# Patient Record
Sex: Female | Born: 2000 | Race: White | Hispanic: No | Marital: Single | Smoking: Never smoker
Health system: Southern US, Community
[De-identification: ages and names within clinical notes are randomized; demographics above are authoritative.]

## PROBLEM LIST (undated history)

## (undated) ENCOUNTER — Emergency Department: Payer: PRIVATE HEALTH INSURANCE | Source: Home / Self Care

## (undated) DIAGNOSIS — F323 Major depressive disorder, single episode, severe with psychotic features: Secondary | ICD-10-CM

## (undated) DIAGNOSIS — F419 Anxiety disorder, unspecified: Secondary | ICD-10-CM

## (undated) DIAGNOSIS — F429 Obsessive-compulsive disorder, unspecified: Secondary | ICD-10-CM

## (undated) DIAGNOSIS — F1220 Cannabis dependence, uncomplicated: Secondary | ICD-10-CM

## (undated) DIAGNOSIS — F32A Depression, unspecified: Secondary | ICD-10-CM

## (undated) DIAGNOSIS — F329 Major depressive disorder, single episode, unspecified: Secondary | ICD-10-CM

## (undated) DIAGNOSIS — T7840XA Allergy, unspecified, initial encounter: Secondary | ICD-10-CM

## (undated) DIAGNOSIS — F4010 Social phobia, unspecified: Secondary | ICD-10-CM

---
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
## 2018-03-03 ENCOUNTER — Other Ambulatory Visit: Payer: Self-pay

## 2018-03-03 ENCOUNTER — Encounter: Payer: Self-pay | Admitting: *Deleted

## 2018-03-03 ENCOUNTER — Encounter: Payer: Self-pay | Admitting: Psychiatry

## 2018-03-03 ENCOUNTER — Inpatient Hospital Stay
Admission: AD | Admit: 2018-03-03 | Discharge: 2018-03-08 | DRG: 885 | Disposition: A | Discharge status: Home / Self Care | Payer: 59 | Source: Ambulatory Visit | Attending: Psychiatry | Admitting: Psychiatry

## 2018-03-03 ENCOUNTER — Emergency Department
Admission: EM | Admit: 2018-03-03 | Discharge: 2018-03-03 | Disposition: A | Discharge status: Other Acute Inpatient Hospital | Payer: Managed Care, Other (non HMO) | Attending: Emergency Medicine | Admitting: Emergency Medicine

## 2018-03-03 DIAGNOSIS — F4310 Post-traumatic stress disorder, unspecified: Secondary | ICD-10-CM | POA: Diagnosis present

## 2018-03-03 DIAGNOSIS — R45850 Homicidal ideations: Secondary | ICD-10-CM | POA: Diagnosis present

## 2018-03-03 DIAGNOSIS — Z818 Family history of other mental and behavioral disorders: Secondary | ICD-10-CM | POA: Diagnosis not present

## 2018-03-03 DIAGNOSIS — F332 Major depressive disorder, recurrent severe without psychotic features: Secondary | ICD-10-CM

## 2018-03-03 DIAGNOSIS — Z23 Encounter for immunization: Secondary | ICD-10-CM | POA: Diagnosis not present

## 2018-03-03 DIAGNOSIS — F1220 Cannabis dependence, uncomplicated: Secondary | ICD-10-CM | POA: Diagnosis present

## 2018-03-03 DIAGNOSIS — Z915 Personal history of self-harm: Secondary | ICD-10-CM | POA: Diagnosis not present

## 2018-03-03 DIAGNOSIS — R45851 Suicidal ideations: Secondary | ICD-10-CM | POA: Diagnosis present

## 2018-03-03 DIAGNOSIS — F429 Obsessive-compulsive disorder, unspecified: Secondary | ICD-10-CM | POA: Diagnosis present

## 2018-03-03 DIAGNOSIS — F4010 Social phobia, unspecified: Secondary | ICD-10-CM | POA: Diagnosis present

## 2018-03-03 LAB — COMPREHENSIVE METABOLIC PANEL
ALK PHOS: 53 U/L (ref 38–126)
ALT: 13 U/L (ref 0–44)
ANION GAP: 9 (ref 5–15)
AST: 17 U/L (ref 15–41)
Albumin: 4.5 g/dL (ref 3.5–5.0)
BUN: 11 mg/dL (ref 6–20)
CALCIUM: 9.4 mg/dL (ref 8.9–10.3)
CO2: 23 mmol/L (ref 22–32)
CREATININE: 0.71 mg/dL (ref 0.44–1.00)
Chloride: 106 mmol/L (ref 98–111)
Glucose, Bld: 101 mg/dL — ABNORMAL HIGH (ref 70–99)
Potassium: 3.8 mmol/L (ref 3.5–5.1)
Sodium: 138 mmol/L (ref 135–145)
Total Bilirubin: 1.1 mg/dL (ref 0.3–1.2)
Total Protein: 7.5 g/dL (ref 6.5–8.1)

## 2018-03-03 LAB — CBC
HCT: 41.9 % (ref 36.0–46.0)
Hemoglobin: 14.4 g/dL (ref 12.0–15.0)
MCH: 28.6 pg (ref 26.0–34.0)
MCHC: 34.4 g/dL (ref 30.0–36.0)
MCV: 83.1 fL (ref 80.0–100.0)
PLATELETS: 226 10*3/uL (ref 150–400)
RBC: 5.04 MIL/uL (ref 3.87–5.11)
RDW: 12.9 % (ref 11.5–15.5)
WBC: 9.7 10*3/uL (ref 4.0–10.5)
nRBC: 0 % (ref 0.0–0.2)

## 2018-03-03 LAB — ETHANOL

## 2018-03-03 LAB — SALICYLATE LEVEL

## 2018-03-03 LAB — ACETAMINOPHEN LEVEL: Acetaminophen (Tylenol), Serum: <10 ug/mL — ABNORMAL LOW (ref 10–30)

## 2018-03-03 MED ORDER — FLUVOXAMINE MALEATE 50 MG PO TABS
50.0000 mg | ORAL_TABLET | Freq: Every day | ORAL | Status: DC
  Administered 2018-03-03: 50 mg via ORAL
  Filled 2018-03-03: qty 1

## 2018-03-03 MED ORDER — TRAZODONE HCL 100 MG PO TABS
100.0000 mg | ORAL_TABLET | Freq: Every evening | ORAL | Status: DC | PRN
  Administered 2018-03-05 – 2018-03-06 (×2): 100 mg via ORAL
  Filled 2018-03-03 (×2): qty 1

## 2018-03-03 MED ORDER — MAGNESIUM HYDROXIDE 400 MG/5ML PO SUSP: 30.0000 mL | Freq: Every day | ORAL | Status: DC | PRN

## 2018-03-03 MED ORDER — ALUM & MAG HYDROXIDE-SIMETH 200-200-20 MG/5ML PO SUSP: 30.0000 mL | ORAL | Status: DC | PRN

## 2018-03-03 MED ORDER — ACETAMINOPHEN 325 MG PO TABS
650.0000 mg | ORAL_TABLET | Freq: Four times a day (QID) | ORAL | Status: DC | PRN
  Administered 2018-03-04 (×2): 650 mg via ORAL
  Filled 2018-03-03 (×2): qty 2

## 2018-03-03 MED ORDER — HYDROXYZINE HCL 50 MG PO TABS
50.0000 mg | ORAL_TABLET | Freq: Three times a day (TID) | ORAL | Status: DC | PRN
  Administered 2018-03-04 – 2018-03-07 (×5): 50 mg via ORAL
  Filled 2018-03-03 (×6): qty 1

## 2018-03-03 NOTE — ED Notes (Signed)
Nurse talked to patient and she is nervous, talks very low, Patient states that she is having Si thoughts, but no plan, she said her mom is supportive and it is ok to talk with her about her treatment. Patient denies Hi or avh.

## 2018-03-03 NOTE — ED Notes (Signed)
Mom left phone number [phone] Shannon Bouska.

## 2018-03-03 NOTE — Progress Notes (Addendum)
New admit from home IVC ed due to reports of SI/HI with out a plan , had severe depression and anxiety that is getting worse, lacking sleep for the last couple of days, patient endorse using marijuana before this admission. Patient is calm and cooperative stable and responding well, unit guide lines and expected behaviors discussed, unit safety and rounding explained, room orientation complete , hygiene product provided and medication given. Body search and skin check done by two nurses, no contraband found and skin is clean, bruise noted left upper arm, safety is maintained..patient denies any SI/HI and no AVH noted at this time.

## 2018-03-03 NOTE — Tx Team (Signed)
Initial Treatment Plan 03/03/2018 10:28 PM Robin Hess [id number]    PATIENT STRESSORS: Financial difficulties Health problems Occupational concerns Substance abuse   PATIENT STRENGTHS: Capable of independent living Communication skills Motivation for treatment/growth Supportive family/friends   PATIENT IDENTIFIED PROBLEMS:   Reports Si/HI without a plan    Depression/Anxiety    Lack of sleep     Financial Issues       DISCHARGE CRITERIA:  Improved stabilization in mood, thinking, and/or behavior Motivation to continue treatment in a less acute level of care Reduction of life-threatening or endangering symptoms to within safe limits Safe-care adequate arrangements made  PRELIMINARY DISCHARGE PLAN: Outpatient therapy Participate in family therapy Placement in alternative living arrangements Return to previous living arrangement  PATIENT/FAMILY INVOLVEMENT: This treatment plan has been presented to and reviewed with the patient, Robin Hess,  The patient have been given the opportunity to ask questions and make suggestions.  Lelan Pons, RN 03/03/2018, 10:28 PM

## 2018-03-03 NOTE — ED Notes (Signed)
Mother took pt's clothes

## 2018-03-03 NOTE — ED Notes (Signed)
Pt unable to void at this time. 

## 2018-03-03 NOTE — ED Notes (Signed)
Patient transferred from triage to room 23, no signs of distress.
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
## 2018-03-03 NOTE — ED Notes (Signed)
Pt. Sitting on edge of bed.  Spoke with patient about EKG, pt. Fine with this test.

## 2018-03-03 NOTE — ED Provider Notes (Addendum)
[facility] Emergency Department Provider Note  ____________________________________________   I have reviewed the triage vital signs and the nursing notes. Where available I have reviewed prior notes and, if possible and indicated, outside hospital notes.    HISTORY  Chief Complaint Behavior Problem    HPI Robin Hess is a 17 y.o. female  History of cutting behavior presents today complaining of self harm thoughts.  He denies recent cutting, she denies overdose, she denies sexual or physical abuse.  She states she is depressed and wants to hurt herself.  No other alleviating or aggravating factors, is been going on for some time she states, no other prior treatment, sent here by her counselor she states   L   Past Medical History:  Diagnosis Date  . Allergy   . Anxiety   . Depression     Patient Active Problem List   Diagnosis Date Noted  . Severe recurrent major depression without psychotic features ([facility]) 03/03/2018  . Social anxiety disorder 05/01/2014  . Post traumatic stress disorder (PTSD) 05/01/2014  . Severe major depression, single episode, with psychotic features ([facility]) 04/28/2014  . 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 SORE THROAT 01/04/2010  . CELLULITIS AND ABSCESS OF BUTTOCK 12/06/2009    No past surgical history on file.  Prior to Admission medications   Not on File    Allergies Amoxicillin  No family history on file.  Social History Social History   Tobacco Use  . Smoking status: Never Smoker  . Smokeless tobacco: Never Used  Substance Use Topics  . Alcohol use: No  . Drug use: No    Review of Systems Constitutional: No fever/chills Eyes: No visual changes. ENT: No sore throat. No stiff neck no neck pain Cardiovascular: Denies chest pain. Respiratory: Denies shortness of breath. Gastrointestinal:   no vomiting.  No diarrhea.  No constipation. Genitourinary: Negative for dysuria. Musculoskeletal: Negative lower  extremity swelling Skin: Negative for rash. Neurological: Negative for severe headaches, focal weakness or numbness.   ____________________________________________   PHYSICAL EXAM:  VITAL SIGNS: ED Triage Vitals  Enc Vitals Group     BP 03/03/18 1710 118/74     Pulse Rate 03/03/18 1710 78     Resp 03/03/18 1710 18     Temp 03/03/18 1710 98.2 F (36.8 C)     Temp Source 03/03/18 1710 Oral     SpO2 03/03/18 1710 97 %     Weight 03/03/18 1712 140 lb (63.5 kg)     Height 03/03/18 1712 5\' 11"  (1.803 m)     Head Circumference --      Peak Flow --      Pain Score 03/03/18 1711 4     Pain Loc --      Pain Edu? --      Excl. in GC? --     Constitutional: Alert and oriented. Well appearing and in no acute distress. Eyes: Conjunctivae are normal Head: Atraumatic HEENT: No congestion/rhinnorhea. Mucous membranes are moist.  Oropharynx non-erythematous Neck:   Nontender with no meningismus, no masses, no stridor Cardiovascular: Normal rate, regular rhythm. Grossly normal heart sounds.  Good peripheral circulation. Respiratory: Normal respiratory effort.  No retractions. Lungs CTAB. Abdominal: Soft and nontender. No distention. No guarding no rebound Back:  There is no focal tenderness or step off.  there is no midline tenderness there are no lesions noted. there is no CVA tenderness Musculoskeletal: No lower extremity tenderness, no upper extremity tenderness. No joint effusions, no DVT signs  strong distal pulses no edema Neurologic:  Normal speech and language. No gross focal neurologic deficits are appreciated.  Skin:  Skin is warm, dry and intact. No rash noted. Psychiatric: Mood and affect are anxious tractable and responds to my questions.  Physical exam with chaperone in the room  ____________________________________________   LABS (all labs ordered are listed, but only abnormal results are displayed)  Labs Reviewed  COMPREHENSIVE METABOLIC PANEL - Abnormal; Notable for the  following components:      Result Value   Glucose, Bld 101 (*)    All other components within normal limits  ACETAMINOPHEN LEVEL - Abnormal; Notable for the following components:   Acetaminophen (Tylenol), Serum <10 (*)    All other components within normal limits  ETHANOL  SALICYLATE LEVEL  CBC  URINE DRUG SCREEN, QUALITATIVE ([facility] ONLY)  POC URINE PREG, ED    Pertinent labs  results that were available during my care of the patient were reviewed by me and considered in my medical decision making (see chart for details). ____________________________________________  EKG  I personally interpreted any EKGs ordered by me or triage Sinus rhythm, EKG was ordered by psychiatrist, no acute ST elevation or depression nonspecific ST changes normal axis ____________________________________________  RADIOLOGY  Pertinent labs & imaging results that were available during my care of the patient were reviewed by me and considered in my medical decision making (see chart for details). If possible, patient and/or family made aware of any abnormal findings.  No results found. ____________________________________________    PROCEDURES  Procedure(s) performed: None  Procedures  Critical Care performed: None  ____________________________________________   INITIAL IMPRESSION / ASSESSMENT AND PLAN / ED COURSE  Pertinent labs & imaging results that were available during my care of the patient were reviewed by me and considered in my medical decision making (see chart for details).  Patient here with thoughts of harming herself, she has been evaluated by psychiatry and they are taking an IVC paperwork on her, disposition will be pending psychiatric evaluation and disposition.  Medically no acute issues noted thus far   ----------------------------------------- 10:56 PM on 03/03/2018 -----------------------------------------  Note, patient was admitted to the psychiatric service prior to  giving [place] a urine sample we have called down there to let them know that we do not know her pregnancy status or her urine sample and they will follow-up with psychiatry for that   ____________________________________________   FINAL CLINICAL IMPRESSION(S) / ED DIAGNOSES  Final diagnoses:  None      This chart was dictated using voice recognition software.  Despite best efforts to proofread,  errors can occur which can change meaning.      Jeanmarie Plant, MD 03/03/18 [id number]    Jeanmarie Plant, MD 03/03/18 2256

## 2018-03-03 NOTE — ED Notes (Signed)
Nurse talked with mom and Patient had [facility], number is [phone] Jeanice Lim)

## 2018-03-03 NOTE — ED Notes (Signed)
Patient received supper tray and beverage.  

## 2018-03-03 NOTE — ED Notes (Signed)
Dr. Toni Amend is talking with Patient, no signs of distress, Patient is calm and cooperative.

## 2018-03-04 DIAGNOSIS — F1220 Cannabis dependence, uncomplicated: Secondary | ICD-10-CM | POA: Diagnosis present

## 2018-03-04 DIAGNOSIS — F429 Obsessive-compulsive disorder, unspecified: Secondary | ICD-10-CM | POA: Diagnosis present

## 2018-03-04 DIAGNOSIS — F332 Major depressive disorder, recurrent severe without psychotic features: Principal | ICD-10-CM

## 2018-03-04 LAB — URINE DRUG SCREEN, QUALITATIVE (ARMC ONLY)
Amphetamines, Ur Screen: NOT DETECTED
BARBITURATES, UR SCREEN: NOT DETECTED
BENZODIAZEPINE, UR SCRN: NOT DETECTED
Cannabinoid 50 Ng, Ur ~~LOC~~: NOT DETECTED
Cocaine Metabolite,Ur ~~LOC~~: NOT DETECTED
MDMA (ECSTASY) UR SCREEN: NOT DETECTED
Methadone Scn, Ur: NOT DETECTED
Opiate, Ur Screen: NOT DETECTED
Phencyclidine (PCP) Ur S: NOT DETECTED
TRICYCLIC, UR SCREEN: NOT DETECTED

## 2018-03-04 LAB — LIPID PANEL
CHOL/HDL RATIO: 3.1 ratio
Cholesterol: 153 mg/dL (ref 0–169)
HDL: 49 mg/dL (ref >40)
LDL CALC: 90 mg/dL (ref 0–99)
Triglycerides: 70 mg/dL (ref <150)
VLDL: 14 mg/dL (ref 0–40)

## 2018-03-04 LAB — HCG, QUANTITATIVE, PREGNANCY

## 2018-03-04 LAB — TSH: TSH: 0.897 u[IU]/mL (ref 0.350–4.500)

## 2018-03-04 LAB — HEMOGLOBIN A1C
Hgb A1c MFr Bld: 4.9 % (ref 4.8–5.6)
MEAN PLASMA GLUCOSE: 93.93 mg/dL

## 2018-03-04 LAB — GLUCOSE, CAPILLARY: Glucose-Capillary: 113 mg/dL — ABNORMAL HIGH (ref 70–99)

## 2018-03-04 MED ORDER — FLUVOXAMINE MALEATE 50 MG PO TABS
100.0000 mg | ORAL_TABLET | Freq: Every day | ORAL | Status: DC
  Administered 2018-03-04 – 2018-03-07 (×4): 100 mg via ORAL
  Filled 2018-03-04 (×4): qty 2

## 2018-03-04 MED ORDER — INFLUENZA VAC SPLIT QUAD 0.5 ML IM SUSY
0.5000 mL | PREFILLED_SYRINGE | INTRAMUSCULAR | Status: AC
  Administered 2018-03-05: 0.5 mL via INTRAMUSCULAR
  Filled 2018-03-04: qty 0.5

## 2018-03-04 MED ORDER — PRAZOSIN HCL 2 MG PO CAPS
2.0000 mg | ORAL_CAPSULE | Freq: Once | ORAL | Status: AC
  Administered 2018-03-04: 2 mg via ORAL
  Filled 2018-03-04: qty 1

## 2018-03-04 NOTE — [facility] Group Notes (Signed)
LCSW Group Therapy Note  03/04/2018  1:00pm  Type of Therapy/Topic:  Group Therapy:  Balance in Life  Participation Level:  Did Attend  Description of Group:    This group will address the concept of balance and how it feels and looks when one is unbalanced. Patients will be encouraged to process areas in their lives that are out of balance and identify reasons for remaining unbalanced. Facilitators will guide patients in utilizing problem-solving interventions to address and correct the stressor making their life unbalanced. Understanding and applying boundaries will be explored and addressed for obtaining and maintaining a balanced life. Patients will be encouraged to explore ways to assertively make their unbalanced needs known to significant others in their lives, using other group members and facilitator for support and feedback.  Therapeutic Goals: 1. Patient will identify two or more emotions or situations they have that consume much of in their lives. 2. Patient will identify signs/triggers that life has become out of balance:  3. Patient will identify two ways to set boundaries in order to achieve balance in their lives:  4. Patient will demonstrate ability to communicate their needs through discussion and/or role plays  Summary of Patient Progress:  This patient as a group collective was able to identify several factors that contribute to unbalanced life such as :No sleep, stopping medications, not exercising ,isolating from friends and family, drugs and alcohol abuse and use. In the next portion of group patient was encouraged to talk about things that contribute to balance  their life such as rest, harmony, seeing family, exercise,setting small goals, creating lists,keeping medical appointments, taking medications.  Therapeutic Modalities:   Cognitive Behavioral Therapy Solution-Focused Therapy Assertiveness Training  Marti Acebo M, LCSW 03/04/2018 2:07 PM     LCSW Group Therapy Note  03/04/2018  1:00pm  Type of Therapy/Topic:  Group Therapy:  Balance in Life  Participation Level:  Did Attend  Description of Group:    This group will address the concept of balance and how it feels and looks when one is unbalanced. Patients will be encouraged to process areas in their lives that are out of balance and identify reasons for remaining unbalanced. Facilitators will guide patients in utilizing problem-solving interventions to address and correct the stressor making their life unbalanced. Understanding and applying boundaries will be explored and addressed for obtaining and maintaining a balanced life. Patients will be encouraged to explore ways to assertively make their unbalanced needs known to significant others in their lives, using other group members and facilitator for support and feedback.  Therapeutic Goals: 1. Patient will identify two or more emotions or situations they have that consume much of in their lives. 2. Patient will identify signs/triggers that life has become out of balance:  3. Patient will identify two ways to set boundaries in order to achieve balance in their lives:  4. Patient will demonstrate ability to communicate their needs through discussion and/or role plays  Summary of Patient Progress:  This patient as a group collective was able to identify several factors that contribute to unbalanced life such as :No sleep, stopping medications, not exercising ,isolating from friends and family, drugs and alcohol abuse and use. In the next portion of group patient was encouraged to talk about things that contribute to balance  their life such as rest, harmony, seeing family, exercise,setting small goals, creating lists,keeping medical appointments, taking medications.  Therapeutic Modalities:   Cognitive Behavioral Therapy Solution-Focused Therapy Assertiveness Training  Cheron Schaumann, [place] 03/04/2018 2:07 PM

## 2018-03-04 NOTE — Plan of Care (Signed)
Patient denies SI/HI/AVH at this time and states to this writer that her depression is a "7/10" and her anxiety is a "9/10". Patient states that "my thoughts" and being "panicky" is what's making her feel this way. Patient has the ability to identify the available resources that can assist her in meeting her health-care needs as well as the changes in her lifestyle that can reduce the recurrence of her condition. Patient has not experienced any health related complications thus far and has been free from injury on the unit thus far. Patient verbalizes understanding of her prescribed therapeutic regimen and has not voiced any further questions or concerns at this time. Patient remains safe on the unit at this time.  Problem: Self-Concept: Goal: Ability to disclose and discuss suicidal ideas will improve Outcome: Progressing Goal: Will verbalize positive feelings about self Outcome: Progressing   Problem: Health Behavior/Discharge Planning: Goal: Ability to identify changes in lifestyle to reduce recurrence of condition will improve Outcome: Progressing Goal: Identification of resources available to assist in meeting health care needs will improve Outcome: Progressing   Problem: Physical Regulation: Goal: Complications related to the disease process, condition or treatment will be avoided or minimized Outcome: Progressing   Problem: Safety: Goal: Ability to remain free from injury will improve Outcome: Progressing   Problem: Education: Goal: Utilization of techniques to improve thought processes will improve Outcome: Progressing Goal: Knowledge of the prescribed therapeutic regimen will improve Outcome: Progressing   Problem: Safety: Goal: Ability to disclose and discuss suicidal ideas will improve Outcome: Progressing Goal: Ability to identify and utilize support systems that promote safety will improve Outcome: Progressing   Problem: Activity: Goal: Will identify at least one  activity in which they can participate Outcome: Progressing   Problem: Self-Concept: Goal: Will verbalize positive feelings about self Outcome: Progressing

## 2018-03-04 NOTE — Progress Notes (Signed)
[facility] MD Progress Note  69/45/[id number] 82:80 AM Robin Hess  MRN:  [id number]  Subjective:   Robin Hess met with treatment team today. She still feels and looks very anxious and shaky but is able to maintain her composure and answer our questions. The twitch in her lip is gone and she does look a little more relaxed today.  She tolerated medications well but felt a little nauseated this morning. Ate little breakfast with encouragement. She felt dizzy from 2 mg of minipress. She still wants to try it. Will give one time 1 mg today. She is encouraged to participate on programming.   Principal Problem: Severe recurrent major depression without psychotic features ([place]) Diagnosis:   Patient Active Problem List   Diagnosis Date Noted  . Severe recurrent major depression without psychotic features ([place]) [F33.2] 03/03/2018    Priority: High  . Cannabis use disorder, moderate, dependence ([place]) [F12.20] 03/04/2018  . OCD (obsessive compulsive disorder) [F42.9] 03/04/2018  . Social anxiety disorder [F40.10] 05/01/2014  . Post traumatic stress disorder (PTSD) [F43.10] 05/01/2014  . Severe major depression, single episode, with psychotic features ([place]) [F32.3] 04/28/2014  . 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 SORE THROAT [J02.9] 01/04/2010  . CELLULITIS AND ABSCESS OF BUTTOCK [L02.31, L03.317] 12/06/2009   Total Time spent with patient: 20 minutes  Past Psychiatric History: depression, anxiety  Past Medical History:  Past Medical History:  Diagnosis Date  . Allergy   . Anxiety   . Depression    History reviewed. No pertinent surgical history. Family History: History reviewed. No pertinent family history. Family Psychiatric  History: depression, bipolar Social History:  Social History   Substance and Sexual Activity  Alcohol Use No     Social History   Substance and Sexual Activity  Drug Use No    Social History   Socioeconomic History  . Marital status: Single    Spouse name: Not  on file  . Number of children: Not on file  . Years of education: Not on file  . Highest education level: Not on file  Occupational History  . Not on file  Social Needs  . Financial resource strain: Not on file  . Food insecurity:    Worry: Not on file    Inability: Not on file  . Transportation needs:    Medical: Not on file    Non-medical: Not on file  Tobacco Use  . Smoking status: Never Smoker  . Smokeless tobacco: Never Used  Substance and Sexual Activity  . Alcohol use: No  . Drug use: No  . Sexual activity: Never    Birth control/protection: None  Lifestyle  . Physical activity:    Days per week: Not on file    Minutes per session: Not on file  . Stress: Not on file  Relationships  . Social connections:    Talks on phone: Not on file    Gets together: Not on file    Attends religious service: Not on file    Active member of club or organization: Not on file    Attends meetings of clubs or organizations: Not on file    Relationship status: Not on file  Other Topics Concern  . Not on file  Social History Narrative  . Not on file   Additional Social History:                         Sleep: Fair  Appetite:  Fair  Current Medications:  Current Facility-Administered Medications  Medication Dose Route Frequency Provider Last Rate Last Dose  . acetaminophen (TYLENOL) tablet 650 mg  650 mg Oral Q6H PRN Clapacs, Madie Reno, MD   650 mg at 03/04/18 1706  . alum & mag hydroxide-simeth (MAALOX/MYLANTA) 200-200-20 MG/5ML suspension 30 mL  30 mL Oral Q4H PRN Clapacs, [name], MD      . fluvoxaMINE (LUVOX) tablet 100 mg  100 mg Oral QHS Kosta Schnitzler B, MD   100 mg at 03/04/18 2111  . hydrOXYzine (ATARAX/VISTARIL) tablet 50 mg  50 mg Oral TID PRN Clapacs, Madie Reno, MD   50 mg at 03/05/18 0853  . Influenza vac split quadrivalent PF (FLUARIX) injection 0.5 mL  0.5 mL Intramuscular Tomorrow-1000 Dashonda Bonneau B, MD      . lamoTRIgine (LAMICTAL) tablet 25 mg  25  mg Oral QHS Yehia Mcbain B, MD      . magnesium hydroxide (MILK OF MAGNESIA) suspension 30 mL  30 mL Oral Daily PRN Clapacs, [name], MD      . prazosin (MINIPRESS) capsule 1 mg  1 mg Oral Once Yarelli Decelles B, MD      . traZODone (DESYREL) tablet 100 mg  100 mg Oral QHS PRN Clapacs, Madie Reno, MD        Lab Results:  Results for orders placed or performed during the hospital encounter of 03/03/18 (from the past 48 hour(s))  Hemoglobin A1c     Status: None   Collection Time: 03/03/18  5:16 PM  Result Value Ref Range   Hgb A1c MFr Bld 4.9 4.8 - 5.6 %    Comment: (NOTE) Pre diabetes:          5.7%-6.4% Diabetes:              >6.4% Glycemic control for   <7.0% adults with diabetes    Mean Plasma Glucose 93.93 mg/dL    Comment: Performed at [facility] [address], [place], [place] [address]  Lipid panel     Status: None   Collection Time: 03/03/18  5:16 PM  Result Value Ref Range   Cholesterol 153 0 - 169 mg/dL   Triglycerides 70 <150 mg/dL   HDL 49 >40 mg/dL   Total CHOL/HDL Ratio 3.1 RATIO   VLDL 14 0 - 40 mg/dL   LDL Cholesterol 90 0 - 99 mg/dL    Comment:        Total Cholesterol/HDL:CHD Risk Coronary Heart Disease Risk Table                     Men   Women  1/2 Average Risk   3.4   3.3  Average Risk       5.0   4.4  2 X Average Risk   9.6   7.1  3 X Average Risk  23.4   11.0        Use the calculated Patient Ratio above and the CHD Risk Table to determine the patient's CHD Risk.        ATP III CLASSIFICATION (LDL):  <100     mg/dL   Optimal  100-129  mg/dL   Near or Above                    Optimal  130-159  mg/dL   Borderline  160-189  mg/dL   High  >190     mg/dL   Very High Performed at [facility], [address] [place], [place] [address]   TSH     Status: None   Collection Time: 03/03/18  5:16 PM  Result Value Ref Range   TSH 0.897 0.350 - 4.500 uIU/mL    Comment: Performed by a 3rd Generation assay with a functional  sensitivity of <=0.01 uIU/mL. Performed at [facility], [address] [place], [place] [address]   hCG, quantitative, pregnancy     Status: None   Collection Time: 03/03/18  5:16 PM  Result Value Ref Range   hCG, Beta Chain, Quant, S <1 <5 mIU/mL    Comment:          GEST. AGE      CONC.  (mIU/mL)   <=1 WEEK        5 - 50     2 WEEKS       50 - 500     3 WEEKS       100 - 10,000     4 WEEKS     1,000 - 30,000     5 WEEKS     3,500 - 115,000   6-8 WEEKS     12,000 - 270,000    12 WEEKS     15,000 - 220,000        FEMALE AND NON-PREGNANT FEMALE:     LESS THAN 5 mIU/mL Performed at [facility], [address] [place], [place] [address]   Glucose, capillary     Status: Abnormal   Collection Time: 03/04/18  4:30 PM  Result Value Ref Range   Glucose-Capillary 113 (H) 70 - 99 mg/dL    Blood Alcohol level:  Lab Results  Component Value Date   ETH <10 03/03/2018   ETH <11 60/63/0160    Metabolic Disorder Labs: Lab Results  Component Value Date   HGBA1C 4.9 03/03/2018   MPG 93.93 03/03/2018   Lab Results  Component Value Date   PROLACTIN 17.5 05/02/2014   Lab Results  Component Value Date   CHOL 153 03/03/2018   TRIG 70 03/03/2018   HDL 49 03/03/2018   CHOLHDL 3.1 03/03/2018   VLDL 14 03/03/2018   LDLCALC 90 03/03/2018   LDLCALC 89 05/02/2014    Physical Findings: AIMS: Facial and Oral Movements Muscles of Facial Expression: None, normal Lips and Perioral Area: None, normal Jaw: None, normal Tongue: None, normal,Extremity Movements Upper (arms, wrists, hands, fingers): None, normal Lower (legs, knees, ankles, toes): None, normal, Trunk Movements Neck, shoulders, hips: None, normal, Overall Severity Severity of abnormal movements (highest score from questions above): None, normal Incapacitation due to abnormal movements: None, normal Patient's awareness of abnormal movements (rate only patient's report): No Awareness, Dental  Status Current problems with teeth and/or dentures?: No Does patient usually wear dentures?: No  CIWA:  CIWA-Ar Total: 0 COWS:  COWS Total Score: 2  Musculoskeletal: Strength & Muscle Tone: within normal limits Gait & Station: normal Patient leans: N/A  Psychiatric Specialty Exam: Physical Exam  Nursing note and vitals reviewed. Psychiatric: Her mood appears anxious. Her affect is blunt. Her speech is delayed and slurred. She is withdrawn. Cognition and memory are impaired. She expresses impulsivity. She exhibits a depressed mood. She expresses suicidal ideation.    Review of Systems  Neurological: Positive for tremors.  Psychiatric/Behavioral: Positive for depression and suicidal ideas. The patient is nervous/anxious.   All other systems reviewed and are negative.   Blood pressure 102/72, pulse 97, temperature 98.5 F (36.9 C), temperature source Oral, resp.  rate 18, height 5' 7"  (1.702 m), weight 64 kg, last menstrual period 02/10/2018, SpO2 99 %.Body mass index is 22.08 kg/m.  General Appearance: Casual  Eye Contact:  Good  Speech:  Slow and Slurred  Volume:  Decreased  Mood:  Anxious and Depressed  Affect:  Blunt  Thought Process:  Goal Directed and Descriptions of Associations: Intact  Orientation:  Full (Time, Place, and Person)  Thought Content:  WDL  Suicidal Thoughts:  Yes.  without intent/plan  Homicidal Thoughts:  No  Memory:  Immediate;   Fair Recent;   Fair Remote;   Fair  Judgement:  Impaired  Insight:  Shallow  Psychomotor Activity:  Psychomotor Retardation  Concentration:  Concentration: Poor and Attention Span: Poor  Recall:  Poor  Fund of Knowledge:  Fair  Language:  Poor  Akathisia:  No  Handed:  Right  AIMS (if indicated):     Assets:  Communication Skills Desire for Improvement Financial Resources/Insurance Housing Physical Health Resilience Social Support  ADL's:  Intact  Cognition:  WNL  Sleep:  Number of Hours: 7     Treatment Plan  Summary: Daily contact with patient to assess and evaluate symptoms and progress in treatment and Medication management   Robin Hess is an 17 year old female with a history of depression and incapacitating anxiety admitted for suicidal and homicidal ideation.  #Suicidal ideation -patient is able to contract for safety  #Mood -continue Luvox to 100 mg nightly -Trazodone 100 mg nightly -start Lamictal 25 mg tonight for mood stabilization  #Anxiety, PTSD and OCD type -try Minipress 1 mg once to test tolerability  #Poor intale -Ensure  #Labs -lipid panel, TSH, A1C -EKG -pregnancy test  #Cannabis abuse -patient minimizes problems and declines treatment  #Disposition -discharge to home with family -follow up with [facility]   Orson Slick, MD 03/05/2018, 11:35 AM

## 2018-03-04 NOTE — H&P (Signed)
Psychiatric Admission Assessment Adult  Patient Identification: Robin Hess MRN:  [id number] Date of Evaluation:  03/04/2018 Chief Complaint:  Depression Principal Diagnosis: Severe recurrent major depression without psychotic features [facility]) Diagnosis:   Patient Active Problem List   Diagnosis Date Noted  . Severe recurrent major depression without psychotic features (HCC) [F33.2] 03/03/2018    Priority: High  . Cannabis use disorder, moderate, dependence (HCC) [F12.20] 03/04/2018  . Social anxiety disorder [F40.10] 05/01/2014  . Post traumatic stress disorder (PTSD) [F43.10] 05/01/2014  . Severe major depression, single episode, with psychotic features (HCC) [F32.3] 04/28/2014  . 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 SORE THROAT [J02.9] 01/04/2010  . CELLULITIS AND ABSCESS OF BUTTOCK [L02.31, L03.317] 12/06/2009   History of Present Illness:   Identifying data. Robin Hess is an 17 year old female with a history of depression and anxiety.  Chief complaint. "I went to therapy."  History of present illness. Information was obtained from the patient and the chart. The patient came to the ER with her mother from her therapist office after she disclosed suicidal and homicidal thoughts. This was her first appointment with the therapist. Her depression has been getting worse over several months for no apparent reason. She has not been taking any medications lately. She reports poor sleep, depressed appetite, anhedonia, feeling of guilt hopelessness worthlessness, poor energy and concentration, social isolation, crying spells. She experiences frequent and intrusive suicidal thoughts. She denies hallucinations but frequently feels paranoid. She never leaves the house without a chaperone. She does not have driver's license. She has infrequent panic attacks lasting minutes, severe social anxiety, nightmares and flashbacks of PTSD. She does not share causes. She has obsessions and compulsions with  checking behaviors. She complains of profound "minute to minute" mood swings and getting angry easily. She does not drink alcohol but uses cannabis.  Past psychiatric history. One prior hospitalization for depression and anxiety treated with Zoloft and Risperdal. History of cutting, last time several months ago in the leg. No suicide attempts.  Family psychiatric history. Mother with depression, father possibly bipolar.  Social history. Dropped out of school in the tenth grade due to anxiety. Lives with her parents. Unemployed.  Total Time spent with patient: 1 hour  Is the patient at risk to self? Yes.    Has the patient been a risk to self in the past 6 months? No.  Has the patient been a risk to self within the distant past? No.  Is the patient a risk to others? Yes.    Has the patient been a risk to others in the past 6 months? No.  Has the patient been a risk to others within the distant past? No.   Prior Inpatient Therapy:   Prior Outpatient Therapy:    Alcohol Screening: 1. How often do you have a drink containing alcohol?: Monthly or less 2. How many drinks containing alcohol do you have on a typical day when you are drinking?: 1 or 2 3. How often do you have six or more drinks on one occasion?: Less than monthly AUDIT-C Score: 2 4. How often during the last year have you found that you were not able to stop drinking once you had started?: Less than monthly 5. How often during the last year have you failed to do what was normally expected from you becasue of drinking?: Never 6. How often during the last year have you needed a first drink in the morning to get yourself going after a heavy drinking session?: Never 7.  How often during the last year have you had a feeling of guilt of remorse after drinking?: Never 8. How often during the last year have you been unable to remember what happened the night before because you had been drinking?: Never 9. Have you or someone else been  injured as a result of your drinking?: No 10. Has a relative or friend or a doctor or another health worker been concerned about your drinking or suggested you cut down?: No Alcohol Use Disorder Identification Test Final Score (AUDIT): 3 Intervention/Follow-up: Alcohol Education Substance Abuse History in the last 12 months:  Yes.   Consequences of Substance Abuse: Negative Previous Psychotropic Medications: Yes  Psychological Evaluations: No  Past Medical History:  Past Medical History:  Diagnosis Date  . Allergy   . Anxiety   . Depression    History reviewed. No pertinent surgical history. Family History: History reviewed. No pertinent family history.  Tobacco Screening: Have you used any form of tobacco in the last 30 days? (Cigarettes, Smokeless Tobacco, Cigars, and/or Pipes): No Social History:  Social History   Substance and Sexual Activity  Alcohol Use No     Social History   Substance and Sexual Activity  Drug Use No    Additional Social History: Marital status: Single Are you sexually active?: No What is your sexual orientation?: Heterosexual- not sure Has your sexual activity been affected by drugs, alcohol, medication, or emotional stress?: no Does patient have children?: No                         Allergies:   Allergies  Allergen Reactions  . Amoxicillin     REACTION: Rash   Lab Results:  Results for orders placed or performed during the hospital encounter of 03/03/18 (from the past 48 hour(s))  Urine Drug Screen, Qualitative     Status: None   Collection Time: 03/03/18 10:51 AM  Result Value Ref Range   Tricyclic, Ur Screen NONE DETECTED NONE DETECTED   Amphetamines, Ur Screen NONE DETECTED NONE DETECTED   MDMA (Ecstasy)Ur Screen NONE DETECTED NONE DETECTED   Cocaine Metabolite,Ur [place] NONE DETECTED NONE DETECTED   Opiate, Ur Screen NONE DETECTED NONE DETECTED   Phencyclidine (PCP) Ur S NONE DETECTED NONE DETECTED   Cannabinoid 50 Ng, Ur [place]  NONE DETECTED NONE DETECTED   Barbiturates, Ur Screen NONE DETECTED NONE DETECTED   Benzodiazepine, Ur Scrn NONE DETECTED NONE DETECTED   Methadone Scn, Ur NONE DETECTED NONE DETECTED    Comment: (NOTE) Tricyclics + metabolites, urine    Cutoff 1000 ng/mL Amphetamines + metabolites, urine  Cutoff 1000 ng/mL MDMA (Ecstasy), urine              Cutoff 500 ng/mL Cocaine Metabolite, urine          Cutoff 300 ng/mL Opiate + metabolites, urine        Cutoff 300 ng/mL Phencyclidine (PCP), urine         Cutoff 25 ng/mL Cannabinoid, urine                 Cutoff 50 ng/mL Barbiturates + metabolites, urine  Cutoff 200 ng/mL Benzodiazepine, urine              Cutoff 200 ng/mL Methadone, urine                   Cutoff 300 ng/mL The urine drug screen provides only a preliminary, unconfirmed analytical test result and should  not be used for non-medical purposes. Clinical consideration and professional judgment should be applied to any positive drug screen result due to possible interfering substances. A more specific alternate chemical method must be used in order to obtain a confirmed analytical result. Gas chromatography / mass spectrometry (GC/MS) is the preferred confirmat ory method. Performed at [facility], [address] [place], [place] [address]   Hemoglobin A1c     Status: None   Collection Time: 03/03/18  5:16 PM  Result Value Ref Range   Hgb A1c MFr Bld 4.9 4.8 - 5.6 %    Comment: (NOTE) Pre diabetes:          5.7%-6.4% Diabetes:              >6.4% Glycemic control for   <7.0% adults with diabetes    Mean Plasma Glucose 93.93 mg/dL    Comment: Performed at [facility], [address] [place], [place] [address]  Lipid panel     Status: None   Collection Time: 03/03/18  5:16 PM  Result Value Ref Range   Cholesterol 153 0 - 169 mg/dL   Triglycerides 70 <098 mg/dL   HDL 49 >11 mg/dL   Total CHOL/HDL Ratio 3.1 RATIO   VLDL 14 0 - 40 mg/dL   LDL Cholesterol 90  0 - 99 mg/dL    Comment:        Total Cholesterol/HDL:CHD Risk Coronary Heart Disease Risk Table                     Men   Women  1/2 Average Risk   3.4   3.3  Average Risk       5.0   4.4  2 X Average Risk   9.6   7.1  3 X Average Risk  23.4   11.0        Use the calculated Patient Ratio above and the CHD Risk Table to determine the patient's CHD Risk.        ATP III CLASSIFICATION (LDL):  <100     mg/dL   Optimal  914-782  mg/dL   Near or Above                    Optimal  130-159  mg/dL   Borderline  956-213  mg/dL   High  >086     mg/dL   Very High Performed at [facility], [address] [place], [place] [address]   TSH     Status: None   Collection Time: 03/03/18  5:16 PM  Result Value Ref Range   TSH 0.897 0.350 - 4.500 uIU/mL    Comment: Performed by a 3rd Generation assay with a functional sensitivity of <=0.01 uIU/mL. Performed at [facility], [address] [place], [place] [address]   hCG, quantitative, pregnancy     Status: None   Collection Time: 03/03/18  5:16 PM  Result Value Ref Range   hCG, Beta Chain, Quant, S <1 <5 mIU/mL    Comment:          GEST. AGE      CONC.  (mIU/mL)   <=1 WEEK        5 - 50     2 WEEKS       50 - 500     3 WEEKS       100 - 10,000     4 WEEKS  1,000 - 30,000     5 WEEKS     3,500 - 115,000   6-8 WEEKS     12,000 - 270,000    12 WEEKS     15,000 - 220,000        FEMALE AND NON-PREGNANT FEMALE:     LESS THAN 5 mIU/mL Performed at [facility], [address] [place], [place] [address]     Blood Alcohol level:  Lab Results  Component Value Date   [facility] <10 03/03/2018   ETH <11 04/28/2014    Metabolic Disorder Labs:  Lab Results  Component Value Date   HGBA1C 4.9 03/03/2018   MPG 93.93 03/03/2018   Lab Results  Component Value Date   PROLACTIN 17.5 05/02/2014   Lab Results  Component Value Date   CHOL 153 03/03/2018   TRIG 70 03/03/2018   HDL 49 03/03/2018   CHOLHDL 3.1  03/03/2018   VLDL 14 03/03/2018   LDLCALC 90 03/03/2018   LDLCALC 89 05/02/2014    Current Medications: Current Facility-Administered Medications  Medication Dose Route Frequency Provider Last Rate Last Dose  . acetaminophen (TYLENOL) tablet 650 mg  650 mg Oral Q6H PRN Clapacs, [name], MD   650 mg at 03/04/18 1055  . alum & mag hydroxide-simeth (MAALOX/MYLANTA) 200-200-20 MG/5ML suspension 30 mL  30 mL Oral Q4H PRN Clapacs, [name], MD      . fluvoxaMINE (LUVOX) tablet 100 mg  100 mg Oral QHS Laiya Wisby B, MD      . hydrOXYzine (ATARAX/VISTARIL) tablet 50 mg  50 mg Oral TID PRN Clapacs, [name], MD   50 mg at 03/04/18 1055  . [START ON 03/05/2018] Influenza vac split quadrivalent PF (FLUARIX) injection 0.5 mL  0.5 mL Intramuscular Tomorrow-1000 Paytyn Mesta B, MD      . magnesium hydroxide (MILK OF MAGNESIA) suspension 30 mL  30 mL Oral Daily PRN Clapacs, [name], MD      . prazosin (MINIPRESS) capsule 2 mg  2 mg Oral Once Lyssa Hackley B, MD      . traZODone (DESYREL) tablet 100 mg  100 mg Oral QHS PRN Clapacs, [name], MD       PTA Medications: No medications prior to admission.    Musculoskeletal: Strength & Muscle Tone: within normal limits Gait & Station: normal Patient leans: N/A  Psychiatric Specialty Exam: I reviewed physical exam performed in the ER and agree with the findings. Physical Exam  Nursing note and vitals reviewed. Psychiatric: Her mood appears anxious. Her affect is blunt. Her speech is delayed. She is slowed and withdrawn. Cognition and memory are impaired. She expresses impulsivity. She expresses suicidal ideation.    Review of Systems  Neurological: Positive for tremors.  Psychiatric/Behavioral: Positive for depression. The patient is nervous/anxious and has insomnia.   All other systems reviewed and are negative.   Blood pressure 101/71, pulse (!) 104, temperature 98 F (36.7 C), temperature source Oral, resp. rate 16, height 5\' 7"   (1.702 m), weight 64 kg, last menstrual period 02/10/2018, SpO2 98 %.Body mass index is 22.08 kg/m.  See SRA                                                  Sleep:  Number of Hours: 5.5    Treatment Plan Summary: Daily contact with patient  to assess and evaluate symptoms and progress in treatment and Medication management   Ms. Geise is an 17 year old female with a history of depression and incapacitating anxiety admitted for suicidal and homicidal ideation.  #Suicidal ideation -patient is able to contract for safety  #Mood -increase Luvox to 100 mg nightly -Trazodone 100 mg nightly  #Anxiety, PTSD and OCD type -Minipress 2 mg once to test tolerability  #Labs -lipid panel, TSH, A1C -EKG -pregnancy test  #Cannabis abuse -patient minimizes problems and declines treatment  #Disposition -discharge to home with family -follow up with [facility]  Observation Level/Precautions:  15 minute checks  Laboratory:  CBC Chemistry Profile HCG UDS UA  Psychotherapy:    Medications:    Consultations:    Discharge Concerns:    Estimated LOS:  Other:     Physician Treatment Plan for Primary Diagnosis: Severe recurrent major depression without psychotic features (HCC) Long Term Goal(s): Improvement in symptoms so as ready for discharge  Short Term Goals: Ability to identify changes in lifestyle to reduce recurrence of condition will improve, Ability to verbalize feelings will improve, Ability to disclose and discuss suicidal ideas, Ability to demonstrate self-control will improve, Ability to identify and develop effective coping behaviors will improve, Ability to maintain clinical measurements within normal limits will improve, Compliance with prescribed medications will improve and Ability to identify triggers associated with substance abuse/mental health issues will improve  Physician Treatment Plan for Secondary Diagnosis: Principal Problem:   Severe recurrent  major depression without psychotic features (HCC) Active Problems:   Post traumatic stress disorder (PTSD)   Cannabis use disorder, moderate, dependence (HCC)  Long Term Goal(s): Improvement in symptoms so as ready for discharge  Short Term Goals: Ability to identify changes in lifestyle to reduce recurrence of condition will improve, Ability to demonstrate self-control will improve and Ability to identify triggers associated with substance abuse/mental health issues will improve  I certify that inpatient services furnished can reasonably be expected to improve the patient's condition.    Kristine Linea, MD 10/10/20192:49 PM

## 2018-03-04 NOTE — Progress Notes (Signed)
Recreation Therapy Notes  Date: 03/04/2018  Time: 9:30 am   Location: Craft room   Behavioral response: N/A   Intervention Topic: Communication  Discussion/Intervention: Patient did not attend group.   Clinical Observations/Feedback:  Patient did not attend group.        Capricia Serda 03/04/2018 10:40 AM 

## 2018-03-04 NOTE — Progress Notes (Signed)
D- Patient alert and oriented. Patient presents in a pleasant mood on assessment stating that she slept ok last night and had some complaints of a headache, rating her pain a "7/10", and she did request medication from this Clinical research associate. Patient rates her depression a "7/10" and anxiety a "9/10" stating to this writer "my thoughts" and being "panicky" is what's making her feel this way. Patient denies SI, HI, AVH, at this time. Patient states that her goal for today is that "I wanna try to calm down and not over think", in which she will "talk to people more".  A- Scheduled medications administered to patient, per MD orders. Support and encouragement provided.  Routine safety checks conducted every 15 minutes.  Patient informed to notify staff with problems or concerns.  R- No adverse drug reactions noted. Patient contracts for safety at this time. Patient compliant with medications and treatment plan. Patient receptive, calm, and cooperative. Patient interacts well with others on the unit.  Patient remains safe at this time.

## 2018-03-04 NOTE — Progress Notes (Signed)
This Clinical research associate was notified by staff (MHTs and security on unit) that patient was lying on the floor outside of the community room. Patient stated to staff that she held onto the wall and assisted herself to the floor. Patient stated to this writer that she felt like "I couldn't stand up anymore". This Clinical research associate asked patient what could we do to make her more comfortable and keep her safe and patient stated "I don't know". Patient's mother, Carollee Herter, was called and notified about incident and all questions/concerns were addressed and answered. This Clinical research associate re-educated patient about patient safety and patient was provided with the yellow socks and armband and educated about why she was provided these items. Patient's vitals were taken and CBG obtained.

## 2018-03-04 NOTE — BHH Suicide Risk Assessment (Signed)
[facility] Admission Suicide Risk Assessment   Nursing information obtained from:  Patient Demographic factors:  Unemployed Current Mental Status:  Suicidal ideation indicated by patient Loss Factors:  Financial problems / change in socioeconomic status Historical Factors:  Impulsivity Risk Reduction Factors:  Positive therapeutic relationship  Total Time spent with patient: 1 hour Principal Problem: Severe recurrent major depression without psychotic features (HCC) Diagnosis:   Patient Active Problem List   Diagnosis Date Noted  . Severe recurrent major depression without psychotic features (HCC) [F33.2] 03/03/2018    Priority: High  . Cannabis use disorder, moderate, dependence (HCC) [F12.20] 03/04/2018  . Social anxiety disorder [F40.10] 05/01/2014  . Post traumatic stress disorder (PTSD) [F43.10] 05/01/2014  . Severe major depression, single episode, with psychotic features (HCC) [F32.3] 04/28/2014  . 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 SORE THROAT [J02.9] 01/04/2010  . CELLULITIS AND ABSCESS OF BUTTOCK [L02.31, L03.317] 12/06/2009   Subjective Data: suicidal ideation  Continued Clinical Symptoms:  Alcohol Use Disorder Identification Test Final Score (AUDIT): 3 The "Alcohol Use Disorders Identification Test", Guidelines for Use in Primary Care, Second Edition.  World Science writer [facility]). Score between 0-7:  no or low risk or alcohol related problems. Score between 8-15:  moderate risk of alcohol related problems. Score between 16-19:  high risk of alcohol related problems. Score 20 or above:  warrants further diagnostic evaluation for alcohol dependence and treatment.   CLINICAL FACTORS:   Severe Anxiety and/or Agitation Depression:   Impulsivity Insomnia   Musculoskeletal: Strength & Muscle Tone: within normal limits Gait & Station: normal Patient leans: N/A  Psychiatric Specialty Exam: Physical Exam  Nursing note and vitals reviewed. Psychiatric: Her mood  appears anxious. Her affect is blunt. Her speech is delayed. She is slowed and withdrawn. Cognition and memory are impaired. She expresses impulsivity. She expresses suicidal ideation.    Review of Systems  Neurological: Positive for tremors.  Psychiatric/Behavioral: Positive for depression and suicidal ideas. The patient is nervous/anxious and has insomnia.   All other systems reviewed and are negative.   Blood pressure 101/71, pulse (!) 104, temperature 98 F (36.7 C), temperature source Oral, resp. rate 16, height 5\' 7"  (1.702 m), weight 64 kg, last menstrual period 02/10/2018, SpO2 98 %.Body mass index is 22.08 kg/m.  General Appearance: Casual  Eye Contact:  Fair  Speech:  Slow and Slurred  Volume:  Decreased  Mood:  Anxious and Depressed  Affect:  Flat  Thought Process:  Goal Directed and Descriptions of Associations: Intact  Orientation:  Full (Time, Place, and Person)  Thought Content:  WDL  Suicidal Thoughts:  Yes.  with intent/plan  Homicidal Thoughts:  Yes.  without intent/plan  Memory:  Immediate;   Fair Recent;   Fair Remote;   Fair  Judgement:  Fair  Insight:  Shallow  Psychomotor Activity:  Psychomotor Retardation and ticks  Concentration:  Concentration: Poor and Attention Span: Poor  Recall:  Poor  Fund of Knowledge:  Poor  Language:  Poor  Akathisia:  No  Handed:  Right  AIMS (if indicated):     Assets:  Communication Skills Desire for Improvement Financial Resources/Insurance Housing Physical Health Resilience Social Support  ADL's:  Intact  Cognition:  WNL  Sleep:  Number of Hours: 5.5      COGNITIVE FEATURES THAT CONTRIBUTE TO RISK:  None    SUICIDE RISK:   Severe:  Frequent, intense, and enduring suicidal ideation, specific plan, no subjective intent, but some objective markers of intent (i.e., choice of  lethal method), the method is accessible, some limited preparatory behavior, evidence of impaired self-control, severe  dysphoria/symptomatology, multiple risk factors present, and few if any protective factors, particularly a lack of social support.  PLAN OF CARE: hospital admission, medication management, substance abuse counseling, discharge planning.  Robin Hess is an 17 year old female with a history of depression and incapacitating anxiety admitted for suicidal and homicidal ideation.  #Suicidal ideation -patient is able to contract for safety  #Mood -increase Luvox to 100 mg nightly -Trazodone 100 mg nightly  #Anxiety, PTSD and OCD type -Minipress 2 mg once to test tolerability  #Labs -lipid panel, TSH, A1C -EKG -pregnancy test  #Cannabis abuse -patient minimizes problems and declines treatment  #Disposition -discharge to home with family -follow up with [facility]  I certify that inpatient services furnished can reasonably be expected to improve the patient's condition.   Kristine Linea, MD 03/04/2018, 2:43 PM
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
## 2018-03-05 MED ORDER — PRAZOSIN HCL 1 MG PO CAPS
1.0000 mg | ORAL_CAPSULE | Freq: Once | ORAL | Status: AC
  Administered 2018-03-05: 1 mg via ORAL
  Filled 2018-03-05: qty 1

## 2018-03-05 MED ORDER — LAMOTRIGINE 25 MG PO TABS
25.0000 mg | ORAL_TABLET | Freq: Every day | ORAL | Status: DC
  Administered 2018-03-05 – 2018-03-07 (×3): 25 mg via ORAL
  Filled 2018-03-05 (×3): qty 1

## 2018-03-05 MED ORDER — ENSURE ENLIVE PO LIQD
237.0000 mL | Freq: Three times a day (TID) | ORAL | Status: DC
  Administered 2018-03-05 – 2018-03-08 (×8): 237 mL via ORAL

## 2018-03-05 NOTE — Progress Notes (Signed)
Recreation Therapy Notes  INPATIENT RECREATION THERAPY ASSESSMENT  Patient Details Name: Robin Hess MRN: [id number] DOB: 03/28/2001 Today's Date: 03/05/2018       Information Obtained From: Patient  Able to Participate in Assessment/Interview: Yes  Patient Presentation: Responsive  Reason for Admission (Per Patient): Active Symptoms, Suicidal Ideation  Patient Stressors:    Coping Skills:   Isolation, Avoidance  Leisure Interests (2+):  (N/A)  Frequency of Recreation/Participation:    Awareness of Community Resources:     [employer]:     Current Use:    If no, Barriers?:    Expressed Interest in [employer] Information:    [place] of Residence:  [place]  Patient Main Form of Transportation:    Patient Strengths:  N/A  Patient Identified Areas of Improvement:  N/A  Patient Goal for Hospitalization:  N/A  Current SI (including self-harm):  No  Current HI:  No  Current AVH: No  Staff Intervention Plan: Group Attendance, Collaborate with Interdisciplinary Treatment Team  Consent to Intern Participation: N/A  Cherl Gorney 03/05/2018, 3:50 PM

## 2018-03-05 NOTE — [facility] Group Notes (Signed)
  [facility] LCSW Group Therapy Note  Date/Time: 03/05/18, 1300  Type of Therapy/Topic:  Group Therapy:  Emotion Regulation  Participation Level:  Minimal   Mood:pleasant  Description of Group:    The purpose of this group is to assist patients in learning to regulate negative emotions and experience positive emotions. Patients will be guided to discuss ways in which they have been vulnerable to their negative emotions. These vulnerabilities will be juxtaposed with experiences of positive emotions or situations, and patients challenged to use positive emotions to combat negative ones. Special emphasis will be placed on coping with negative emotions in conflict situations, and patients will process healthy conflict resolution skills.  Therapeutic Goals: 1. Patient will identify two positive emotions or experiences to reflect on in order to balance out negative emotions:  2. Patient will label two or more emotions that they find the most difficult to experience:  3. Patient will be able to demonstrate positive conflict resolution skills through discussion or role plays:   Summary of Patient Progress:Pt mostly quiet in group, shared that anxiety is difficult for her to experience.  Pt did not participate in group discussion but was attentive and did respond to CSW questions.       Therapeutic Modalities:   Cognitive Behavioral Therapy Feelings Identification Dialectical Behavioral Therapy  Daleen Squibb, LCSW

## 2018-03-05 NOTE — Progress Notes (Signed)
D: Pt denies SI/HI/AVH, verbally is able to contract for safety. Pt is pleasant and cooperative, but presents sad, anxious, and a bit guarded frequently. Answers during our conversations are slow and thought blocking. Pt. Complains of some abdominal cramping this evening, but denies the need for PRN medications for pain. Pt. Reports these pains are "menstraul camps". Pt. Reports anxiety "9/10", but denies when asked if she needs PRN medications for anxiety to comfort her. Pt. Reports depression, "6/10".  Patient Interaction minimal, but appropriate. Pt. Met with family today during visiting hours and was very anxious. Pt. Reported dizziness on day shift, so was given wheelchair for mobility around the unit for safety. Pt. Given high fall risk safety education. Pt. Verbalizes understanding of education provided. Pt. Monitored per post-fall protocol.   A: Q x 15 minute observation checks were completed for safety. Patient was provided with education.  Patient was given/offered medications per orders. Patient  was encourage to attend groups, participate in unit activities and continue with plan of care. Pt. Chart and plans of care reviewed. Pt. Given support and encouragement.   R: Patient is complaint with medication and unit procedures.             Precautionary checks every 15 minutes for safety maintained, room free of safety hazards, patient sustains no injury or falls during this shift. Will endorse care to next shift.

## 2018-03-05 NOTE — Plan of Care (Signed)
Pt. Denies SI/HI, verbally is able to contract for safety. Pt. Reports she can remain safe while on the unit.    Problem: Self-Concept: Goal: Ability to disclose and discuss suicidal ideas will improve Outcome: Progressing   Problem: Safety: Goal: Ability to remain free from injury will improve Outcome: Progressing   Problem: Safety: Goal: Ability to disclose and discuss suicidal ideas will improve Outcome: Progressing

## 2018-03-05 NOTE — Progress Notes (Signed)
Recreation Therapy Notes   Date: 03/05/2018  Time: 9:30 am  Location: Craft Room  Behavioral response: Appropriate   Intervention Topic: Happiness  Discussion/Intervention:  Group content today was focused on Happiness. The group defined happiness and stated reasons they are and are not happy at times. Participants identified reasons they are normally happy and why. Individuals expressed how not being happy affects themselves and others. Patients stated reasons why happiness is important to them. The group described how they feel when they are happy. Individuals participated in the intervention "What is happiness" where they defined what happiness means to them.  Clinical Observations/Feedback:  Patient came to group and was focused on what peers and staff had to say about happiness. Individual was social with  staff while participating in the intervention.  Tion Tse LRT/CTRS          Berma Harts 03/05/2018 2:44 PM

## 2018-03-05 NOTE — Tx Team (Addendum)
Interdisciplinary Treatment and Diagnostic Plan Update  03/05/2018 Time of Session: 10:30am Robin Hess MRN: [id number]  Principal Diagnosis: Severe recurrent major depression without psychotic features [facility])  Secondary Diagnoses: Principal Problem:   Severe recurrent major depression without psychotic features (HCC) Active Problems:   Post traumatic stress disorder (PTSD)   Cannabis use disorder, moderate, dependence (HCC)   OCD (obsessive compulsive disorder)   Current Medications:  Current Facility-Administered Medications  Medication Dose Route Frequency Provider Last Rate Last Dose  . acetaminophen (TYLENOL) tablet 650 mg  650 mg Oral Q6H PRN Clapacs, [name], MD   650 mg at 03/04/18 1706  . alum & mag hydroxide-simeth (MAALOX/MYLANTA) 200-200-20 MG/5ML suspension 30 mL  30 mL Oral Q4H PRN Clapacs, [name], MD      . fluvoxaMINE (LUVOX) tablet 100 mg  100 mg Oral QHS Pucilowska, Jolanta B, MD   100 mg at 03/04/18 2111  . hydrOXYzine (ATARAX/VISTARIL) tablet 50 mg  50 mg Oral TID PRN Clapacs, [name], MD   50 mg at 03/05/18 0853  . Influenza vac split quadrivalent PF (FLUARIX) injection 0.5 mL  0.5 mL Intramuscular Tomorrow-1000 Pucilowska, Jolanta B, MD      . magnesium hydroxide (MILK OF MAGNESIA) suspension 30 mL  30 mL Oral Daily PRN Clapacs, [name], MD      . traZODone (DESYREL) tablet 100 mg  100 mg Oral QHS PRN Clapacs, [name], MD       PTA Medications: No medications prior to admission.    Patient Stressors: Financial difficulties Health problems Occupational concerns Substance abuse  Patient Strengths: Capable of independent living Barrister's clerk for treatment/growth Supportive family/friends  Treatment Modalities: Medication Management, Group therapy, Case management,  1 to 1 session with clinician, Psychoeducation, Recreational therapy.   Physician Treatment Plan for Primary Diagnosis: Severe recurrent major depression without psychotic  features (HCC) Long Term Goal(s): Improvement in symptoms so as ready for discharge Improvement in symptoms so as ready for discharge   Short Term Goals: Ability to identify changes in lifestyle to reduce recurrence of condition will improve Ability to verbalize feelings will improve Ability to disclose and discuss suicidal ideas Ability to demonstrate self-control will improve Ability to identify and develop effective coping behaviors will improve Ability to maintain clinical measurements within normal limits will improve Compliance with prescribed medications will improve Ability to identify triggers associated with substance abuse/mental health issues will improve Ability to identify changes in lifestyle to reduce recurrence of condition will improve Ability to demonstrate self-control will improve Ability to identify triggers associated with substance abuse/mental health issues will improve  Medication Management: Evaluate patient's response, side effects, and tolerance of medication regimen.  Therapeutic Interventions: 1 to 1 sessions, Unit Group sessions and Medication administration.  Evaluation of Outcomes: Progressing  Physician Treatment Plan for Secondary Diagnosis: Principal Problem:   Severe recurrent major depression without psychotic features (HCC) Active Problems:   Post traumatic stress disorder (PTSD)   Cannabis use disorder, moderate, dependence (HCC)   OCD (obsessive compulsive disorder)  Long Term Goal(s): Improvement in symptoms so as ready for discharge Improvement in symptoms so as ready for discharge   Short Term Goals: Ability to identify changes in lifestyle to reduce recurrence of condition will improve Ability to verbalize feelings will improve Ability to disclose and discuss suicidal ideas Ability to demonstrate self-control will improve Ability to identify and develop effective coping behaviors will improve Ability to maintain clinical measurements  within normal limits will improve Compliance with prescribed  medications will improve Ability to identify triggers associated with substance abuse/mental health issues will improve Ability to identify changes in lifestyle to reduce recurrence of condition will improve Ability to demonstrate self-control will improve Ability to identify triggers associated with substance abuse/mental health issues will improve     Medication Management: Evaluate patient's response, side effects, and tolerance of medication regimen.  Therapeutic Interventions: 1 to 1 sessions, Unit Group sessions and Medication administration.  Evaluation of Outcomes: Progressing   RN Treatment Plan for Primary Diagnosis: Severe recurrent major depression without psychotic features (HCC) Long Term Goal(s): Knowledge of disease and therapeutic regimen to maintain health will improve  Short Term Goals: Ability to identify and develop effective coping behaviors will improve and Compliance with prescribed medications will improve  Medication Management: RN will administer medications as ordered by provider, will assess and evaluate patient's response and provide education to patient for prescribed medication. RN will report any adverse and/or side effects to prescribing provider.  Therapeutic Interventions: 1 on 1 counseling sessions, Psychoeducation, Medication administration, Evaluate responses to treatment, Monitor vital signs and CBGs as ordered, Perform/monitor CIWA, COWS, AIMS and Fall Risk screenings as ordered, Perform wound care treatments as ordered.  Evaluation of Outcomes: Progressing   LCSW Treatment Plan for Primary Diagnosis: Severe recurrent major depression without psychotic features (HCC) Long Term Goal(s): Safe transition to appropriate next level of care at discharge, Engage patient in therapeutic group addressing interpersonal concerns.  Short Term Goals: Engage patient in aftercare planning with referrals  and resources, Increase social support, Identify triggers associated with mental health/substance abuse issues and Increase skills for wellness and recovery  Therapeutic Interventions: Assess for all discharge needs, 1 to 1 time with Social worker, Explore available resources and support systems, Assess for adequacy in community support network, Educate family and significant other(s) on suicide prevention, Complete Psychosocial Assessment, Interpersonal group therapy.  Evaluation of Outcomes: Progressing   Progress in Treatment: Attending groups: Yes. Participating in groups: Yes. Taking medication as prescribed: Yes. Toleration medication: Yes. Family/Significant other contact made: Yes, individual(s) contacted:  Arbie Cookey ( mom) Patient understands diagnosis: Yes. Discussing patient identified problems/goals with staff: Yes. Medical problems stabilized or resolved: Yes. Denies suicidal/homicidal ideation: Yes. Issues/concerns per patient self-inventory: No. Other:   New problem(s) identified: No, Describe:  None  New Short Term/Long Term Goal(s): "To get a better sense of functioning."  Patient Goals:  "To get a better sense of functioning."  Discharge Plan or Barriers: To discharge home and follow up with outpatient treatment.  Reason for Continuation of Hospitalization: Medication stabilization  Estimated Length of Stay: 7 days  Recreational Therapy: Patient Stressors: N/A Patient Goal: Patient will identify 3 new relaxation techniques to better manage anxiety within 5 recreation therapy group sessions  Attendees: Patient: Robin Hess 03/05/2018 11:17 AM  Physician: Dr. Jennet Maduro, MD 03/05/2018 11:17 AM  Nursing: Milas Hock, RN 03/05/2018 11:17 AM  RN Care Manager: 03/05/2018 11:17 AM  Social Worker: Johny Shears, LCSWA 03/05/2018 11:17 AM  Recreational Therapist: Danella Deis. Dreama Saa, LRT 03/05/2018 11:17 AM  Other:  03/05/2018 11:17 AM  Other:   03/05/2018 11:17 AM  Other: 03/05/2018 11:17 AM    Scribe for Treatment Team: Johny Shears, LCSW 03/05/2018 11:17 AM

## 2018-03-05 NOTE — Plan of Care (Signed)
Patient looks anxious,states "I have social anxiety,I am scared if I will do something wrong."Patient stated that she had thoughts of hurting herself,denies now.Denies HI and AVH.Patient simply smiles and answer the questions.Patient shows readiness to attend groups.Encouraged fluids with Minipress and educated about the medications.Patient stated that her anxiety is better after the groups.Appetite poor.Ensure given.Support and encouragement given.

## 2018-03-06 MED ORDER — PRAZOSIN HCL 1 MG PO CAPS
1.0000 mg | ORAL_CAPSULE | Freq: Two times a day (BID) | ORAL | Status: DC
  Administered 2018-03-06 – 2018-03-08 (×4): 1 mg via ORAL
  Filled 2018-03-06 (×7): qty 1

## 2018-03-06 NOTE — Progress Notes (Signed)
[facility] MD Progress Note  03/06/2018 8:25 PM Robin Hess  MRN:  [id number]  Subjective:   Robin Hess feels much better. She is no longer suicidal or homicidal. She tolerates medications well. She is interested in day program following discharge as well.  Attempted to call the mother again.  Principal Problem: Severe recurrent major depression without psychotic features (HCC) Diagnosis:   Patient Active Problem List   Diagnosis Date Noted  . Severe recurrent major depression without psychotic features (HCC) [F33.2] 03/03/2018    Priority: High  . Cannabis use disorder, moderate, dependence (HCC) [F12.20] 03/04/2018  . OCD (obsessive compulsive disorder) [F42.9] 03/04/2018  . Social anxiety disorder [F40.10] 05/01/2014  . Post traumatic stress disorder (PTSD) [F43.10] 05/01/2014  . Severe major depression, single episode, with psychotic features (HCC) [F32.3] 04/28/2014  . 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 SORE THROAT [J02.9] 01/04/2010  . CELLULITIS AND ABSCESS OF BUTTOCK [L02.31, L03.317] 12/06/2009   Total Time spent with patient: 20 minutes  Past Psychiatric History: anxiety  Past Medical History:  Past Medical History:  Diagnosis Date  . Allergy   . Anxiety   . Depression    History reviewed. No pertinent surgical history. Family History: History reviewed. No pertinent family history. Family Psychiatric  History: depression, bipolar Social History:  Social History   Substance and Sexual Activity  Alcohol Use No     Social History   Substance and Sexual Activity  Drug Use No    Social History   Socioeconomic History  . Marital status: Single    Spouse name: Not on file  . Number of children: Not on file  . Years of education: Not on file  . Highest education level: Not on file  Occupational History  . Not on file  Social Needs  . Financial resource strain: Not on file  . Food insecurity:    Worry: Not on file    Inability: Not on file  .  Transportation needs:    Medical: Not on file    Non-medical: Not on file  Tobacco Use  . Smoking status: Never Smoker  . Smokeless tobacco: Never Used  Substance and Sexual Activity  . Alcohol use: No  . Drug use: No  . Sexual activity: Never    Birth control/protection: None  Lifestyle  . Physical activity:    Days per week: Not on file    Minutes per session: Not on file  . Stress: Not on file  Relationships  . Social connections:    Talks on phone: Not on file    Gets together: Not on file    Attends religious service: Not on file    Active member of club or organization: Not on file    Attends meetings of clubs or organizations: Not on file    Relationship status: Not on file  Other Topics Concern  . Not on file  Social History Narrative  . Not on file   Additional Social History:                         Sleep: Fair  Appetite:  Poor  Current Medications: Current Facility-Administered Medications  Medication Dose Route Frequency Provider Last Rate Last Dose  . acetaminophen (TYLENOL) tablet 650 mg  650 mg Oral Q6H PRN Clapacs, [name], MD   650 mg at 03/04/18 1706  . alum & mag hydroxide-simeth (MAALOX/MYLANTA) 200-200-20 MG/5ML suspension 30 mL  30 mL Oral Q4H PRN Clapacs, John  T, MD      . feeding supplement (ENSURE ENLIVE) (ENSURE ENLIVE) liquid 237 mL  237 mL Oral TID BM Sandro Burgo B, MD   237 mL at 03/06/18 1418  . fluvoxaMINE (LUVOX) tablet 100 mg  100 mg Oral QHS Shivonne Schwartzman B, MD   100 mg at 03/05/18 2227  . hydrOXYzine (ATARAX/VISTARIL) tablet 50 mg  50 mg Oral TID PRN Clapacs, [name], MD   50 mg at 03/05/18 2227  . lamoTRIgine (LAMICTAL) tablet 25 mg  25 mg Oral QHS Alda Gaultney B, MD   25 mg at 03/05/18 2227  . magnesium hydroxide (MILK OF MAGNESIA) suspension 30 mL  30 mL Oral Daily PRN Clapacs, [name], MD      . prazosin (MINIPRESS) capsule 1 mg  1 mg Oral BID Nasia Cannan B, MD   1 mg at 03/06/18 1416  .  traZODone (DESYREL) tablet 100 mg  100 mg Oral QHS PRN Clapacs, [name], MD   100 mg at 03/05/18 2227    Lab Results: No results found for this or any previous visit (from the past 48 hour(s)).  Blood Alcohol level:  Lab Results  Component Value Date   [facility] <10 03/03/2018   ETH <11 04/28/2014    Metabolic Disorder Labs: Lab Results  Component Value Date   HGBA1C 4.9 03/03/2018   MPG 93.93 03/03/2018   Lab Results  Component Value Date   PROLACTIN 17.5 05/02/2014   Lab Results  Component Value Date   CHOL 153 03/03/2018   TRIG 70 03/03/2018   HDL 49 03/03/2018   CHOLHDL 3.1 03/03/2018   VLDL 14 03/03/2018   LDLCALC 90 03/03/2018   LDLCALC 89 05/02/2014    Physical Findings: AIMS: Facial and Oral Movements Muscles of Facial Expression: None, normal Lips and Perioral Area: None, normal Jaw: None, normal Tongue: None, normal,Extremity Movements Upper (arms, wrists, hands, fingers): None, normal Lower (legs, knees, ankles, toes): None, normal, Trunk Movements Neck, shoulders, hips: None, normal, Overall Severity Severity of abnormal movements (highest score from questions above): None, normal Incapacitation due to abnormal movements: None, normal Patient's awareness of abnormal movements (rate only patient's report): No Awareness, Dental Status Current problems with teeth and/or dentures?: No Does patient usually wear dentures?: No  CIWA:  CIWA-Ar Total: 0 COWS:  COWS Total Score: 2  Musculoskeletal: Strength & Muscle Tone: within normal limits Gait & Station: normal Patient leans: N/A  Psychiatric Specialty Exam: Physical Exam  Nursing note and vitals reviewed. Psychiatric: Her speech is normal and behavior is normal. Thought content normal. Her mood appears anxious. Cognition and memory are normal. She expresses impulsivity.    Review of Systems  Neurological: Negative.   Psychiatric/Behavioral: Negative.   All other systems reviewed and are negative.    Blood pressure 117/71, pulse (!) 101, temperature 97.8 F (36.6 C), temperature source Oral, resp. rate 18, height 5\' 7"  (1.702 m), weight 64 kg, last menstrual period 02/10/2018, SpO2 96 %.Body mass index is 22.08 kg/m.  General Appearance: Casual  Eye Contact:  Good  Speech:  Clear and Coherent  Volume:  Normal  Mood:  Anxious  Affect:  Appropriate  Thought Process:  Goal Directed and Descriptions of Associations: Intact  Orientation:  Full (Time, Place, and Person)  Thought Content:  WDL  Suicidal Thoughts:  No  Homicidal Thoughts:  No  Memory:  Immediate;   Fair Recent;   Fair Remote;   Fair  Judgement:  Fair  Insight:  Present  Psychomotor Activity:  Normal  Concentration:  Concentration: Fair and Attention Span: Fair  Recall:  [employer] of Knowledge:  Fair  Language:  Fair  Akathisia:  No  Handed:  Right  AIMS (if indicated):     Assets:  Communication Skills Desire for Improvement Financial Resources/Insurance Housing Physical Health Resilience Social Support  ADL's:  Intact  Cognition:  WNL  Sleep:  Number of Hours: 6.5     Treatment Plan Summary: Daily contact with patient to assess and evaluate symptoms and progress in treatment and Medication management   Ms. Quinnell is an 17 year old female with a history of depression and incapacitating anxiety admitted for suicidal and homicidal ideation.  #Suicidal ideation, resolved -patient is able to contract for safety  #Mood -continueLuvox to 100 mg nightly -Trazodone 100 mg nightly PRN -continue Lamictal 25 mg nightly for mood stabilization -Vistaril 50 mg TID PRN  #Anxiety, PTSD and OCD type -continue Minipress1mg  BID  #Poor intale -Ensure  #Labs -lipid panel, TSH, A1C are normal -EKG reviewed, NSR with QTc 445 -pregnancy test pending  #Cannabis abuse -patient minimizes problems and declines treatment  #Disposition -discharge to home with family -we recommend partial  hospitalization -follow up with [facility]  Kristine Linea, MD 03/06/2018, 8:25 PM

## 2018-03-06 NOTE — Plan of Care (Signed)
  Problem: Self-Concept: Goal: Ability to disclose and discuss suicidal ideas will improve Outcome: Progressing  Patient denies suicidal ideations.  

## 2018-03-06 NOTE — Progress Notes (Signed)
[facility] MD Progress Note  03/06/2018 12:09 AM Robin Hess  MRN:  [id number]  Subjective:    Robin Hess feels much better. Went to groups, participated, is in dayroom now. She looks relaxed, smiling, ready to have a conversation. She is not suicidal or homicidal. She slept well but felt a little drowsy this morning. She wants to try Minipress. She received 1 mg yesterday without problems. Start Minipress 1 mg BID.  Attempted to call the mother but no response.  Principal Problem: Severe recurrent major depression without psychotic features (HCC) Diagnosis:   Patient Active Problem List   Diagnosis Date Noted  . Severe recurrent major depression without psychotic features (HCC) [F33.2] 03/03/2018    Priority: High  . Cannabis use disorder, moderate, dependence (HCC) [F12.20] 03/04/2018  . OCD (obsessive compulsive disorder) [F42.9] 03/04/2018  . Social anxiety disorder [F40.10] 05/01/2014  . Post traumatic stress disorder (PTSD) [F43.10] 05/01/2014  . Severe major depression, single episode, with psychotic features (HCC) [F32.3] 04/28/2014  . 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 SORE THROAT [J02.9] 01/04/2010  . CELLULITIS AND ABSCESS OF BUTTOCK [L02.31, L03.317] 12/06/2009   Total Time spent with patient: 20 minutes  Past Psychiatric History: depression, anxiety  Past Medical History:  Past Medical History:  Diagnosis Date  . Allergy   . Anxiety   . Depression    History reviewed. No pertinent surgical history. Family History: History reviewed. No pertinent family history. Family Psychiatric  History: depression, bipolar Social History:  Social History   Substance and Sexual Activity  Alcohol Use No     Social History   Substance and Sexual Activity  Drug Use No    Social History   Socioeconomic History  . Marital status: Single    Spouse name: Not on file  . Number of children: Not on file  . Years of education: Not on file  . Highest education level: Not on  file  Occupational History  . Not on file  Social Needs  . Financial resource strain: Not on file  . Food insecurity:    Worry: Not on file    Inability: Not on file  . Transportation needs:    Medical: Not on file    Non-medical: Not on file  Tobacco Use  . Smoking status: Never Smoker  . Smokeless tobacco: Never Used  Substance and Sexual Activity  . Alcohol use: No  . Drug use: No  . Sexual activity: Never    Birth control/protection: None  Lifestyle  . Physical activity:    Days per week: Not on file    Minutes per session: Not on file  . Stress: Not on file  Relationships  . Social connections:    Talks on phone: Not on file    Gets together: Not on file    Attends religious service: Not on file    Active member of club or organization: Not on file    Attends meetings of clubs or organizations: Not on file    Relationship status: Not on file  Other Topics Concern  . Not on file  Social History Narrative  . Not on file   Additional Social History:                         Sleep: Fair  Appetite:  Fair  Current Medications: Current Facility-Administered Medications  Medication Dose Route Frequency Provider Last Rate Last Dose  . acetaminophen (TYLENOL) tablet 650 mg  650 mg Oral  Q6H PRN Clapacs, [name], MD   650 mg at 03/04/18 1706  . alum & mag hydroxide-simeth (MAALOX/MYLANTA) 200-200-20 MG/5ML suspension 30 mL  30 mL Oral Q4H PRN Clapacs, [name], MD      . feeding supplement (ENSURE ENLIVE) (ENSURE ENLIVE) liquid 237 mL  237 mL Oral TID BM Itzia Cunliffe B, MD   237 mL at 03/05/18 2100  . fluvoxaMINE (LUVOX) tablet 100 mg  100 mg Oral QHS Samuel Mcpeek B, MD   100 mg at 03/05/18 2227  . hydrOXYzine (ATARAX/VISTARIL) tablet 50 mg  50 mg Oral TID PRN Clapacs, [name], MD   50 mg at 03/05/18 2227  . lamoTRIgine (LAMICTAL) tablet 25 mg  25 mg Oral QHS Trung Wenzl B, MD   25 mg at 03/05/18 2227  . magnesium hydroxide (MILK OF MAGNESIA)  suspension 30 mL  30 mL Oral Daily PRN Clapacs, [name], MD      . traZODone (DESYREL) tablet 100 mg  100 mg Oral QHS PRN Clapacs, [name], MD   100 mg at 03/05/18 2227    Lab Results:  Results for orders placed or performed during the hospital encounter of 03/03/18 (from the past 48 hour(s))  Glucose, capillary     Status: Abnormal   Collection Time: 03/04/18  4:30 PM  Result Value Ref Range   Glucose-Capillary 113 (H) 70 - 99 mg/dL    Blood Alcohol level:  Lab Results  Component Value Date   ETH <10 03/03/2018   ETH <11 04/28/2014    Metabolic Disorder Labs: Lab Results  Component Value Date   HGBA1C 4.9 03/03/2018   MPG 93.93 03/03/2018   Lab Results  Component Value Date   PROLACTIN 17.5 05/02/2014   Lab Results  Component Value Date   CHOL 153 03/03/2018   TRIG 70 03/03/2018   HDL 49 03/03/2018   CHOLHDL 3.1 03/03/2018   VLDL 14 03/03/2018   LDLCALC 90 03/03/2018   LDLCALC 89 05/02/2014    Physical Findings: AIMS: Facial and Oral Movements Muscles of Facial Expression: None, normal Lips and Perioral Area: None, normal Jaw: None, normal Tongue: None, normal,Extremity Movements Upper (arms, wrists, hands, fingers): None, normal Lower (legs, knees, ankles, toes): None, normal, Trunk Movements Neck, shoulders, hips: None, normal, Overall Severity Severity of abnormal movements (highest score from questions above): None, normal Incapacitation due to abnormal movements: None, normal Patient's awareness of abnormal movements (rate only patient's report): No Awareness, Dental Status Current problems with teeth and/or dentures?: No Does patient usually wear dentures?: No  CIWA:  CIWA-Ar Total: 0 COWS:  COWS Total Score: 2  Musculoskeletal: Strength & Muscle Tone: within normal limits Gait & Station: normal Patient leans: N/A  Psychiatric Specialty Exam: Physical Exam  Nursing note and vitals reviewed. Psychiatric: Her speech is normal. Thought content  normal. Her mood appears anxious. She is withdrawn. Cognition and memory are normal. She expresses impulsivity. She exhibits a depressed mood.    Review of Systems  Neurological: Negative.   Psychiatric/Behavioral: Positive for depression. The patient is nervous/anxious.   All other systems reviewed and are negative.   Blood pressure 108/76, pulse 93, temperature 98.5 F (36.9 C), temperature source Oral, resp. rate 18, height 5\' 7"  (1.702 m), weight 64 kg, last menstrual period 02/10/2018, SpO2 99 %.Body mass index is 22.08 kg/m.  General Appearance: Casual  Eye Contact:  Good  Speech:  Clear and Coherent  Volume:  Decreased  Mood:  Anxious and Depressed  Affect:  Flat  Thought Process:  Goal Directed and Descriptions of Associations: Intact  Orientation:  Full (Time, Place, and Person)  Thought Content:  WDL  Suicidal Thoughts:  No  Homicidal Thoughts:  No  Memory:  Immediate;   Fair Recent;   Fair Remote;   Fair  Judgement:  Impaired  Insight:  Shallow  Psychomotor Activity:  Decreased  Concentration:  Concentration: Fair and Attention Span: Fair  Recall:  [employer] of Knowledge:  Fair  Language:  Fair  Akathisia:  No  Handed:  Right  AIMS (if indicated):     Assets:  Communication Skills Desire for Improvement Financial Resources/Insurance Housing Physical Health Resilience Social Support  ADL's:  Intact  Cognition:  WNL  Sleep:  Number of Hours: 7     Treatment Plan Summary: Daily contact with patient to assess and evaluate symptoms and progress in treatment and Medication management   Robin Hess is an 17 year old female with a history of depression and incapacitating anxiety admitted for suicidal and homicidal ideation.  #Suicidal ideation -patient is able to contract for safety  #Mood -continue Luvox to 100 mg nightly -Trazodone 100 mg nightly PRN -continue Lamictal 25 mg tonight for mood stabilization  #Anxiety, PTSD and OCD type -start  Minipress 1 mg BID  #Poor intale -Ensure  #Labs -lipid panel, TSH, A1C are normal -EKG reviewed, NSR with QTc 445 -pregnancy test pending  #Cannabis abuse -patient minimizes problems and declines treatment  #Disposition -discharge to home with family -we recommend partial hospitalization -follow up with [facility]  Kristine Linea, MD 03/06/2018, 12:09 AM

## 2018-03-06 NOTE — Progress Notes (Signed)
D: Patient stated slept good last night .Stated appetite  fair and energy level normal. Stated concentration is good . Stated on Depression scale 1 , hopeless 0 and anxiety 5 .( low 0-10 high) Denies suicidal  homicidal ideations  .  No auditory hallucinations  No pain concerns . Appropriate ADL'S. Interacting with peers and staff. Denies suicidal ideations  . Able to  verbalize feeling of self . No safety concerns  Verbalize  understanding of information received . Understanding of  medication  given , understanding of disease process. Patient goal for this shift  " Just not to isolate myself too much"  A: Encourage patient participation with unit programming . Instruction  Given on  Medication , verbalize understanding.  R: Voice no other concerns. Staff continue to monitor

## 2018-03-06 NOTE — Plan of Care (Signed)
Denies suicidal ideations  . Able to  verbalize feeling of self . No safety concerns  Verbalize  understanding of information received . Understanding of  medication  given , understanding of disease process.  Problem: Self-Concept: Goal: Ability to disclose and discuss suicidal ideas will improve Outcome: Progressing Goal: Will verbalize positive feelings about self Outcome: Progressing   Problem: Health Behavior/Discharge Planning: Goal: Ability to identify changes in lifestyle to reduce recurrence of condition will improve Outcome: Progressing Goal: Identification of resources available to assist in meeting health care needs will improve Outcome: Progressing   Problem: Physical Regulation: Goal: Complications related to the disease process, condition or treatment will be avoided or minimized Outcome: Progressing   Problem: Safety: Goal: Ability to remain free from injury will improve Outcome: Progressing   Problem: Education: Goal: Utilization of techniques to improve thought processes will improve Outcome: Progressing Goal: Knowledge of the prescribed therapeutic regimen will improve Outcome: Progressing   Problem: Safety: Goal: Ability to disclose and discuss suicidal ideas will improve Outcome: Progressing Goal: Ability to identify and utilize support systems that promote safety will improve Outcome: Progressing   Problem: Activity: Goal: Will identify at least one activity in which they can participate Outcome: Progressing   Problem: Self-Concept: Goal: Will verbalize positive feelings about self Outcome: Progressing

## 2018-03-06 NOTE — Plan of Care (Signed)
Patient denies  Suicidal ideations

## 2018-03-06 NOTE — [facility] Group Notes (Signed)
LCSW Group Therapy Note  03/06/2018 1:15pm  Type of Therapy and Topic:  Group Therapy:  Healthy Self Image and Positive Change  Participation Level:  Active   Description of Group:  In this group, patients will compare and contrast their current "I am...." statements to the visions they identify as desirable for their lives.  Patients discuss fears and how they can make positive changes in their cognitions that will positively impact their behaviors.  Facilitator played a motivational 3-minute speech and patients were left with the task of thinking about what "I am...." statements they can start using in their lives immediately.  Therapeutic Goals: 1. Patient will state their current self-perception as expressed in an "I Am" statement 2. Patient will contrast this with their desired vision for their live 3. Patient will identify 3 fears that negatively impact their behavior 4. Patient will discuss cognitive distortions that stem from their fears 5. Patient will verbalize statements that challenge their cognitive distortions  Summary of Patient Progress:  The patient reported that  she feels "alright." The patient stated, "I am not sure what I am" Patient discussed  her fears and how she can make positive changes in their cognitions that will positively impact  her behaviors. Patient was able to discuss and process cognitive distortions that stem from  her fears. Patient actively and appropriately engaged in the group. Patient was able to provide support and validation to other group members. Patient practiced active listening when interacting with the facilitator and other group members.    Therapeutic Modalities Cognitive Behavioral Therapy Motivational Interviewing  Jerrel Tiberio  CUEBAS-COLON, LCSW 03/06/2018 12:40 PM

## 2018-03-06 NOTE — Progress Notes (Signed)
Patient alert and oriented x 4, denies SI/HI/AVH, affect is flat but brightens upon approach, no distress noted, she appears less anxious and she is interacting appropriately with peers and staff. Patient eye contact is appropriate, rated depression a 4/10, receptive to treatment on unit, attended evening wrap up group wll continue to monitor closely.

## 2018-03-07 LAB — PREGNANCY, URINE: PREG TEST UR: NEGATIVE

## 2018-03-07 MED ORDER — FLUVOXAMINE MALEATE 100 MG PO TABS: 100.0000 mg | ORAL_TABLET | Freq: Every day | ORAL | 1 refills | Status: DC

## 2018-03-07 MED ORDER — PRAZOSIN HCL 1 MG PO CAPS: 1.0000 mg | ORAL_CAPSULE | Freq: Two times a day (BID) | ORAL | 1 refills | Status: DC

## 2018-03-07 MED ORDER — HYDROXYZINE HCL 50 MG PO TABS: 50.0000 mg | ORAL_TABLET | Freq: Three times a day (TID) | ORAL | 1 refills | Status: DC | PRN

## 2018-03-07 MED ORDER — LAMOTRIGINE 25 MG PO TABS: 25.0000 mg | ORAL_TABLET | Freq: Every day | ORAL | 1 refills | Status: DC

## 2018-03-07 MED ORDER — TRAZODONE HCL 100 MG PO TABS: 100.0000 mg | ORAL_TABLET | Freq: Every evening | ORAL | 1 refills | Status: DC | PRN

## 2018-03-07 NOTE — Discharge Summary (Signed)
Physician Discharge Summary Note  Patient:  Robin Hess is an 17 y.o., female MRN:  [id number] DOB:  09-01-2000 Patient phone:  [phone] (home)  Patient address:   [address] [place] [address],  Total Time spent with patient: 20 minutes plus 15 min on care coordination and documentation  Date of Admission:  03/03/2018 Date of Discharge: 03/08/2018  Reason for Admission:  Suicidal ideation.  History of Present Illness:   Identifying data. Robin Hess is an 17 year old female with a history of depression and anxiety.  Chief complaint. "I went to therapy."  History of present illness. Information was obtained from the patient and the chart. The patient came to the ER with her mother from her therapist office after she disclosed suicidal and homicidal thoughts. This was her first appointment with the therapist. Her depression has been getting worse over several months for no apparent reason. She has not been taking any medications lately. She reports poor sleep, depressed appetite, anhedonia, feeling of guilt hopelessness worthlessness, poor energy and concentration, social isolation, crying spells. She experiences frequent and intrusive suicidal thoughts. She denies hallucinations but frequently feels paranoid. She never leaves the house without a chaperone. She does not have driver's license. She has infrequent panic attacks lasting minutes, severe social anxiety, nightmares and flashbacks of PTSD. She does not share causes. She has obsessions and compulsions with checking behaviors. She complains of profound "minute to minute" mood swings and getting angry easily. She does not drink alcohol but uses cannabis.  Past psychiatric history. One prior hospitalization for depression and anxiety treated with Zoloft and Risperdal. History of cutting, last time several months ago in the leg. No suicide attempts.  Family psychiatric history. Mother with depression, father possibly  bipolar.  Social history. Dropped out of school in the tenth grade due to anxiety. Lives with her parents. Unemployed.  Principal Problem: Severe recurrent major depression without psychotic features [facility]) Discharge Diagnoses: Patient Active Problem List   Diagnosis Date Noted  . Severe recurrent major depression without psychotic features (HCC) [F33.2] 03/03/2018    Priority: High  . Cannabis use disorder, moderate, dependence (HCC) [F12.20] 03/04/2018  . OCD (obsessive compulsive disorder) [F42.9] 03/04/2018  . Social anxiety disorder [F40.10] 05/01/2014  . Post traumatic stress disorder (PTSD) [F43.10] 05/01/2014  . Severe major depression, single episode, with psychotic features (HCC) [F32.3] 04/28/2014  . 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 SORE THROAT [J02.9] 01/04/2010  . CELLULITIS AND ABSCESS OF BUTTOCK [L02.31, L03.317] 12/06/2009    Past Medical History:  Past Medical History:  Diagnosis Date  . Allergy   . Anxiety   . Depression    History reviewed. No pertinent surgical history. Family History: History reviewed. No pertinent family history.  Social History:  Social History   Substance and Sexual Activity  Alcohol Use No     Social History   Substance and Sexual Activity  Drug Use No    Social History   Socioeconomic History  . Marital status: Single    Spouse name: Not on file  . Number of children: Not on file  . Years of education: Not on file  . Highest education level: Not on file  Occupational History  . Not on file  Social Needs  . Financial resource strain: Not on file  . Food insecurity:    Worry: Not on file    Inability: Not on file  . Transportation needs:    Medical: Not on file    Non-medical: Not on  file  Tobacco Use  . Smoking status: Never Smoker  . Smokeless tobacco: Never Used  Substance and Sexual Activity  . Alcohol use: No  . Drug use: No  . Sexual activity: Never    Birth control/protection: None  Lifestyle  .  Physical activity:    Days per week: Not on file    Minutes per session: Not on file  . Stress: Not on file  Relationships  . Social connections:    Talks on phone: Not on file    Gets together: Not on file    Attends religious service: Not on file    Active member of club or organization: Not on file    Attends meetings of clubs or organizations: Not on file    Relationship status: Not on file  Other Topics Concern  . Not on file  Social History Narrative  . Not on file    Hospital Course:    Robin Hess is an 17 year old female with a history of depression and incapacitating anxiety admitted for suicidal and homicidal ideation. She accepted medications and tolerated them well. At the time of discharge, the patient is no longer suicidal or homicidal. She is able to contract for safety. He is forward thinking and optimistic about the future.   #Mood and anxiety -continueLuvox to 100 mg nightly for OCD -continue Minioress 1 mg BID for PTSD -Trazodone 100 mg nightlyPRN -continueLamictal titration for mood stabilization -Vistaril 50 mg TID PRN  #Labs -lipid panel, TSH, [facility] normal -EKGreviewed, NSR with QTc 445 -pregnancy testpending  #Cannabis abuse -patient minimizes problems and declines treatment  #Disposition -discharge to home with family -we recommend partial hospitalization -follow up with [facility]  Physical Findings: AIMS: Facial and Oral Movements Muscles of Facial Expression: None, normal Lips and Perioral Area: None, normal Jaw: None, normal Tongue: None, normal,Extremity Movements Upper (arms, wrists, hands, fingers): None, normal Lower (legs, knees, ankles, toes): None, normal, Trunk Movements Neck, shoulders, hips: None, normal, Overall Severity Severity of abnormal movements (highest score from questions above): None, normal Incapacitation due to abnormal movements: None, normal Patient's awareness of abnormal movements (rate only patient's  report): No Awareness, Dental Status Current problems with teeth and/or dentures?: No Does patient usually wear dentures?: No  CIWA:  CIWA-Ar Total: 0 COWS:  COWS Total Score: 2  Musculoskeletal: Strength & Muscle Tone: within normal limits Gait & Station: normal Patient leans: N/A  Psychiatric Specialty Exam: Physical Exam  Nursing note and vitals reviewed. Psychiatric: Her speech is normal and behavior is normal. Thought content normal. Her mood appears anxious. Cognition and memory are normal. She expresses impulsivity.    Review of Systems  Neurological: Negative.   Psychiatric/Behavioral: The patient is nervous/anxious.   All other systems reviewed and are negative.   Blood pressure 112/71, pulse (!) 101, temperature 97.8 F (36.6 C), temperature source Oral, resp. rate 18, height 5\' 7"  (1.702 m), weight 64 kg, last menstrual period 02/10/2018, SpO2 97 %.Body mass index is 22.08 kg/m.  General Appearance: Casual  Eye Contact:  Good  Speech:  Clear and Coherent  Volume:  Normal  Mood:  Anxious  Affect:  Appropriate  Thought Process:  Goal Directed and Descriptions of Associations: Intact  Orientation:  Full (Time, Place, and Person)  Thought Content:  WDL  Suicidal Thoughts:  No  Homicidal Thoughts:  No  Memory:  Immediate;   Fair Recent;   Fair Remote;   Fair  Judgement:  Impaired  Insight:  Shallow  Psychomotor Activity:  Normal  Concentration:  Concentration: Fair and Attention Span: Fair  Recall:  [employer] of Knowledge:  Fair  Language:  Fair  Akathisia:  No  Handed:  Right  AIMS (if indicated):     Assets:  Communication Skills Desire for Improvement Financial Resources/Insurance Housing Physical Health Resilience Social Support  ADL's:  Intact  Cognition:  WNL  Sleep:  Number of Hours: 6.45     Have you used any form of tobacco in the last 30 days? (Cigarettes, Smokeless Tobacco, Cigars, and/or Pipes): No  Has this patient used any form of  tobacco in the last 30 days? (Cigarettes, Smokeless Tobacco, Cigars, and/or Pipes) Yes, No  Blood Alcohol level:  Lab Results  Component Value Date   [facility] <10 03/03/2018   ETH <11 04/28/2014    Metabolic Disorder Labs:  Lab Results  Component Value Date   HGBA1C 4.9 03/03/2018   MPG 93.93 03/03/2018   Lab Results  Component Value Date   PROLACTIN 17.5 05/02/2014   Lab Results  Component Value Date   CHOL 153 03/03/2018   TRIG 70 03/03/2018   HDL 49 03/03/2018   CHOLHDL 3.1 03/03/2018   VLDL 14 03/03/2018   LDLCALC 90 03/03/2018   LDLCALC 89 05/02/2014    See Psychiatric Specialty Exam and Suicide Risk Assessment completed by Attending Physician prior to discharge.  Discharge destination:  Home  Is patient on multiple antipsychotic therapies at discharge:  No   Has Patient had three or more failed trials of antipsychotic monotherapy by history:  No  Recommended Plan for Multiple Antipsychotic Therapies: NA  Discharge Instructions    Diet - low sodium heart healthy   Complete by:  As directed    Increase activity slowly   Complete by:  As directed      Allergies as of 03/08/2018      Reactions   Amoxicillin    REACTION: Rash      Medication List    TAKE these medications     Indication  fluvoxaMINE 100 MG tablet Commonly known as:  LUVOX Take 1 tablet (100 mg total) by mouth at bedtime.  Indication:  Obsessive Compulsive Disorder, Posttraumatic Stress Disorder   hydrOXYzine 50 MG tablet Commonly known as:  ATARAX/VISTARIL Take 1 tablet (50 mg total) by mouth 3 (three) times daily as needed for anxiety.  Indication:  Feeling Anxious   lamoTRIgine 25 MG tablet Commonly known as:  LAMICTAL Take 1 tablet (25 mg total) by mouth at bedtime. Take 1 tab for two weeks, 2 tabs for 2 weeks, 4 tabs for 2 weeks, 8 tabs or 200 mg after  Indication:  Manic-Depression   prazosin 1 MG capsule Commonly known as:  MINIPRESS Take 1 capsule (1 mg total) by mouth 2  (two) times daily.  Indication:  PTSD   traZODone 100 MG tablet Commonly known as:  DESYREL Take 1 tablet (100 mg total) by mouth at bedtime as needed for sleep.  Indication:  Trouble Sleeping        Follow-up recommendations:  Activity:  as tolerated Diet:  regular Other:  keep follow up appointments  Comments:     Signed: Kristine Linea, MD 03/08/2018, 8:18 AM

## 2018-03-07 NOTE — BHH Group Notes (Signed)
LCSW Group Therapy Note 03/07/2018 1:15pm  Type of Therapy and Topic: Group Therapy: Feelings Around Returning Home & Establishing a Supportive Framework and Supporting Oneself When Supports Not Available  Participation Level: Active  Description of Group:  Patients first processed thoughts and feelings about upcoming discharge. These included fears of upcoming changes, lack of change, new living environments, judgements and expectations from others and overall stigma of mental health issues. The group then discussed the definition of a supportive framework, what that looks and feels like, and how do to discern it from an unhealthy non-supportive network. The group identified different types of supports as well as what to do when your family/friends are less than helpful or unavailable  Therapeutic Goals  1. Patient will identify one healthy supportive network that they can use at discharge. 2. Patient will identify one factor of a supportive framework and how to tell it from an unhealthy network. 3. Patient able to identify one coping skill to use when they do not have positive supports from others. 4. Patient will demonstrate ability to communicate their needs through discussion and/or role plays.  Summary of Patient Progress:  The patient reported she feels better than she was days ago. Pt engaged during group session. As patients processed their anxiety about discharge and described healthy supports patient shared she is ready to be discharge. She stated "meds are helping me a lot and I feel a lore confident." Patient listed her family as her main support system.  Patients identified at least one self-care tool they were willing to use after discharge; drawing and listening to music.   Therapeutic Modalities Cognitive Behavioral Therapy Motivational Interviewing   Robin Hess  CUEBAS-COLON, LCSW 03/07/2018 12:06 PM

## 2018-03-07 NOTE — Plan of Care (Signed)
Able to  verbalize feeling of self . No safety concerns  Verbalize  understanding of information received . Understanding of  medication  given , understanding of disease process.  Denies suicidal ideations   Problem: Self-Concept: Goal: Ability to disclose and discuss suicidal ideas will improve Outcome: Progressing Goal: Will verbalize positive feelings about self Outcome: Progressing   Problem: Health Behavior/Discharge Planning: Goal: Ability to identify changes in lifestyle to reduce recurrence of condition will improve Outcome: Progressing Goal: Identification of resources available to assist in meeting health care needs will improve Outcome: Progressing   Problem: Physical Regulation: Goal: Complications related to the disease process, condition or treatment will be avoided or minimized Outcome: Progressing   Problem: Safety: Goal: Ability to remain free from injury will improve Outcome: Progressing   Problem: Education: Goal: Utilization of techniques to improve thought processes will improve Outcome: Progressing Goal: Knowledge of the prescribed therapeutic regimen will improve Outcome: Progressing   Problem: Safety: Goal: Ability to disclose and discuss suicidal ideas will improve Outcome: Progressing Goal: Ability to identify and utilize support systems that promote safety will improve Outcome: Progressing   Problem: Activity: Goal: Will identify at least one activity in which they can participate Outcome: Progressing   Problem: Self-Concept: Goal: Will verbalize positive feelings about self Outcome: Progressing

## 2018-03-07 NOTE — BHH Suicide Risk Assessment (Signed)
[facility] Discharge Suicide Risk Assessment   Principal Problem: Severe recurrent major depression without psychotic features [facility]) Discharge Diagnoses:  Patient Active Problem List   Diagnosis Date Noted  . Severe recurrent major depression without psychotic features (HCC) [F33.2] 03/03/2018    Priority: High  . Cannabis use disorder, moderate, dependence (HCC) [F12.20] 03/04/2018  . OCD (obsessive compulsive disorder) [F42.9] 03/04/2018  . Social anxiety disorder [F40.10] 05/01/2014  . Post traumatic stress disorder (PTSD) [F43.10] 05/01/2014  . Severe major depression, single episode, with psychotic features (HCC) [F32.3] 04/28/2014  . 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 SORE THROAT [J02.9] 01/04/2010  . CELLULITIS AND ABSCESS OF BUTTOCK [L02.31, L03.317] 12/06/2009    Total Time spent with patient: 20 minutes  Musculoskeletal: Strength & Muscle Tone: within normal limits Gait & Station: normal Patient leans: N/A  Psychiatric Specialty Exam: Review of Systems  Neurological: Negative.   Psychiatric/Behavioral: The patient is nervous/anxious.   All other systems reviewed and are negative.   Blood pressure 112/71, pulse (!) 101, temperature 97.8 F (36.6 C), temperature source Oral, resp. rate 18, height 5\' 7"  (1.702 m), weight 64 kg, last menstrual period 02/10/2018, SpO2 97 %.Body mass index is 22.08 kg/m.  General Appearance: Casual  Eye Contact::  Good  Speech:  Clear and Coherent409  Volume:  Normal  Mood:  Anxious  Affect:  Appropriate  Thought Process:  Goal Directed and Descriptions of Associations: Intact  Orientation:  Full (Time, Place, and Person)  Thought Content:  WDL  Suicidal Thoughts:  No  Homicidal Thoughts:  No  Memory:  Immediate;   Fair Recent;   Fair Remote;   Fair  Judgement:  Impaired  Insight:  Shallow  Psychomotor Activity:  Normal  Concentration:  Fair  Recall:  [employer] of Knowledge:Fair  Language: Fair  Akathisia:  No  Handed:  Right   AIMS (if indicated):     Assets:  Communication Skills Desire for Improvement Financial Resources/Insurance Housing Physical Health Resilience Social Support  Sleep:  Number of Hours: 6.45  Cognition: WNL  ADL's:  Intact   Mental Status Per Nursing Assessment::   On Admission:  Suicidal ideation indicated by patient  Demographic Factors:  Adolescent or young adult, Caucasian and Unemployed  Loss Factors: NA  Historical Factors: Family history of mental illness or substance abuse  Risk Reduction Factors:   Sense of responsibility to family, Living with another person, especially a relative and Positive social support  Continued Clinical Symptoms:  Severe Anxiety and/or Agitation  Cognitive Features That Contribute To Risk:  None    Suicide Risk:  Minimal: No identifiable suicidal ideation.  Patients presenting with no risk factors but with morbid ruminations; may be classified as minimal risk based on the severity of the depressive symptoms    Plan Of Care/Follow-up recommendations:  Activity:  as tolerated Diet:  regular Other:  keep folow up appointment  Kristine Linea, MD 03/08/2018, 8:18 AM

## 2018-03-07 NOTE — Progress Notes (Signed)
Patient alert and oriented x 4, denies SI/HI/AVH, affect is bright upon approach, no distress noted, she appears less anxious and she is interacting appropriately with peers and staff, her thoughts are organized and coherent, eye contact is appropriate, rated depression a 4/10,  ( low 0-10 high) receptive to treatment on unit, attended evening wrap up group wll continue to monitor closely

## 2018-03-07 NOTE — Progress Notes (Signed)
Robin Hess is visible in the milieu. Currently sitting in the dayroom with staff and peers. Talking with one particular peer. Appears to be calm and pleasant. Denying thoughts of self harm. Denying hallucinations. Thought process organized. Received nutrition supplement as scheduled. Has no concerns so far. Was encouraged to talk to staff as needed. Safety precautions maintained on the unit.

## 2018-03-07 NOTE — Plan of Care (Signed)
  Problem: Self-Concept: Goal: Ability to disclose and discuss suicidal ideas will improve Outcome: Progressing  Patient denies SI/HI/AVH  

## 2018-03-07 NOTE — Progress Notes (Signed)
D: Patient stated slept good last night .Stated appetite is good and energy level normal. Stated concentration is good . Stated on Depression scale  1, hopeless 0 and anxiety 4 .( low 0-10 high) Denies suicidal  homicidal ideations  .  No auditory hallucinations  No pain concerns . Appropriate ADL'S. Interacting with peers and staff. Able to  verbalize feeling of self . No safety concerns  Verbalize  understanding of information received . Understanding of  medication  given , understanding of disease process.  Denies suicidal ideations  Patient noted very nervous  Stated she was going to work on her anxiety Education on breathing   A: Encourage patient participation with unit programming . Instruction  Given on  Medication , verbalize understanding.  R: Voice no other concerns. Staff continue to monitor

## 2018-03-07 NOTE — Plan of Care (Signed)
Visible in the milieu, interacting with staff and peers appropriately. Compliant with treatment

## 2018-03-08 NOTE — Progress Notes (Signed)
Recreation Therapy Notes  INPATIENT RECREATION TR PLAN  Patient Details Name: Robin Hess MRN: [id number] DOB: 2000/06/23 Today's Date: 03/08/2018  Rec Therapy Plan Is patient appropriate for Therapeutic Recreation?: Yes Treatment times per week: at least 3 Estimated Length of Stay: 5-7 days TR Treatment/Interventions: Group participation (Comment)  Discharge Criteria Pt will be discharged from therapy if:: Discharged Treatment plan/goals/alternatives discussed and agreed upon by:: Patient/family  Discharge Summary Short term goals set: Patient will identify 3 new relaxation techniques to better manage anxiety within 5 recreation therapy group sessions Short term goals met: Adequate for discharge Progress toward goals comments: Groups attended Which groups?: Coping skills, Other (Comment)(Happiness) Reason goals not met: N/A Therapeutic equipment acquired: N/A Reason patient discharged from therapy: Discharge from hospital Pt/family agrees with progress & goals achieved: Yes Date patient discharged from therapy: 03/08/18   Katya Rolston 03/08/2018, 2:33 PM

## 2018-03-08 NOTE — Progress Notes (Signed)
D: Patient is aware of  Discharge this shift .Patient denies suicidal /homicidal ideations. Patient received all belongings brought in   A: No Storage medications. Writer reviewed Discharge Summary, Suicide Risk Assessment, and Transitional Record. Patient also received Prescriptions   from  MD. Aware  Of follow up appointment .  R: Patient left unit with no questions  Or concerns  With mother . 

## 2018-03-08 NOTE — Progress Notes (Signed)
  [facility] Adult Case Management Discharge Plan :  Will you be returning to the same living situation after discharge:  Yes,  Home with family At discharge, do you have transportation home?: Yes,  Family coming at discharge Do you have the ability to pay for your medications: Yes,  Insurance  Release of information consent forms completed and in the chart;  Patient's signature needed at discharge.  Patient to Follow up at: Follow-up Information    [facility] [employer]. Go on 03/12/2018.   Why:  Please follow up with [facility] on Friday March 12, 2018 at 8:30am. Please bring your ID, Insurance information, Discharge paperwork and all medications that you are taking. Thank you. Contact information: [address] [place] [address] [phone]           Next level of care provider has access to [facility] Link:no  Safety Planning and Suicide Prevention discussed: Yes,  Completed with patient and mother.  Have you used any form of tobacco in the last 30 days? (Cigarettes, Smokeless Tobacco, Cigars, and/or Pipes): No  Has patient been referred to the Quitline?: N/A patient is not a smoker  Patient has been referred for addiction treatment: Yes  Johny Shears, LCSW 03/08/2018, 10:02 AM

## 2018-03-08 NOTE — Progress Notes (Signed)
Recreation Therapy Notes   Date: 03/08/2018  Time: 9:30 am  Location: Craft Room  Behavioral response: Appropriate   Intervention Topic: Coping Skills  Discussion/Intervention:  Group content on today was focused on coping skills. The group defined what coping skills are and when they can be used. Individuals described how they normally cope with thing and the coping skills they normally use. Patients expressed why it is important to cope with things and how not coping with things can affect you. The group participated in the intervention "Exploring coping skills" where they had a chance to test new coping skills they could use in the future.  Clinical Observations/Feedback:  Patient came to group and identified walking/pacing as a coping skills she uses. Individual participated in the intervention during group. [name] LRT/CTRS         [name] 03/08/2018 2:31 PM

## 2018-03-08 NOTE — Progress Notes (Signed)
Patient slept throughout the night and appeared to be comfortable in bed. Had no major concern throughout the shift. Staff continue to provide safety, support and encouragements.

## 2020-01-11 ENCOUNTER — Ambulatory Visit: Payer: Self-pay

## 2020-04-12 ENCOUNTER — Other Ambulatory Visit: Payer: Self-pay

## 2020-04-12 ENCOUNTER — Encounter: Payer: Self-pay | Admitting: Emergency Medicine

## 2020-04-12 ENCOUNTER — Emergency Department (EMERGENCY_DEPARTMENT_HOSPITAL)
Admission: EM | Admit: 2020-04-12 | Discharge: 2020-04-12 | Disposition: A | Discharge status: Home / Self Care | Payer: 59 | Source: Home / Self Care | Attending: Emergency Medicine | Admitting: Emergency Medicine

## 2020-04-12 DIAGNOSIS — F29 Unspecified psychosis not due to a substance or known physiological condition: Secondary | ICD-10-CM

## 2020-04-12 DIAGNOSIS — Z79899 Other long term (current) drug therapy: Secondary | ICD-10-CM | POA: Insufficient documentation

## 2020-04-12 DIAGNOSIS — F23 Brief psychotic disorder: Secondary | ICD-10-CM

## 2020-04-12 DIAGNOSIS — F333 Major depressive disorder, recurrent, severe with psychotic symptoms: Secondary | ICD-10-CM | POA: Insufficient documentation

## 2020-04-12 DIAGNOSIS — Z20822 Contact with and (suspected) exposure to covid-19: Secondary | ICD-10-CM | POA: Insufficient documentation

## 2020-04-12 DIAGNOSIS — F4325 Adjustment disorder with mixed disturbance of emotions and conduct: Secondary | ICD-10-CM | POA: Diagnosis not present

## 2020-04-12 DIAGNOSIS — F322 Major depressive disorder, single episode, severe without psychotic features: Secondary | ICD-10-CM | POA: Diagnosis not present

## 2020-04-12 HISTORY — DX: Cannabis dependence, uncomplicated: F12.20

## 2020-04-12 HISTORY — DX: Social phobia, unspecified: F40.10

## 2020-04-12 HISTORY — DX: Major depressive disorder, single episode, severe with psychotic features: F32.3

## 2020-04-12 HISTORY — DX: Obsessive-compulsive disorder, unspecified: F42.9

## 2020-04-12 LAB — ACETAMINOPHEN LEVEL: Acetaminophen (Tylenol), Serum: <10 ug/mL — ABNORMAL LOW (ref 10–30)

## 2020-04-12 LAB — COMPREHENSIVE METABOLIC PANEL
ALT: 25 U/L (ref 0–44)
AST: 34 U/L (ref 15–41)
Albumin: 4.6 g/dL (ref 3.5–5.0)
Alkaline Phosphatase: 50 U/L (ref 38–126)
Anion gap: 12 (ref 5–15)
BUN: 7 mg/dL (ref 6–20)
CO2: 21 mmol/L — ABNORMAL LOW (ref 22–32)
Calcium: 9.5 mg/dL (ref 8.9–10.3)
Chloride: 105 mmol/L (ref 98–111)
Creatinine, Ser: 0.76 mg/dL (ref 0.44–1.00)
GFR, Estimated: >60 mL/min (ref >60)
Glucose, Bld: 98 mg/dL (ref 70–99)
Potassium: 3.2 mmol/L — ABNORMAL LOW (ref 3.5–5.1)
Sodium: 138 mmol/L (ref 135–145)
Total Bilirubin: 0.8 mg/dL (ref 0.3–1.2)
Total Protein: 7.8 g/dL (ref 6.5–8.1)

## 2020-04-12 LAB — PREGNANCY, URINE: Preg Test, Ur: NEGATIVE

## 2020-04-12 LAB — CBC
HCT: 42.6 % (ref 36.0–46.0)
Hemoglobin: 14.7 g/dL (ref 12.0–15.0)
MCH: 28.7 pg (ref 26.0–34.0)
MCHC: 34.5 g/dL (ref 30.0–36.0)
MCV: 83 fL (ref 80.0–100.0)
Platelets: 287 10*3/uL (ref 150–400)
RBC: 5.13 MIL/uL — ABNORMAL HIGH (ref 3.87–5.11)
RDW: 13.4 % (ref 11.5–15.5)
WBC: 15 10*3/uL — ABNORMAL HIGH (ref 4.0–10.5)
nRBC: 0 % (ref 0.0–0.2)

## 2020-04-12 LAB — URINE DRUG SCREEN, QUALITATIVE (ARMC ONLY)
Amphetamines, Ur Screen: NOT DETECTED
Barbiturates, Ur Screen: NOT DETECTED
Benzodiazepine, Ur Scrn: NOT DETECTED
Cannabinoid 50 Ng, Ur ~~LOC~~: NOT DETECTED
Cocaine Metabolite,Ur ~~LOC~~: NOT DETECTED
MDMA (Ecstasy)Ur Screen: NOT DETECTED
Methadone Scn, Ur: NOT DETECTED
Opiate, Ur Screen: NOT DETECTED
Phencyclidine (PCP) Ur S: NOT DETECTED
Tricyclic, Ur Screen: NOT DETECTED

## 2020-04-12 LAB — RESP PANEL BY RT-PCR (FLU A&B, COVID) ARPGX2
Influenza A by PCR: NEGATIVE
Influenza B by PCR: NEGATIVE
SARS Coronavirus 2 by RT PCR: NEGATIVE

## 2020-04-12 LAB — ETHANOL: Alcohol, Ethyl (B): <10 mg/dL (ref <10)

## 2020-04-12 LAB — SALICYLATE LEVEL: Salicylate Lvl: <7 mg/dL — ABNORMAL LOW (ref 7.0–30.0)

## 2020-04-12 MED ORDER — DROPERIDOL 2.5 MG/ML IJ SOLN
5.0000 mg | Freq: Once | INTRAMUSCULAR | Status: AC
  Administered 2020-04-12: 5 mg via INTRAMUSCULAR

## 2020-04-12 MED ORDER — DROPERIDOL 2.5 MG/ML IJ SOLN: 7.5000 mg | Freq: Once | INTRAMUSCULAR | Status: DC

## 2020-04-12 MED ORDER — DROPERIDOL 2.5 MG/ML IJ SOLN
10.0000 mg | Freq: Once | INTRAMUSCULAR | Status: DC
  Filled 2020-04-12: qty 4

## 2020-04-12 NOTE — ED Notes (Signed)
Patient came out of room screaming she was the anti-christ, easily redirected to her room. Patient having word salads, jumping from one thought to another. Laughs one minute and cries the next. Awaiting bed or admission status.

## 2020-04-12 NOTE — ED Notes (Signed)
IVC, pend admit

## 2020-04-12 NOTE — ED Notes (Signed)
Report to include Situation, Background, Assessment, and Recommendations received from [facility]. Patient alert and oriented, warm and dry, in no acute distress. UTA SI, HI, AVH and pain. Patient made aware of Q15 minute rounds and Psychologist, counselling presence for their safety. Patient instructed to come to me with needs or concerns.

## 2020-04-12 NOTE — BH Assessment (Signed)
PATIENT BED AVAILABLE AFTER 10:30PM  Patient is to be admitted to [facility] BMU by Dr. Toni Amend.  Attending Physician will be Dr. Les Pou.   Patient has been assigned to room 304, by [facility] Charge Nurse [place].    ER staff is aware of the admission:  [facility] ER Secretary    Dr. Katrinka Blazing, ER MD   [facility] Patient's Nurse   Allie Patient Access.

## 2020-04-12 NOTE — ED Notes (Signed)
Father at bedside, patient behaving very bizarre as per parent states this is the worse he's ever seen her, reports she has not slept in days. Father kept reaping to his daughter that she needs to get some sleep, daughter keep reeating she was having illusions of end of world type of things,as she was describing her dreams to her father laughing inappropriately and then begins to cry.
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
## 2020-04-12 NOTE — ED Notes (Signed)
Patient is stable in NAD. She is calm and cooperative. She is transferred to [facility] via Environmental health practitioner. Patient belongings given to the floor. Report given to [facility]. No issues.
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
## 2020-04-12 NOTE — Consult Note (Signed)
  Brief consult: Full note to follow once more information is available.  Patient has been seen and chart has been reviewed.  19 year old woman brought in by EMS for what sounds like erratic behavior.  Very little detail.  Patient unable to provide much information due to current mental status.  What labs we have been evaluated.  Drug screen and other labs still pending.  At this point no indication for any medication orders yet.  I have called family to try to get collateral information.  Continue IVC.

## 2020-04-12 NOTE — ED Notes (Signed)
Patient is in the room resting at this time. Will continue to monitor.

## 2020-04-12 NOTE — ED Notes (Signed)
Hourly rounding reveals patient in room. No complaints, stable, in no acute distress. Q15 minute rounds and monitoring via Rover and Officer to continue.   

## 2020-04-12 NOTE — ED Notes (Signed)
Dr clapacs at bedside 

## 2020-04-12 NOTE — ED Notes (Signed)
Patient was stopped by the officer and staff when she attempted to leave the Quad. She appears to be paranoid and responding to external stimuli. She was redirecting back to her room but continues to leave. Officer and staff in the room with patient to calm her down. EDP notified.
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
## 2020-04-12 NOTE — Consult Note (Signed)
[facility]   Reason for Consult:   Patient is a consult for this 19 year old woman brought into the hospital under IVC after running away from home with bizarre behavior Referring Physician: Paduchowski Patient Identification: Robin Hess MRN:  [id number] Principal Diagnosis: Adjustment disorder with mixed disturbance of emotions and conduct Diagnosis:  Principal Problem:   Adjustment disorder with mixed disturbance of emotions and conduct Active Problems:   Psychosis (HCC)   Total Time spent with patient: 1 hour  Subjective:   Robin Hess is a 19 y.o. female patient admitted with "it is my fault".  HPI: Patient seen and chart reviewed.  Spoke with patient's mother and father as well.  Confirmed that best phone number for reaching them is [phone], which is different than what is listed in the chart.  Patient was brought in by EMS after running away from family running out through the woods with bizarre talking behavior.  Reported by EMS that the patient was muttering to herself about being guilty of something or of having killed someone.  On interview the patient was only partially cooperative very withdrawn.  Hands stayed over her face most of the time.  Patient said that she had run through the woods because she thought someone was trying to shoot her.  When asked who it was she said just it was a friend of hers.  Could not give any better explanation of what that was about.  Also said she felt like people were out to get her.  After this she pretty much clammed up.  Spoke with the mother who reports that the patient has been struggling with behavior and mental health problems for years but has appeared to be worse over the last few months.  More paranoid more withdrawn more strange.  Not sleeping at night.  Recently has become caught up in things on the Internet that she seems to be blowing out of proportion.  Patient is not currently receiving any mental health  treatment and not on any psychiatric medicine.  Patient denied any recent alcohol or drug abuse and her alcohol level and drug screen are negative.  Past Psychiatric History: Patient has had 2 prior hospitalizations in 2015 and 2019.  Last diagnosis was depression.  Did not follow-up with outpatient treatment.  Not currently getting any outpatient treatment.  Has made suicidal statements in the past.  Recently had not been trying to hurt her self or showing any violence to others.  Some history of cannabis use in the past but currently does not seem to be an active issue.  Risk to Self:   Risk to Others:   Prior Inpatient Therapy:   Prior Outpatient Therapy:    Past Medical History:  Past Medical History:  Diagnosis Date  . Allergy   . Anxiety   . Cannabis use disorder, moderate, dependence (HCC)   . Depression   . OCD (obsessive compulsive disorder)   . Severe major depression, single episode, with psychotic features (HCC)   . Social anxiety disorder    History reviewed. No pertinent surgical history. Family History: No family history on file. Family Psychiatric  History: None reported Social History:  Social History   Substance and Sexual Activity  Alcohol Use No     Social History   Substance and Sexual Activity  Drug Use Yes  . Types: Marijuana    Social History   Socioeconomic History  . Marital status: Single    Spouse name: Not on file  .  Number of children: Not on file  . Years of education: Not on file  . Highest education level: Not on file  Occupational History  . Not on file  Tobacco Use  . Smoking status: Never Smoker  . Smokeless tobacco: Never Used  Substance and Sexual Activity  . Alcohol use: No  . Drug use: Yes    Types: Marijuana  . Sexual activity: Never    Birth control/protection: None  Other Topics Concern  . Not on file  Social History Narrative  . Not on file   Social Determinants of Health   Financial Resource Strain:   .  Difficulty of Paying Living Expenses: Not on file  Food Insecurity:   . Worried About Programme researcher, broadcasting/film/video in the Last Year: Not on file  . Ran Out of Food in the Last Year: Not on file  Transportation Needs:   . Lack of Transportation (Medical): Not on file  . Lack of Transportation (Non-Medical): Not on file  Physical Activity:   . Days of Exercise per Week: Not on file  . Minutes of Exercise per Session: Not on file  Stress:   . Feeling of Stress : Not on file  Social Connections:   . Frequency of Communication with Friends and Family: Not on file  . Frequency of Social Gatherings with Friends and Family: Not on file  . Attends Religious Services: Not on file  . Active Member of Clubs or Organizations: Not on file  . Attends Banker Meetings: Not on file  . Marital Status: Not on file   Additional Social History:    Allergies:   Allergies  Allergen Reactions  . Amoxicillin Rash    REACTION: Rash REACTION: Rash    Labs:  Results for orders placed or performed during the hospital encounter of 04/12/20 (from the past 48 hour(s))  CBC     Status: Abnormal   Collection Time: 04/12/20 10:20 AM  Result Value Ref Range   WBC 15.0 (H) 4.0 - 10.5 K/uL   RBC 5.13 (H) 3.87 - 5.11 MIL/uL   Hemoglobin 14.7 12.0 - 15.0 g/dL   HCT 62.2 36 - 46 %   MCV 83.0 80.0 - 100.0 fL   MCH 28.7 26.0 - 34.0 pg   MCHC 34.5 30.0 - 36.0 g/dL   RDW 29.7 98.9 - 21.1 %   Platelets 287 150 - 400 K/uL   nRBC 0.0 0.0 - 0.2 %    Comment: Performed at [facility], [address] [place], [place] [address]  Comprehensive metabolic panel     Status: Abnormal   Collection Time: 04/12/20 10:20 AM  Result Value Ref Range   Sodium 138 135 - 145 mmol/L   Potassium 3.2 (L) 3.5 - 5.1 mmol/L   Chloride 105 98 - 111 mmol/L   CO2 21 (L) 22 - 32 mmol/L   Glucose, Bld 98 70 - 99 mg/dL    Comment: Glucose reference range applies only to samples taken after fasting for at least 8 hours.    BUN 7 6 - 20 mg/dL   Creatinine, Ser 0.81 0.44 - 1.00 mg/dL   Calcium 9.5 8.9 - 44.8 mg/dL   Total Protein 7.8 6.5 - 8.1 g/dL   Albumin 4.6 3.5 - 5.0 g/dL   AST 34 15 - 41 U/L   ALT 25 0 - 44 U/L   Alkaline Phosphatase 50 38 - 126 U/L   Total Bilirubin 0.8 0.3 - 1.2 mg/dL  GFR, Estimated >60 >60 mL/min    Comment: (NOTE) Calculated using the CKD-EPI Creatinine Equation (2021)    Anion gap 12 5 - 15    Comment: Performed at [facility], [address] [place], [place] [address]  Ethanol     Status: None   Collection Time: 04/12/20 10:20 AM  Result Value Ref Range   Alcohol, Ethyl (B) <10 <10 mg/dL    Comment: (NOTE) Lowest detectable limit for serum alcohol is 10 mg/dL.  For medical purposes only. Performed at [facility], [address] [place], [place] [address]   Salicylate level     Status: Abnormal   Collection Time: 04/12/20 10:20 AM  Result Value Ref Range   Salicylate Lvl <7.0 (L) 7.0 - 30.0 mg/dL    Comment: Performed at [facility], [address] [place], [place] [address]  Acetaminophen level     Status: Abnormal   Collection Time: 04/12/20 10:20 AM  Result Value Ref Range   Acetaminophen (Tylenol), Serum <10 (L) 10 - 30 ug/mL    Comment: (NOTE) Therapeutic concentrations vary significantly. A range of 10-30 ug/mL  may be an effective concentration for many patients. However, some  are best treated at concentrations outside of this range. Acetaminophen concentrations >150 ug/mL at 4 hours after ingestion  and >50 ug/mL at 12 hours after ingestion are often associated with  toxic reactions.  Performed at [facility], [address] [place], [place] [address]   Urine Drug Screen, Qualitative [facility] only)     Status: None   Collection Time: 04/12/20 10:57 AM  Result Value Ref Range   Tricyclic, Ur Screen NONE DETECTED NONE DETECTED   Amphetamines, Ur Screen NONE DETECTED NONE DETECTED   MDMA (Ecstasy)Ur Screen  NONE DETECTED NONE DETECTED   Cocaine Metabolite,Ur [place] NONE DETECTED NONE DETECTED   Opiate, Ur Screen NONE DETECTED NONE DETECTED   Phencyclidine (PCP) Ur S NONE DETECTED NONE DETECTED   Cannabinoid 50 Ng, Ur [place] NONE DETECTED NONE DETECTED   Barbiturates, Ur Screen NONE DETECTED NONE DETECTED   Benzodiazepine, Ur Scrn NONE DETECTED NONE DETECTED   Methadone Scn, Ur NONE DETECTED NONE DETECTED    Comment: (NOTE) Tricyclics + metabolites, urine    Cutoff 1000 ng/mL Amphetamines + metabolites, urine  Cutoff 1000 ng/mL MDMA (Ecstasy), urine              Cutoff 500 ng/mL Cocaine Metabolite, urine          Cutoff 300 ng/mL Opiate + metabolites, urine        Cutoff 300 ng/mL Phencyclidine (PCP), urine         Cutoff 25 ng/mL Cannabinoid, urine                 Cutoff 50 ng/mL Barbiturates + metabolites, urine  Cutoff 200 ng/mL Benzodiazepine, urine              Cutoff 200 ng/mL Methadone, urine                   Cutoff 300 ng/mL  The urine drug screen provides only a preliminary, unconfirmed analytical test result and should not be used for non-medical purposes. Clinical consideration and professional judgment should be applied to any positive drug screen result due to possible interfering substances. A more specific alternate chemical method must be used in order to obtain a confirmed analytical result. Gas chromatography / mass spectrometry (GC/MS) is the preferred confirm atory method. Performed at  [facility], [address] [place], [place] [address]     No current facility-administered medications for this encounter.   Current Outpatient Medications  Medication Sig Dispense Refill  . fluvoxaMINE (LUVOX) 100 MG tablet Take 1 tablet (100 mg total) by mouth at bedtime. 30 tablet 1  . hydrOXYzine (ATARAX/VISTARIL) 50 MG tablet Take 1 tablet (50 mg total) by mouth 3 (three) times daily as needed for anxiety. 90 tablet 1  . lamoTRIgine (LAMICTAL) 25 MG tablet Take 1 tablet  (25 mg total) by mouth at bedtime. Take 1 tab for two weeks, 2 tabs for 2 weeks, 4 tabs for 2 weeks, 8 tabs or 200 mg after 45 tablet 1  . prazosin (MINIPRESS) 1 MG capsule Take 1 capsule (1 mg total) by mouth 2 (two) times daily. 60 capsule 1  . traZODone (DESYREL) 100 MG tablet Take 1 tablet (100 mg total) by mouth at bedtime as needed for sleep. 30 tablet 1    Musculoskeletal: Strength & Muscle Tone: within normal limits Gait & Station: normal Patient leans: N/A  Psychiatric Specialty Exam: Physical Exam Vitals and nursing note reviewed.  Constitutional:      Appearance: She is well-developed.  HENT:     Head: Normocephalic and atraumatic.  Eyes:     Conjunctiva/sclera: Conjunctivae normal.     Pupils: Pupils are equal, round, and reactive to light.  Cardiovascular:     Heart sounds: Normal heart sounds.  Pulmonary:     Effort: Pulmonary effort is normal.  Abdominal:     Palpations: Abdomen is soft.  Musculoskeletal:        General: Normal range of motion.     Cervical back: Normal range of motion.  Skin:    General: Skin is warm and dry.  Neurological:     General: No focal deficit present.     Mental Status: She is alert.  Psychiatric:        Attention and Perception: She is inattentive.        Mood and Affect: Mood is anxious. Affect is inappropriate.        Speech: She is noncommunicative.        Behavior: Behavior is withdrawn.        Thought Content: Thought content is paranoid.        Cognition and Memory: Cognition is impaired.        Judgment: Judgment is impulsive.     Review of Systems  Constitutional: Negative.   HENT: Negative.   Eyes: Negative.   Respiratory: Negative.   Cardiovascular: Negative.   Gastrointestinal: Negative.   Musculoskeletal: Negative.   Skin: Negative.   Neurological: Negative.   Psychiatric/Behavioral: Positive for dysphoric mood.    Blood pressure (!) 135/92, pulse (!) 112, temperature 97.8 F (36.6 C), temperature source  Oral, resp. rate 20, height  (1.803 m), weight 63.5 kg, SpO2 99 %.Body mass index is 19.53 kg/m.  General Appearance: Disheveled  Eye Contact:  Minimal  Speech:  Garbled  Volume:  Decreased  Mood:  Dysphoric  Affect:  Constricted  Thought Process:  Disorganized  Orientation:  Negative  Thought Content:  Illogical, Paranoid Ideation and Rumination  Suicidal Thoughts:  Yes.  without intent/plan  Homicidal Thoughts:  No  Memory:  Immediate;   Fair Recent;   Poor Remote;   Poor  Judgement:  Impaired  Insight:  Shallow  Psychomotor Activity:  Decreased  Concentration:  Concentration: Poor  Recall:  Poor  Fund of Knowledge:  Fair  Language:  Fair  Akathisia:  No  Handed:  Right  AIMS (if indicated):     Assets:  Housing Physical Health Social Support  ADL's:  Impaired  Cognition:  Impaired,  Mild  Sleep:        Treatment Plan Summary: Daily contact with patient to assess and evaluate symptoms and progress in treatment, Medication management and Plan 19 year old woman with a history of mental health problems not currently in any treatment.  Immediate behavior that would seem consistent with acute psychosis.  Limited information available right now.  Family's description would be consistent with worsening psychosis but she is not currently getting any professional treatment.  Patient able to provide only minimal information.  Patient is living with her family not working not in school low functioning.  Differential diagnosis at this point would include new onset schizophrenia, psychotic depression, bipolar disorder, possibly PTSD although there is no indication of any specific past or recent trauma to account for this.  Possible substance although that looks unlikely given labs so far.  Patient otherwise appears to be in adequate physical health.  Plan will be to continue IVC and recommend inpatient admission for further evaluation and treatment.  Case reviewed with emergency room  physician and TTS  Disposition: Recommend psychiatric Inpatient admission when medically cleared.  Mordecai Rasmussen, MD 04/12/2020 12:38 PM

## 2020-04-12 NOTE — ED Notes (Signed)
Patient is in the room and resting. Will continue to monitor.

## 2020-04-12 NOTE — ED Notes (Signed)
Urine already sent, lab called to run a urine preg test on patient.

## 2020-04-13 ENCOUNTER — Other Ambulatory Visit: Payer: Self-pay

## 2020-04-13 ENCOUNTER — Inpatient Hospital Stay
Admission: RE | Admit: 2020-04-13 | Discharge: 2020-05-16 | DRG: 885 | Disposition: A | Discharge status: Home / Self Care | Payer: 59 | Source: Intra-hospital | Attending: Behavioral Health | Admitting: Behavioral Health

## 2020-04-13 ENCOUNTER — Encounter: Payer: Self-pay | Admitting: Psychiatry

## 2020-04-13 DIAGNOSIS — R45851 Suicidal ideations: Secondary | ICD-10-CM | POA: Diagnosis present

## 2020-04-13 DIAGNOSIS — F23 Brief psychotic disorder: Secondary | ICD-10-CM | POA: Diagnosis present

## 2020-04-13 DIAGNOSIS — Z79899 Other long term (current) drug therapy: Secondary | ICD-10-CM

## 2020-04-13 DIAGNOSIS — Z880 Allergy status to penicillin: Secondary | ICD-10-CM | POA: Diagnosis not present

## 2020-04-13 DIAGNOSIS — F4325 Adjustment disorder with mixed disturbance of emotions and conduct: Secondary | ICD-10-CM | POA: Diagnosis present

## 2020-04-13 DIAGNOSIS — G249 Dystonia, unspecified: Secondary | ICD-10-CM | POA: Diagnosis present

## 2020-04-13 DIAGNOSIS — G4700 Insomnia, unspecified: Secondary | ICD-10-CM | POA: Diagnosis present

## 2020-04-13 DIAGNOSIS — Z20822 Contact with and (suspected) exposure to covid-19: Secondary | ICD-10-CM | POA: Diagnosis present

## 2020-04-13 DIAGNOSIS — F4010 Social phobia, unspecified: Secondary | ICD-10-CM | POA: Diagnosis present

## 2020-04-13 DIAGNOSIS — F323 Major depressive disorder, single episode, severe with psychotic features: Secondary | ICD-10-CM | POA: Diagnosis present

## 2020-04-13 DIAGNOSIS — F429 Obsessive-compulsive disorder, unspecified: Secondary | ICD-10-CM | POA: Diagnosis present

## 2020-04-13 DIAGNOSIS — M436 Torticollis: Secondary | ICD-10-CM | POA: Diagnosis present

## 2020-04-13 DIAGNOSIS — F600 Paranoid personality disorder: Secondary | ICD-10-CM | POA: Diagnosis present

## 2020-04-13 DIAGNOSIS — F1290 Cannabis use, unspecified, uncomplicated: Secondary | ICD-10-CM | POA: Diagnosis present

## 2020-04-13 DIAGNOSIS — F29 Unspecified psychosis not due to a substance or known physiological condition: Secondary | ICD-10-CM | POA: Diagnosis present

## 2020-04-13 DIAGNOSIS — F809 Developmental disorder of speech and language, unspecified: Secondary | ICD-10-CM | POA: Diagnosis present

## 2020-04-13 DIAGNOSIS — F322 Major depressive disorder, single episode, severe without psychotic features: Principal | ICD-10-CM | POA: Diagnosis present

## 2020-04-13 LAB — LIPID PANEL
Cholesterol: 122 mg/dL (ref 0–200)
HDL: 48 mg/dL (ref >40)
LDL Cholesterol: 64 mg/dL (ref 0–99)
Total CHOL/HDL Ratio: 2.5 RATIO
Triglycerides: 50 mg/dL (ref <150)
VLDL: 10 mg/dL (ref 0–40)

## 2020-04-13 LAB — HEMOGLOBIN A1C
Hgb A1c MFr Bld: 4.7 % — ABNORMAL LOW (ref 4.8–5.6)
Mean Plasma Glucose: 88.19 mg/dL

## 2020-04-13 MED ORDER — ACETAMINOPHEN 325 MG PO TABS
650.0000 mg | ORAL_TABLET | Freq: Four times a day (QID) | ORAL | Status: DC | PRN
  Administered 2020-04-14 – 2020-05-09 (×10): 650 mg via ORAL
  Filled 2020-04-13 (×10): qty 2

## 2020-04-13 MED ORDER — MAGNESIUM HYDROXIDE 400 MG/5ML PO SUSP: 30.0000 mL | Freq: Every day | ORAL | Status: DC | PRN

## 2020-04-13 MED ORDER — ALUM & MAG HYDROXIDE-SIMETH 200-200-20 MG/5ML PO SUSP: 30.0000 mL | ORAL | Status: DC | PRN

## 2020-04-13 MED ORDER — ENSURE ENLIVE PO LIQD
237.0000 mL | Freq: Two times a day (BID) | ORAL | Status: DC
  Administered 2020-04-13 – 2020-05-16 (×56): 237 mL via ORAL

## 2020-04-13 MED ORDER — ZIPRASIDONE MESYLATE 20 MG IM SOLR
20.0000 mg | Freq: Four times a day (QID) | INTRAMUSCULAR | Status: DC | PRN
  Administered 2020-04-13 – 2020-04-14 (×3): 20 mg via INTRAMUSCULAR
  Filled 2020-04-13 (×3): qty 20

## 2020-04-13 MED ORDER — QUETIAPINE FUMARATE 25 MG PO TABS
50.0000 mg | ORAL_TABLET | Freq: Two times a day (BID) | ORAL | Status: DC
  Administered 2020-04-13 – 2020-04-14 (×3): 50 mg via ORAL
  Filled 2020-04-13 (×3): qty 2

## 2020-04-13 MED ORDER — TRAZODONE HCL 100 MG PO TABS
100.0000 mg | ORAL_TABLET | Freq: Every evening | ORAL | Status: DC | PRN
  Administered 2020-04-14 – 2020-05-07 (×10): 100 mg via ORAL
  Filled 2020-04-13 (×12): qty 1

## 2020-04-13 MED ORDER — HYDROXYZINE HCL 50 MG PO TABS
50.0000 mg | ORAL_TABLET | Freq: Three times a day (TID) | ORAL | Status: DC | PRN
  Administered 2020-04-14 – 2020-05-13 (×13): 50 mg via ORAL
  Filled 2020-04-13 (×14): qty 1

## 2020-04-13 NOTE — [facility] Counselor (Signed)
CSW attempted to complete the patient's PSA.  Patient began to [facility] "I want my dad" over and over again and began to tremble.  Patient appeared to be experiencing psychosis.  CSW will attempt again.  Penni Homans, MSW, LCSW 04/13/2020 9:51 AM

## 2020-04-13 NOTE — H&P (Signed)
Psychiatric Admission Assessment Adult  Patient Identification: Robin Hess MRN:  [id number] Date of Evaluation:  04/13/2020 Chief Complaint:  Psychosis [facility]) [F29] Principal Diagnosis: Psychosis (HCC) Diagnosis:  Principal Problem:   Psychosis (HCC) Active Problems:   Social anxiety disorder   OCD (obsessive compulsive disorder)  History of Present Illness: 19 year old woman brought into the hospital under IVC Patient was brought in by EMS after running away from family running out through the woods with bizarre talking behavior. Reported by EMS that the patient was muttering to herself about being guilty of something or of having killed someone.   Today patient was seen during treatment team and again one-on-one at bedside. She is very anxious, trembling, and appears to be responding to internal stimuli. She exhibits thought blocking and delayed speech, and is unable to participate well in interview. She notes she wants help, but is unable to elaborate. When asked if she was thinking about harming herself she says "I guess so." When asked if she thought she had harmed someone else she says, "I think so" and begins crying. She continues to repeat she wants her father back. Reassurance provided that father was alive and well. Seroquel 50 mg BID started for mood and acute psychosis.   Associated Signs/Symptoms: Depression Symptoms:  insomnia, suicidal thoughts without plan, Duration of Depression Symptoms: No data recorded (Hypo) Manic Symptoms:  Distractibility, Impulsivity, Labiality of Mood, Anxiety Symptoms:  Excessive Worry, Panic Symptoms, Social Anxiety, Psychotic Symptoms:  Delusions, Paranoia, Duration of Psychotic Symptoms: No data recorded PTSD Symptoms: Negative Total Time spent with patient: 45 minutes  Past Psychiatric History: Patient has had 2 prior hospitalizations in 2015 and 2019.  Last diagnosis was depression and OCD.  Did not follow-up with outpatient  treatment.  Not currently getting any outpatient treatment.  Has made suicidal statements in the past.  Recently had not been trying to hurt her self or showing any violence to others.  Some history of cannabis use in the past but currently does not seem to be an active issue.  Is the patient at risk to self? Yes.    Has the patient been a risk to self in the past 6 months? Yes.    Has the patient been a risk to self within the distant past? Yes.    Is the patient a risk to others? No.  Has the patient been a risk to others in the past 6 months? No.  Has the patient been a risk to others within the distant past? No.   Prior Inpatient Therapy:   Prior Outpatient Therapy:    Alcohol Screening: 1. How often do you have a drink containing alcohol?: Never 2. How many drinks containing alcohol do you have on a typical day when you are drinking?: 1 or 2 3. How often do you have six or more drinks on one occasion?: Never AUDIT-C Score: 0 4. How often during the last year have you found that you were not able to stop drinking once you had started?: Never 5. How often during the last year have you failed to do what was normally expected from you because of drinking?: Never 6. How often during the last year have you needed a first drink in the morning to get yourself going after a heavy drinking session?: Never 7. How often during the last year have you had a feeling of guilt of remorse after drinking?: Never 8. How often during the last year have you been unable to remember what happened  the night before because you had been drinking?: Never 9. Have you or someone else been injured as a result of your drinking?: No 10. Has a relative or friend or a doctor or another health worker been concerned about your drinking or suggested you cut down?: No Alcohol Use Disorder Identification Test Final Score (AUDIT): 0 Alcohol Brief Interventions/Follow-up: AUDIT Score <7 follow-up not indicated Substance Abuse  History in the last 12 months:  No. Consequences of Substance Abuse: Negative Previous Psychotropic Medications: Yes  Psychological Evaluations: Yes  Past Medical History:  Past Medical History:  Diagnosis Date  . Allergy   . Anxiety   . Cannabis use disorder, moderate, dependence (HCC)   . Depression   . OCD (obsessive compulsive disorder)   . Severe major depression, single episode, with psychotic features (HCC)   . Social anxiety disorder    History reviewed. No pertinent surgical history. Family History: History reviewed. No pertinent family history. Family Psychiatric  History: None reported Tobacco Screening: Have you used any form of tobacco in the last 30 days? (Cigarettes, Smokeless Tobacco, Cigars, and/or Pipes): No Social History:  Social History   Substance and Sexual Activity  Alcohol Use No     Social History   Substance and Sexual Activity  Drug Use Yes  . Types: Marijuana    Additional Social History:                           Allergies:   Allergies  Allergen Reactions  . Amoxicillin Rash    REACTION: Rash REACTION: Rash   Lab Results:  Results for orders placed or performed during the hospital encounter of 04/12/20 (from the past 48 hour(s))  CBC     Status: Abnormal   Collection Time: 04/12/20 10:20 AM  Result Value Ref Range   WBC 15.0 (H) 4.0 - 10.5 K/uL   RBC 5.13 (H) 3.87 - 5.11 MIL/uL   Hemoglobin 14.7 12.0 - 15.0 g/dL   HCT 16.1 36 - 46 %   MCV 83.0 80.0 - 100.0 fL   MCH 28.7 26.0 - 34.0 pg   MCHC 34.5 30.0 - 36.0 g/dL   RDW 09.6 04.5 - 40.9 %   Platelets 287 150 - 400 K/uL   nRBC 0.0 0.0 - 0.2 %    Comment: Performed at [facility], [address] [place], [place] [address]  Comprehensive metabolic panel     Status: Abnormal   Collection Time: 04/12/20 10:20 AM  Result Value Ref Range   Sodium 138 135 - 145 mmol/L   Potassium 3.2 (L) 3.5 - 5.1 mmol/L   Chloride 105 98 - 111 mmol/L   CO2 21 (L) 22 - 32  mmol/L   Glucose, Bld 98 70 - 99 mg/dL    Comment: Glucose reference range applies only to samples taken after fasting for at least 8 hours.   BUN 7 6 - 20 mg/dL   Creatinine, Ser 4.78 0.44 - 1.00 mg/dL   Calcium 9.5 8.9 - 29.5 mg/dL   Total Protein 7.8 6.5 - 8.1 g/dL   Albumin 4.6 3.5 - 5.0 g/dL   AST 34 15 - 41 U/L   ALT 25 0 - 44 U/L   Alkaline Phosphatase 50 38 - 126 U/L   Total Bilirubin 0.8 0.3 - 1.2 mg/dL   GFR, Estimated >62 >13 mL/min    Comment: (NOTE) Calculated using the CKD-EPI Creatinine Equation (2021)    Anion gap  12 5 - 15    Comment: Performed at [facility], [address] [place], [place] [address]  Ethanol     Status: None   Collection Time: 04/12/20 10:20 AM  Result Value Ref Range   Alcohol, Ethyl (B) <10 <10 mg/dL    Comment: (NOTE) Lowest detectable limit for serum alcohol is 10 mg/dL.  For medical purposes only. Performed at [facility], [address] [place], [place] [address]   Salicylate level     Status: Abnormal   Collection Time: 04/12/20 10:20 AM  Result Value Ref Range   Salicylate Lvl <7.0 (L) 7.0 - 30.0 mg/dL    Comment: Performed at [facility], [address] [place], [place] [address]  Acetaminophen level     Status: Abnormal   Collection Time: 04/12/20 10:20 AM  Result Value Ref Range   Acetaminophen (Tylenol), Serum <10 (L) 10 - 30 ug/mL    Comment: (NOTE) Therapeutic concentrations vary significantly. A range of 10-30 ug/mL  may be an effective concentration for many patients. However, some  are best treated at concentrations outside of this range. Acetaminophen concentrations >150 ug/mL at 4 hours after ingestion  and >50 ug/mL at 12 hours after ingestion are often associated with  toxic reactions.  Performed at [facility], [address], [place], [place] [address]   Urine Drug Screen, [facility] only)     Status: None   Collection Time: 04/12/20 10:57 AM  Result  Value Ref Range   Tricyclic, Ur Screen NONE DETECTED NONE DETECTED   Amphetamines, Ur Screen NONE DETECTED NONE DETECTED   MDMA (Ecstasy)Ur Screen NONE DETECTED NONE DETECTED   Cocaine Metabolite,Ur [place] NONE DETECTED NONE DETECTED   Opiate, Ur Screen NONE DETECTED NONE DETECTED   Phencyclidine (PCP) Ur S NONE DETECTED NONE DETECTED   Cannabinoid 50 Ng, Ur [place] NONE DETECTED NONE DETECTED   Barbiturates, Ur Screen NONE DETECTED NONE DETECTED   Benzodiazepine, Ur Scrn NONE DETECTED NONE DETECTED   Methadone Scn, Ur NONE DETECTED NONE DETECTED    Comment: (NOTE) Tricyclics + metabolites, urine    Cutoff 1000 ng/mL Amphetamines + metabolites, urine  Cutoff 1000 ng/mL MDMA (Ecstasy), urine              Cutoff 500 ng/mL Cocaine Metabolite, urine          Cutoff 300 ng/mL Opiate + metabolites, urine        Cutoff 300 ng/mL Phencyclidine (PCP), urine         Cutoff 25 ng/mL Cannabinoid, urine                 Cutoff 50 ng/mL Barbiturates + metabolites, urine  Cutoff 200 ng/mL Benzodiazepine, urine              Cutoff 200 ng/mL Methadone, urine                   Cutoff 300 ng/mL  The urine drug screen provides only a preliminary, unconfirmed analytical test result and should not be used for non-medical purposes. Clinical consideration and professional judgment should be applied to any positive drug screen result due to possible interfering substances. A more specific alternate chemical method must be used in order to obtain a confirmed analytical result. Gas chromatography / mass spectrometry (GC/MS) is the preferred confirm atory method. Performed at [facility], [address], [place], [place] [address]   Pregnancy, urine     Status: None  Collection Time: 04/12/20 10:57 AM  Result Value Ref Range   Preg Test, Ur NEGATIVE NEGATIVE    Comment: Performed at [facility], [address] [place], [place] [address]  Resp Panel by RT-PCR (Flu A&B, Covid) Nasopharyngeal  Swab     Status: None   Collection Time: 04/12/20  6:03 PM   Specimen: Nasopharyngeal Swab; Nasopharyngeal(NP) swabs in vial transport medium  Result Value Ref Range   SARS Coronavirus 2 by RT PCR NEGATIVE NEGATIVE    Comment: (NOTE) SARS-CoV-2 target nucleic acids are NOT DETECTED.  The SARS-CoV-2 RNA is generally detectable in upper respiratory specimens during the acute phase of infection. The lowest concentration of SARS-CoV-2 viral copies this assay can detect is 138 copies/mL. A negative result does not preclude SARS-Cov-2 infection and should not be used as the sole basis for treatment or other patient management decisions. A negative result may occur with  improper specimen collection/handling, submission of specimen other than nasopharyngeal swab, presence of viral mutation(s) within the areas targeted by this assay, and inadequate number of viral copies(<138 copies/mL). A negative result must be combined with clinical observations, patient history, and epidemiological information. The expected result is Negative.  Fact Sheet for Patients:  BloggerCourse.com  Fact Sheet for Healthcare Providers:  SeriousBroker.it  This test is no t yet approved or cleared by the [place] FDA and  has been authorized for detection and/or diagnosis of SARS-CoV-2 by FDA under an Emergency Use Authorization (EUA). This EUA will remain  in effect (meaning this test can be used) for the duration of the COVID-19 declaration under Section 564(b)(1) of the Act, 21 U.S.C.section 360bbb-3(b)(1), unless the authorization is terminated  or revoked sooner.       Influenza A by PCR NEGATIVE NEGATIVE   Influenza B by PCR NEGATIVE NEGATIVE    Comment: (NOTE) The Xpert Xpress SARS-CoV-2/FLU/RSV plus assay is intended as an aid in the diagnosis of influenza from Nasopharyngeal swab specimens and should not be used as a sole basis for treatment.  Nasal washings and aspirates are unacceptable for Xpert Xpress SARS-CoV-2/FLU/RSV testing.  Fact Sheet for Patients: BloggerCourse.com  Fact Sheet for Healthcare Providers: SeriousBroker.it  This test is not yet approved or cleared by the [place] FDA and has been authorized for detection and/or diagnosis of SARS-CoV-2 by FDA under an Emergency Use Authorization (EUA). This EUA will remain in effect (meaning this test can be used) for the duration of the COVID-19 declaration under Section 564(b)(1) of the Act, 21 U.S.C. section 360bbb-3(b)(1), unless the authorization is terminated or revoked.  Performed at [facility], [address] [place], [place] [address]     Blood Alcohol level:  Lab Results  Component Value Date   [facility] <10 04/12/2020   ETH <10 03/03/2018    Metabolic Disorder Labs:  Lab Results  Component Value Date   HGBA1C 4.9 03/03/2018   MPG 93.93 03/03/2018   Lab Results  Component Value Date   PROLACTIN 17.5 05/02/2014   Lab Results  Component Value Date   CHOL 153 03/03/2018   TRIG 70 03/03/2018   HDL 49 03/03/2018   CHOLHDL 3.1 03/03/2018   VLDL 14 03/03/2018   LDLCALC 90 03/03/2018   LDLCALC 89 05/02/2014    Current Medications: Current Facility-Administered Medications  Medication Dose Route Frequency Provider Last Rate Last Admin  . acetaminophen (TYLENOL) tablet 650 mg  650 mg Oral Q6H PRN Clapacs, [name], MD      . alum &  mag hydroxide-simeth (MAALOX/MYLANTA) 200-200-20 MG/5ML suspension 30 mL  30 mL Oral Q4H PRN Clapacs, [name], MD      . hydrOXYzine (ATARAX/VISTARIL) tablet 50 mg  50 mg Oral TID PRN Clapacs, [name], MD      . magnesium hydroxide (MILK OF MAGNESIA) suspension 30 mL  30 mL Oral Daily PRN Clapacs, [name], MD      . QUEtiapine (SEROQUEL) tablet 50 mg  50 mg Oral BID Jesse SansFreeman, Azucena Dart M, MD   50 mg at 04/13/20 0941  . traZODone (DESYREL) tablet 100 mg  100 mg  Oral QHS PRN Clapacs, Jackquline DenmarkJohn T, MD       PTA Medications: Medications Prior to Admission  Medication Sig Dispense Refill Last Dose  . fluvoxaMINE (LUVOX) 100 MG tablet Take 1 tablet (100 mg total) by mouth at bedtime. (Patient not taking: Reported on 04/12/2020) 30 tablet 1   . hydrOXYzine (ATARAX/VISTARIL) 50 MG tablet Take 1 tablet (50 mg total) by mouth 3 (three) times daily as needed for anxiety. (Patient not taking: Reported on 04/12/2020) 90 tablet 1   . lamoTRIgine (LAMICTAL) 25 MG tablet Take 1 tablet (25 mg total) by mouth at bedtime. Take 1 tab for two weeks, 2 tabs for 2 weeks, 4 tabs for 2 weeks, 8 tabs or 200 mg after (Patient not taking: Reported on 04/12/2020) 45 tablet 1   . prazosin (MINIPRESS) 1 MG capsule Take 1 capsule (1 mg total) by mouth 2 (two) times daily. (Patient not taking: Reported on 04/12/2020) 60 capsule 1   . traZODone (DESYREL) 100 MG tablet Take 1 tablet (100 mg total) by mouth at bedtime as needed for sleep. (Patient not taking: Reported on 04/12/2020) 30 tablet 1     Musculoskeletal: Strength & Muscle Tone: within normal limits Gait & Station: normal Patient leans: N/A  Psychiatric Specialty Exam: Physical Exam Vitals and nursing note reviewed.  Constitutional:      General: She is in acute distress.  HENT:     Head: Normocephalic and atraumatic.     Nose: Nose normal.     Mouth/Throat:     Mouth: Mucous membranes are moist.     Pharynx: Oropharynx is clear.  Eyes:     Extraocular Movements: Extraocular movements intact.     Conjunctiva/sclera: Conjunctivae normal.     Pupils: Pupils are equal, round, and reactive to light.  Cardiovascular:     Rate and Rhythm: Normal rate.     Pulses: Normal pulses.  Pulmonary:     Effort: Pulmonary effort is normal.     Breath sounds: Normal breath sounds.  Abdominal:     General: Abdomen is flat.     Palpations: Abdomen is soft.  Musculoskeletal:        General: No swelling. Normal range of motion.      Cervical back: Normal range of motion and neck supple.  Skin:    General: Skin is warm and dry.  Neurological:     Mental Status: She is alert. She is disoriented.     Cranial Nerves: No cranial nerve deficit.     Gait: Gait normal.  Psychiatric:        Attention and Perception: She is inattentive. She perceives auditory hallucinations.        Mood and Affect: Mood is anxious. Affect is tearful.        Speech: Speech is delayed and tangential.        Behavior: Behavior is slowed and withdrawn.  Thought Content: Thought content is paranoid and delusional. Thought content includes suicidal ideation.        Cognition and Memory: Cognition is impaired. She exhibits impaired recent memory.        Judgment: Judgment is impulsive.     Review of Systems  Constitutional: Negative for appetite change and fatigue.  HENT: Negative for rhinorrhea and sinus pressure.   Eyes: Negative for photophobia and visual disturbance.  Respiratory: Negative for cough and shortness of breath.   Cardiovascular: Negative for chest pain and palpitations.  Gastrointestinal: Negative for blood in stool, constipation, diarrhea, nausea and vomiting.  Endocrine: Negative for cold intolerance and heat intolerance.  Genitourinary: Negative for difficulty urinating and dysuria.  Musculoskeletal: Negative for arthralgias and myalgias.  Skin: Negative for rash and wound.  Allergic/Immunologic: Negative for food allergies and immunocompromised state.  Neurological: Negative for dizziness and headaches.  Hematological: Negative for adenopathy. Does not bruise/bleed easily.  Psychiatric/Behavioral: Positive for confusion, decreased concentration, hallucinations, sleep disturbance and suicidal ideas. The patient is nervous/anxious.     Blood pressure 103/70, pulse (!) 113, temperature 98.4 F (36.9 C), temperature source Oral, resp. rate 18, height  (1.803 m), weight 63.5 kg, last menstrual period 03/30/2020,  SpO2 99 %.Body mass index is 19.53 kg/m.  General Appearance: Disheveled  Eye Contact:  Minimal  Speech:  Blocked and Slow  Volume:  Decreased  Mood:  Anxious  Affect:  Congruent  Thought Process:  Disorganized  Orientation:  Negative  Thought Content:  Illogical, Delusions, Paranoid Ideation and Rumination  Suicidal Thoughts:  Yes.  without intent/plan  Homicidal Thoughts:  No  Memory:  Immediate;   Poor Recent;   Poor Remote;   Poor  Judgement:  Impaired  Insight:  Shallow  Psychomotor Activity:  Decreased  Concentration:  Concentration: Poor and Attention Span: Poor  Recall:  Poor  Fund of Knowledge:  Poor  Language:  Fair  Akathisia:  Negative  Handed:  Right  AIMS (if indicated):     Assets:  Desire for Improvement Housing Physical Health Social Support  ADL's:  Impaired  Cognition:  Impaired,  Mild  Sleep:  Number of Hours: 5.15       Treatment Plan Summary: Daily contact with patient to assess and evaluate symptoms and progress in treatment and Medication management PLAN OF CARE: 19 year old woman with a history depression and OCD not currently in any treatment. Immediate behavior consistent with acute psychosis. Patient is living with her family, not working, not in school, low functioning. Differential diagnosis includes new onset schizophrenia, psychotic depression, bipolar disorder with psychosis. Continue IVC. Start Seroquel 50 mg BID for acute psychosis and mood, titrate as necessary.   Observation Level/Precautions:  15 minute checks  Laboratory:  HbAIC lipid panel  Psychotherapy:    Medications:    Consultations:    Discharge Concerns:    Estimated LOS:  Other:     Physician Treatment Plan for Primary Diagnosis: Psychosis (HCC) Long Term Goal(s): Improvement in symptoms so as ready for discharge  Short Term Goals: Ability to identify changes in lifestyle to reduce recurrence of condition will improve, Ability to verbalize feelings will improve,  Ability to disclose and discuss suicidal ideas, Ability to demonstrate self-control will improve, Ability to identify and develop effective coping behaviors will improve and Compliance with prescribed medications will improve  Physician Treatment Plan for Secondary Diagnosis: Principal Problem:   Psychosis (HCC) Active Problems:   Social anxiety disorder   OCD (obsessive compulsive disorder)  Long  Term Goal(s): Improvement in symptoms so as ready for discharge  Short Term Goals: Ability to identify changes in lifestyle to reduce recurrence of condition will improve, Ability to verbalize feelings will improve, Ability to disclose and discuss suicidal ideas, Ability to demonstrate self-control will improve, Ability to identify and develop effective coping behaviors will improve and Compliance with prescribed medications will improve  I certify that inpatient services furnished can reasonably be expected to improve the patient's condition.    [name], MD 11/19/202111:24 AM

## 2020-04-13 NOTE — Progress Notes (Addendum)
Recreation Therapy Notes     Date: 04/13/2020  Time: 9:30 am   Location: Craft room     Behavioral response: N/A   Intervention Topic: Problem Solving    Discussion/Intervention: Patient did not attend group.   Clinical Observations/Feedback:  Patient did not attend group.   Samiyah Stupka LRT/CTRS         Hasel Janish 04/13/2020 12:02 PM

## 2020-04-13 NOTE — Progress Notes (Signed)
Patient stormed out of her room states " I don't know anything." Patient looks frightened and anxious,running up and down in the hallway.IM [place] given in the presence of security.Patient had ensure and fluids.Refused dinner.Patient in bed at this time.Support and resasurance given.

## 2020-04-13 NOTE — [facility] Counselor (Signed)
CSW met with patient again in effort to complete PSA. Patient reports that she is not doing well and requested to try later.  Assunta Curtis, MSW, LCSW 04/13/2020 1:28 PM

## 2020-04-13 NOTE — [facility] Suicide Risk Assessment (Signed)
[facility] Admission Suicide Risk Assessment   Nursing information obtained from:  Patient Demographic factors:  Caucasian, Low socioeconomic status Current Mental Status:  NA Loss Factors:  Decline in physical health Historical Factors:  Impulsivity Risk Reduction Factors:  Religious beliefs about death  Total Time spent with patient: 30 minutes Principal Problem: <principal problem not specified> Diagnosis:  Active Problems:   Psychosis (HCC)  Subjective Data: 19 year old woman brought into the hospital under IVC Patient was brought in by EMS after running away from family running out through the woods with bizarre talking behavior.  Reported by EMS that the patient was muttering to herself about being guilty of something or of having killed someone.   Today patient was seen during treatment team and again one-on-one at bedside. She is very anxious, trembling, and appears to be responding to internal stimuli. She exhibits thought blocking and delayed speech, and is unable to participate well in interview. She notes she wants help, but is unable to elaborate. When asked if she was thinking about harming herself she says "I guess so." When asked if she thought she had harmed someone else she says, "I think so" and begins crying. She continues to repeat she wants her father back. Reassurance provided that father was alive and well. Seroquel 50 mg BID started for mood and acute psychosis.   Continued Clinical Symptoms:  Alcohol Use Disorder Identification Test Final Score (AUDIT): 0 The "Alcohol Use Disorders Identification Test", Guidelines for Use in Primary Care, Second Edition.  World Science writer [facility]). Score between 0-7:  no or low risk or alcohol related problems. Score between 8-15:  moderate risk of alcohol related problems. Score between 16-19:  high risk of alcohol related problems. Score 20 or above:  warrants further diagnostic evaluation for alcohol dependence and  treatment.   CLINICAL FACTORS:   Severe Anxiety and/or Agitation Currently Psychotic Unstable or Poor Therapeutic Relationship Previous Psychiatric Diagnoses and Treatments   Musculoskeletal: Strength & Muscle Tone: within normal limits Gait & Station: normal Patient leans: N/A  Psychiatric Specialty Exam: Physical Exam Vitals and nursing note reviewed.  Constitutional:      General: She is in acute distress.  HENT:     Head: Normocephalic and atraumatic.     Nose: Nose normal.     Mouth/Throat:     Mouth: Mucous membranes are moist.     Pharynx: Oropharynx is clear.  Eyes:     Extraocular Movements: Extraocular movements intact.     Conjunctiva/sclera: Conjunctivae normal.     Pupils: Pupils are equal, round, and reactive to light.  Cardiovascular:     Rate and Rhythm: Normal rate.     Pulses: Normal pulses.  Pulmonary:     Effort: Pulmonary effort is normal.     Breath sounds: Normal breath sounds.  Abdominal:     General: Abdomen is flat.     Palpations: Abdomen is soft.  Musculoskeletal:        General: No swelling. Normal range of motion.     Cervical back: Normal range of motion and neck supple.  Skin:    General: Skin is warm and dry.  Neurological:     Mental Status: She is alert. She is disoriented.     Cranial Nerves: No cranial nerve deficit.     Gait: Gait normal.  Psychiatric:        Attention and Perception: She is inattentive. She perceives auditory hallucinations.        Mood and Affect: Mood  is anxious. Affect is tearful.        Speech: Speech is delayed and tangential.        Behavior: Behavior is slowed and withdrawn.        Thought Content: Thought content is paranoid and delusional. Thought content includes suicidal ideation.        Cognition and Memory: Cognition is impaired. She exhibits impaired recent memory.        Judgment: Judgment is impulsive.     Review of Systems  Constitutional: Negative for appetite change and fatigue.   HENT: Negative for rhinorrhea and sinus pressure.   Eyes: Negative for photophobia and visual disturbance.  Respiratory: Negative for cough and shortness of breath.   Cardiovascular: Negative for chest pain and palpitations.  Gastrointestinal: Negative for blood in stool, constipation, diarrhea, nausea and vomiting.  Endocrine: Negative for cold intolerance and heat intolerance.  Genitourinary: Negative for difficulty urinating and dysuria.  Musculoskeletal: Negative for arthralgias and myalgias.  Skin: Negative for rash and wound.  Allergic/Immunologic: Negative for food allergies and immunocompromised state.  Neurological: Negative for dizziness and headaches.  Hematological: Negative for adenopathy. Does not bruise/bleed easily.  Psychiatric/Behavioral: Positive for confusion, decreased concentration, hallucinations, sleep disturbance and suicidal ideas. The patient is nervous/anxious.     Blood pressure 103/70, pulse (!) 113, temperature 98.4 F (36.9 C), temperature source Oral, resp. rate 18, height 5\' 11"  (1.803 m), weight 63.5 kg, last menstrual period 03/30/2020, SpO2 99 %.Body mass index is 19.53 kg/m.  General Appearance: Disheveled  Eye Contact:  Minimal  Speech:  Blocked and Slow  Volume:  Decreased  Mood:  Anxious  Affect:  Congruent  Thought Process:  Disorganized  Orientation:  Negative  Thought Content:  Illogical, Delusions, Paranoid Ideation and Rumination  Suicidal Thoughts:  Yes.  without intent/plan  Homicidal Thoughts:  No  Memory:  Immediate;   Poor Recent;   Poor Remote;   Poor  Judgement:  Impaired  Insight:  Shallow  Psychomotor Activity:  Decreased  Concentration:  Concentration: Poor and Attention Span: Poor  Recall:  Poor  Fund of Knowledge:  Poor  Language:  Fair  Akathisia:  Negative  Handed:  Right  AIMS (if indicated):     Assets:  Desire for Improvement Housing Physical Health Social Support  ADL's:  Impaired  Cognition:  Impaired,   Mild  Sleep:  Number of Hours: 5.15      COGNITIVE FEATURES THAT CONTRIBUTE TO RISK:  Loss of executive function    SUICIDE RISK:   Moderate:  Frequent suicidal ideation with limited intensity, and duration, some specificity in terms of plans, no associated intent, good self-control, limited dysphoria/symptomatology, some risk factors present, and identifiable protective factors, including available and accessible social support.  PLAN OF CARE: 18 year old woman with a history depression and OCD not currently in any treatment.  Immediate behavior consistent with acute psychosis.   Patient is living with her family, not working, not in school, low functioning.  Differential diagnosis includes new onset schizophrenia, psychotic depression, bipolar disorder with psychosis. Continue IVC. Start Seroquel 50 mg BID for acute psychosis and mood, titrate as necessary.   I certify that inpatient services furnished can reasonably be expected to improve the patient's condition.   24, MD 04/13/2020, 11:12 AM

## 2020-04-13 NOTE — Progress Notes (Signed)
Recreation Therapy Notes  INPATIENT RECREATION THERAPY ASSESSMENT  Patient Details Name: Robin Hess MRN: [id number] DOB: 04/24/2001 Today's Date: 04/13/2020       Information Obtained From: Patient (Unable at this time)  Able to Participate in Assessment/Interview:    Patient Presentation:    Reason for Admission (Per Patient):    Patient Stressors:    Coping Skills:      Leisure Interests (2+):     Frequency of Recreation/Participation:    Awareness of Community Resources:     [employer]:     Current Use:    If no, Barriers?:    Expressed Interest in [employer] Information:    [place] of Residence:     Patient Main Form of Transportation:    Patient Strengths:     Patient Identified Areas of Improvement:     Patient Goal for Hospitalization:     Current SI (including self-harm):     Current HI:     Current AVH:    Staff Intervention Plan:    Consent to Intern Participation:    Wilhemenia Camba 04/13/2020, 2:59 PM

## 2020-04-13 NOTE — Tx Team (Addendum)
Interdisciplinary Treatment and Diagnostic Plan Update  04/13/2020 Time of Session: 5:18AC Robin Hess MRN: [id number]  Principal Diagnosis: <principal problem not specified>  Secondary Diagnoses: Active Problems:   Psychosis (Home)   Current Medications:  Current Facility-Administered Medications  Medication Dose Route Frequency Provider Last Rate Last Admin  . acetaminophen (TYLENOL) tablet 650 mg  650 mg Oral Q6H PRN Clapacs, [name], MD      . alum & mag hydroxide-simeth (MAALOX/MYLANTA) 200-200-20 MG/5ML suspension 30 mL  30 mL Oral Q4H PRN Clapacs, [name], MD      . hydrOXYzine (ATARAX/VISTARIL) tablet 50 mg  50 mg Oral TID PRN Clapacs, [name], MD      . magnesium hydroxide (MILK OF MAGNESIA) suspension 30 mL  30 mL Oral Daily PRN Clapacs, [name], MD      . QUEtiapine (SEROQUEL) tablet 50 mg  50 mg Oral BID Salley Scarlet, MD   50 mg at 04/13/20 0941  . traZODone (DESYREL) tablet 100 mg  100 mg Oral QHS PRN Clapacs, Madie Reno, MD       PTA Medications: Medications Prior to Admission  Medication Sig Dispense Refill Last Dose  . fluvoxaMINE (LUVOX) 100 MG tablet Take 1 tablet (100 mg total) by mouth at bedtime. (Patient not taking: Reported on 04/12/2020) 30 tablet 1   . hydrOXYzine (ATARAX/VISTARIL) 50 MG tablet Take 1 tablet (50 mg total) by mouth 3 (three) times daily as needed for anxiety. (Patient not taking: Reported on 04/12/2020) 90 tablet 1   . lamoTRIgine (LAMICTAL) 25 MG tablet Take 1 tablet (25 mg total) by mouth at bedtime. Take 1 tab for two weeks, 2 tabs for 2 weeks, 4 tabs for 2 weeks, 8 tabs or 200 mg after (Patient not taking: Reported on 04/12/2020) 45 tablet 1   . prazosin (MINIPRESS) 1 MG capsule Take 1 capsule (1 mg total) by mouth 2 (two) times daily. (Patient not taking: Reported on 04/12/2020) 60 capsule 1   . traZODone (DESYREL) 100 MG tablet Take 1 tablet (100 mg total) by mouth at bedtime as needed for sleep. (Patient not taking: Reported on 04/12/2020) 30  tablet 1     Patient Stressors:    Patient Strengths:    Treatment Modalities: Medication Management, Group therapy, Case management,  1 to 1 session with clinician, Psychoeducation, Recreational therapy.   Physician Treatment Plan for Primary Diagnosis: <principal problem not specified> Long Term Goal(s):     Short Term Goals:    Medication Management: Evaluate patient's response, side effects, and tolerance of medication regimen.  Therapeutic Interventions: 1 to 1 sessions, Unit Group sessions and Medication administration.  Evaluation of Outcomes: Not Met  Physician Treatment Plan for Secondary Diagnosis: Active Problems:   Psychosis ([place])  Long Term Goal(s):     Short Term Goals:       Medication Management: Evaluate patient's response, side effects, and tolerance of medication regimen.  Therapeutic Interventions: 1 to 1 sessions, Unit Group sessions and Medication administration.  Evaluation of Outcomes: Not Met   RN Treatment Plan for Primary Diagnosis: <principal problem not specified> Long Term Goal(s): Knowledge of disease and therapeutic regimen to maintain health will improve  Short Term Goals: Ability to verbalize frustration and anger appropriately will improve, Ability to participate in decision making will improve, Ability to verbalize feelings will improve, Ability to identify and develop effective coping behaviors will improve and Compliance with prescribed medications will improve  Medication Management: RN will administer medications as ordered by provider,  will assess and evaluate patient's response and provide education to patient for prescribed medication. RN will report any adverse and/or side effects to prescribing provider.  Therapeutic Interventions: 1 on 1 counseling sessions, Psychoeducation, Medication administration, Evaluate responses to treatment, Monitor vital signs and CBGs as ordered, Perform/monitor CIWA, COWS, AIMS and Fall Risk  screenings as ordered, Perform wound care treatments as ordered.  Evaluation of Outcomes: Not Met   LCSW Treatment Plan for Primary Diagnosis: <principal problem not specified> Long Term Goal(s): Safe transition to appropriate next level of care at discharge, Engage patient in therapeutic group addressing interpersonal concerns.  Short Term Goals: Engage patient in aftercare planning with referrals and resources, Increase social support, Increase ability to appropriately verbalize feelings, Facilitate acceptance of mental health diagnosis and concerns, Identify triggers associated with mental health/substance abuse issues and Increase skills for wellness and recovery  Therapeutic Interventions: Assess for all discharge needs, 1 to 1 time with Social worker, Explore available resources and support systems, Assess for adequacy in community support network, Educate family and significant other(s) on suicide prevention, Complete Psychosocial Assessment, Interpersonal group therapy.  Evaluation of Outcomes: Not Met   Progress in Treatment: Attending groups: No. Participating in groups: No. Taking medication as prescribed: Yes. Toleration medication: Yes. Family/Significant other contact made: No, will contact:  when given permission.  Patient understands diagnosis: No. Discussing patient identified problems/goals with staff: No. Medical problems stabilized or resolved: Yes. Denies suicidal/homicidal ideation: Yes. Issues/concerns per patient self-inventory: No. Other: None.  New problem(s) identified: No, Describe:  None.  New Short Term/Long Term Goal(s): elimination of symptoms of psychosis, medication management for mood stabilization; elimination of SI thoughts; development of comprehensive mental wellness/sobriety plan.  Patient Goals: "Work on myself."  Discharge Plan or Barriers: CSW will assist with aftercare planning as needed.   Reason for Continuation of Hospitalization:  Anxiety Delusions  Depression Hallucinations Medication stabilization  Estimated Length of Stay: 1-7 days  Attendees: Patient: Robin Hess [birth year] 10:15 AM  Physician: Selina Cooley, MD 04/13/2020 10:15 AM  Nursing: West Pugh, RN 04/13/2020 10:15 AM  RN Care Manager: 04/13/2020 10:15 AM  Social Worker: Assunta Curtis, MSW, LCSW 04/13/2020 10:15 AM  Recreational Therapist: Roanna Epley, Reather Converse, LRT  04/13/2020 10:15 AM  Other: Paulla Dolly, Latanya Presser, MSW 04/13/2020 10:15 AM  Other: Chalmers Guest. Guerry Bruin, MSW, [place], [place]le[place] 04/13/2020 10:15 AM  Other: 04/13/2020 10:15 AM    Scribe for Treatment Team: Shirl Harris, LCSW 04/13/2020 10:15 AM

## 2020-04-13 NOTE — [facility] Suicide Risk Assessment (Signed)
[facility] INPATIENT:  Family/Significant Other Suicide Prevention Education  Suicide Prevention Education:  Contact Attempts: Robin Hess, father, [phone] has been identified by the patient as the family member/significant other with whom the patient will be residing, and identified as the person(s) who will aid the patient in the event of a mental health crisis.  With written consent from the patient, two attempts were made to provide suicide prevention education, prior to and/or following the patient's discharge.  We were unsuccessful in providing suicide prevention education.  A suicide education pamphlet was given to the patient to share with family/significant other.   Date and time of first attempt: 04/13/2020 at 1:47PM  Date and time of second attempt: Second attempt is needed.  CSW left HIPAA compliant voicemail.  [name] 04/13/2020, 1:46 PM

## 2020-04-13 NOTE — Progress Notes (Signed)
Admission Note:  19 yr Female who presents IVC in no acute distress for the treatment of Psychosis and   Depression. Patient upon arrival was not receptive to staff, she appears flat and depressed, noted responding to internal stimuli, with some delayed response noted. Patient initially was irritable, she was offered emotional support and encouragement, and was later calm and cooperative with admission process. Patient denies SI/HI and contracts for safety upon admission. Patient has Past medical Hx of Allergy, Anxiety, Substance Abuse and OCD. Skin was assessed and found to be clear of any abnormal marks, she was also assessed for contraband non found, POC and unit policies explained, understanding verbalized, and consents obtained. Food and fluids offered, and she accepted it. 15 minutes safety checks maintained will continue to monitor.

## 2020-04-13 NOTE — Plan of Care (Signed)
Patient stated that she is hearing voices and the voices telling her to end up everything.Patient looks anxious and suspicious.Patient states " I am scared of everyone." Patient refused to get out of bed stated that she is tired.Patient got up for breakfast but did not eat much.Support and encouragement given.Will continue monitor.

## 2020-04-14 DIAGNOSIS — F23 Brief psychotic disorder: Secondary | ICD-10-CM | POA: Diagnosis not present

## 2020-04-14 MED ORDER — LORAZEPAM 2 MG/ML IJ SOLN
2.0000 mg | Freq: Four times a day (QID) | INTRAMUSCULAR | Status: DC | PRN
  Administered 2020-04-25 – 2020-04-26 (×2): 2 mg via INTRAMUSCULAR
  Filled 2020-04-14 (×2): qty 1

## 2020-04-14 MED ORDER — QUETIAPINE FUMARATE 200 MG PO TABS: 200.0000 mg | ORAL_TABLET | Freq: Every day | ORAL | Status: DC

## 2020-04-14 MED ORDER — QUETIAPINE FUMARATE 200 MG PO TABS
200.0000 mg | ORAL_TABLET | Freq: Every day | ORAL | Status: DC
  Administered 2020-04-14 – 2020-04-15 (×2): 200 mg via ORAL
  Filled 2020-04-14 (×2): qty 1

## 2020-04-14 MED ORDER — QUETIAPINE FUMARATE 25 MG PO TABS
50.0000 mg | ORAL_TABLET | Freq: Every day | ORAL | Status: DC
  Administered 2020-04-15 – 2020-04-24 (×10): 50 mg via ORAL
  Filled 2020-04-14 (×10): qty 2

## 2020-04-14 MED ORDER — LORAZEPAM 2 MG PO TABS
2.0000 mg | ORAL_TABLET | Freq: Four times a day (QID) | ORAL | Status: DC | PRN
  Administered 2020-04-14 – 2020-05-11 (×11): 2 mg via ORAL
  Filled 2020-04-14 (×17): qty 1

## 2020-04-14 NOTE — [facility] Group Notes (Signed)
   LCSW Group Therapy Note    04/14/2020: 1:30 PM- 2:15 PM     Type of Therapy and Topic:  Group Therapy:  Overcoming Obstacles     Participation Level:  None     Description of Group:     In this group patients will be encouraged to explore what they see as obstacles to their own wellness and recovery. They will be guided to discuss their thoughts, feelings, and behaviors related to these obstacles. The group will process together ways to cope with barriers, with attention given to specific choices patients can make. Each patient will be challenged to identify changes they are motivated to make in order to overcome their obstacles. This group will be process-oriented, with patients participating in exploration of their own experiences as well as giving and receiving support and challenge from other group members.     Therapeutic Goals:  1.    Patient will identify personal and current obstacles as they relate to admission.  2.    Patient will identify barriers that currently interfere with their wellness or overcoming obstacles.  3.    Patient will identify feelings, thought process and behaviors related to these barriers.  4.    Patient will identify two changes they are willing to make to overcome these obstacles:        Summary of Patient Progress: Patient walked into group and stood at the front of the room. CSW asked patient if she would like to sit and join the group. Patient froze in place and did not say anything. Patient is psychotic and had to be escorted back to her room by the techs.          Therapeutic Modalities:    Cognitive Behavioral Therapy  Solution Focused Therapy  Motivational Interviewing  Relapse Prevention Therapy    Susa Simmonds, LCSWA   04/14/2020

## 2020-04-14 NOTE — Progress Notes (Signed)
Patient early in shift out of room in dayroom for snack in no distress. Thanked staff for snack. Later in shift noted banging on walls, attempting to open outside doors. Confused walking back and forth down halls. Geodon given, awaiting effectiveness.

## 2020-04-14 NOTE — [facility] Counselor (Signed)
CSW attempted to complete PSA but patient was asleep.

## 2020-04-14 NOTE — [facility] Counselor (Signed)
CSW attempted to complete PSA after patient ate lunch. Patient looked at the ground and did not answer CSW. CSW asked patient if she was feeling well and she shook her head no and turned over into her bed.

## 2020-04-14 NOTE — Progress Notes (Signed)
Patient sleep at beginning of shift, woke up for snacks.  Later in shift became agitated banging on walls and attempting to go out doors. Prn given with good relief. Patient has body odor staff unable to get her to shower. Encouragement and support provided. Safety checks maintained. Medications given as prescribed. Pt receptive and remains safe on unit with q15 min checks.

## 2020-04-14 NOTE — Progress Notes (Signed)
D: Pt was sleeping at the beginning of the shift. Once awake pt allowed MHT's to take BP and ate a small amount of breakfast. Pt did drink all of her ensure and some of the Gatorade given to her. Pt's BP was low with a high pulse, MD aware. Pt was calm and cooperative for the entire morning, however forwards very little, mostly only nodding head in response to questions asked. Pt did become agitated this afternoon and was escorted back to her room where prn injection was given, with effective relief. Pt has been encouraged to shower with no progress thus far from pt. This evening the pt came out of her room and began pacing the hallway again only this time in circles in front of the equipment room while picking her nose until it bled. Pt proceeded to ly down on the floor in front of the equipment room and would not get up upon request. MHT and RN helped pt up to her feet and escorted pt to her room. Pt lay down on her bed for a few moment before coming out and began pacing again, prn meds were gived.  A: Scheduled medications administered to pt, per MD orders. Support and encouragement provided. Frequent verbal contact made. Routine safety checks conducted q15 minutes.   R: No adverse drug reactions noted. Pt verbally contracts for safety at this time. Pt complaint with medications. Pt interacts minimally with others on the unit. Pt remains safe at this time. Will continue to monitor.

## 2020-04-14 NOTE — Progress Notes (Signed)
[facility] MD Progress Note  04/14/2020 9:51 AM Robin Hess  MRN:  [id number]   Subjective:  19 year old woman brought into the hospital under IVC Patient was brought in by EMS after running away from family running out through the woods with bizarre talking behavior. Reported by EMS that the patient was muttering to herself about being guilty of something or of having killed someone.  Yesterday evening and again overnight patient became frightened, anxious, and began running down hallways and attempting to get out of bedroom window and received IM Geodon PRN for acute agitation. This morning, patient medication complaint, and ate majority of breakfast. She is assessed one-on-one at bedside and she is calm though psychotic. She appears to be responding to internal stimuli, exhibits thought blocking, slow speech, and disorganized thought process. She does answer "yes" when asked if she is suicidal. When asked why she states, "I'm a failure. Never helped anyone." She is able to correctly state her name, and that she is currently in the hospital. She also expresses that she feels depressed. Encouraged patient to brush her teeth and shower today in efforts to help her feel better. Also informed her that we would increase nighttime dose of medication to help with depression and suicidal thoughts.   Principal Problem: Psychosis (HCC) Diagnosis: Principal Problem:   Psychosis (HCC) Active Problems:   Social anxiety disorder   OCD (obsessive compulsive disorder)  Total Time spent with patient: 30 minutes  Past Psychiatric History: Patient has had 2 prior hospitalizations in 2015 and 2019. Last diagnosis was depression and OCD. Did not follow-up with outpatient treatment. Not currently getting any outpatient treatment. Has made suicidal statements in the past. Recently had not been trying to hurt her self or showing any violence to others. Some history of cannabis use in the past but currently does not  seem to be an active issue.  Past Medical History:  Past Medical History:  Diagnosis Date  . Allergy   . Anxiety   . Cannabis use disorder, moderate, dependence (HCC)   . Depression   . OCD (obsessive compulsive disorder)   . Severe major depression, single episode, with psychotic features (HCC)   . Social anxiety disorder    History reviewed. No pertinent surgical history. Family History: History reviewed. No pertinent family history. Family Psychiatric  History: None reported Social History:  Social History   Substance and Sexual Activity  Alcohol Use No     Social History   Substance and Sexual Activity  Drug Use Yes  . Types: Marijuana    Social History   Socioeconomic History  . Marital status: Single    Spouse name: Not on file  . Number of children: Not on file  . Years of education: Not on file  . Highest education level: Not on file  Occupational History  . Not on file  Tobacco Use  . Smoking status: Never Smoker  . Smokeless tobacco: Never Used  Substance and Sexual Activity  . Alcohol use: No  . Drug use: Yes    Types: Marijuana  . Sexual activity: Never    Birth control/protection: None  Other Topics Concern  . Not on file  Social History Narrative  . Not on file   Social Determinants of Health   Financial Resource Strain:   . Difficulty of Paying Living Expenses: Not on file  Food Insecurity:   . Worried About Programme researcher, broadcasting/film/video in the Last Year: Not on file  . Ran Out of Food  in the Last Year: Not on file  Transportation Needs:   . Lack of Transportation (Medical): Not on file  . Lack of Transportation (Non-Medical): Not on file  Physical Activity:   . Days of Exercise per Week: Not on file  . Minutes of Exercise per Session: Not on file  Stress:   . Feeling of Stress : Not on file  Social Connections:   . Frequency of Communication with Friends and Family: Not on file  . Frequency of Social Gatherings with Friends and Family: Not on  file  . Attends Religious Services: Not on file  . Active Member of Clubs or Organizations: Not on file  . Attends BankerClub or Organization Meetings: Not on file  . Marital Status: Not on file   Additional Social History:                         Sleep: Fair  Appetite:  Fair  Current Medications: Current Facility-Administered Medications  Medication Dose Route Frequency Provider Last Rate Last Admin  . acetaminophen (TYLENOL) tablet 650 mg  650 mg Oral Q6H PRN Clapacs, [name], MD      . alum & mag hydroxide-simeth (MAALOX/MYLANTA) 200-200-20 MG/5ML suspension 30 mL  30 mL Oral Q4H PRN Clapacs, [name], MD      . feeding supplement (ENSURE ENLIVE / ENSURE PLUS) liquid 237 mL  237 mL Oral BID BM Jesse SansFreeman, Rachana Malesky M, MD   237 mL at 04/13/20 1420  . hydrOXYzine (ATARAX/VISTARIL) tablet 50 mg  50 mg Oral TID PRN Clapacs, [name], MD      . magnesium hydroxide (MILK OF MAGNESIA) suspension 30 mL  30 mL Oral Daily PRN Clapacs, [name], MD      . QUEtiapine (SEROQUEL) tablet 50 mg  50 mg Oral BID Jesse SansFreeman, Delvecchio Madole M, MD   50 mg at 04/14/20 0835  . traZODone (DESYREL) tablet 100 mg  100 mg Oral QHS PRN Clapacs, [name], MD      . ziprasidone (GEODON) injection 20 mg  20 mg Intramuscular Q6H PRN Jesse SansFreeman, Moria Brophy M, MD   20 mg at 04/14/20 [id number]    Lab Results:  Results for orders placed or performed during the hospital encounter of 04/13/20 (from the past 48 hour(s))  Hemoglobin A1c     Status: Abnormal   Collection Time: 04/13/20  2:29 PM  Result Value Ref Range   Hgb A1c MFr Bld 4.7 (L) 4.8 - 5.6 %    Comment: (NOTE) Pre diabetes:          5.7%-6.4%  Diabetes:              >6.4%  Glycemic control for   <7.0% adults with diabetes    Mean Plasma Glucose 88.19 mg/dL    Comment: Performed at [facility], [address] [place], [place] [address]  Lipid panel     Status: None   Collection Time: 04/13/20  2:29 PM  Result Value Ref Range   Cholesterol 122 0 - 200 mg/dL   Triglycerides  50 <409<150 mg/dL   HDL 48 >81>40 mg/dL   Total CHOL/HDL Ratio 2.5 RATIO   VLDL 10 0 - 40 mg/dL   LDL Cholesterol 64 0 - 99 mg/dL    Comment:        Total Cholesterol/HDL:CHD Risk Coronary Heart Disease Risk Table  Men   Women  1/2 Average Risk   3.4   3.3  Average Risk       5.0   4.4  2 X Average Risk   9.6   7.1  3 X Average Risk  23.4   11.0        Use the calculated Patient Ratio above and the CHD Risk Table to determine the patient's CHD Risk.        ATP III CLASSIFICATION (LDL):  <100     mg/dL   Optimal  322-025  mg/dL   Near or Above                    Optimal  130-159  mg/dL   Borderline  427-062  mg/dL   High  >376     mg/dL   Very High Performed at [facility], [address] [place], [place] [address]     Blood Alcohol level:  Lab Results  Component Value Date   [facility] <10 04/12/2020   ETH <10 03/03/2018    Metabolic Disorder Labs: Lab Results  Component Value Date   HGBA1C 4.7 (L) 04/13/2020   MPG 88.19 04/13/2020   MPG 93.93 03/03/2018   Lab Results  Component Value Date   PROLACTIN 17.5 05/02/2014   Lab Results  Component Value Date   CHOL 122 04/13/2020   TRIG 50 04/13/2020   HDL 48 04/13/2020   CHOLHDL 2.5 04/13/2020   VLDL 10 04/13/2020   LDLCALC 64 04/13/2020   LDLCALC 90 03/03/2018    Physical Findings: AIMS: Facial and Oral Movements Muscles of Facial Expression: None, normal Lips and Perioral Area: None, normal Jaw: None, normal Tongue: None, normal,Extremity Movements Upper (arms, wrists, hands, fingers): None, normal Lower (legs, knees, ankles, toes): None, normal, Trunk Movements Neck, shoulders, hips: None, normal, Overall Severity Severity of abnormal movements (highest score from questions above): None, normal Incapacitation due to abnormal movements: None, normal Patient's awareness of abnormal movements (rate only patient's report): No Awareness, Dental Status Current problems with teeth and/or  dentures?: No  CIWA:  CIWA-Ar Total: 0 COWS:  COWS Total Score: 2  Musculoskeletal: Strength & Muscle Tone: within normal limits Gait & Station: normal Patient leans: N/A  Psychiatric Specialty Exam: Physical Exam Vitals and nursing note reviewed.  Constitutional:      General: She is in acute distress.  HENT:     Head: Normocephalic and atraumatic.     Right Ear: External ear normal.     Left Ear: External ear normal.     Nose: Nose normal.     Mouth/Throat:     Mouth: Mucous membranes are moist.     Pharynx: Oropharynx is clear.  Eyes:     Extraocular Movements: Extraocular movements intact.     Conjunctiva/sclera: Conjunctivae normal.     Pupils: Pupils are equal, round, and reactive to light.  Cardiovascular:     Rate and Rhythm: Normal rate.     Pulses: Normal pulses.  Pulmonary:     Effort: Pulmonary effort is normal.     Breath sounds: Normal breath sounds.  Abdominal:     General: Abdomen is flat.     Palpations: Abdomen is soft.  Musculoskeletal:        General: No swelling. Normal range of motion.     Cervical back: Normal range of motion and neck supple.  Skin:    General: Skin is warm and dry.  Neurological:  General: No focal deficit present.     Mental Status: She is alert and oriented to person, place, and time.  Psychiatric:        Attention and Perception: She is inattentive. She perceives auditory hallucinations.        Mood and Affect: Mood is depressed. Affect is flat.        Speech: Speech is delayed.        Behavior: Behavior is withdrawn.        Thought Content: Thought content is paranoid and delusional. Thought content includes suicidal ideation.        Cognition and Memory: Cognition is impaired. Memory is impaired.        Judgment: Judgment is impulsive.     Review of Systems  Constitutional: Positive for fatigue. Negative for appetite change.  HENT: Negative for rhinorrhea and sore throat.   Eyes: Negative for photophobia and  visual disturbance.  Respiratory: Negative for cough and shortness of breath.   Cardiovascular: Negative for chest pain and palpitations.  Gastrointestinal: Negative for constipation, diarrhea, nausea and vomiting.  Endocrine: Negative for cold intolerance and heat intolerance.  Genitourinary: Negative for difficulty urinating and dysuria.  Musculoskeletal: Negative for arthralgias and gait problem.  Skin: Negative for rash and wound.  Allergic/Immunologic: Negative for food allergies and immunocompromised state.  Neurological: Negative for dizziness and headaches.  Hematological: Negative for adenopathy. Does not bruise/bleed easily.  Psychiatric/Behavioral: Positive for agitation, dysphoric mood, hallucinations and suicidal ideas. The patient is nervous/anxious.     Blood pressure 98/76, pulse (!) 114, temperature 97.8 F (36.6 C), temperature source Oral, resp. rate 18, height 5\' 11"  (1.803 m), weight 63.5 kg, last menstrual period 03/30/2020, SpO2 97 %.Body mass index is 19.53 kg/m.  General Appearance: Disheveled and obvious body odor  Eye Contact:  Absent  Speech:  Blocked and Slow  Volume:  Decreased  Mood:  Depressed  Affect:  Blunt  Thought Process:  Disorganized  Orientation:  Full (Time, Place, and Person)  Thought Content:  Delusions, Hallucinations: Auditory, Paranoid Ideation and Rumination  Suicidal Thoughts:  Yes.  without intent/plan  Homicidal Thoughts:  No  Memory:  Immediate;   Poor Recent;   Poor Remote;   Poor  Judgement:  Impaired  Insight:  Lacking  Psychomotor Activity:  Decreased  Concentration:  Concentration: Poor and Attention Span: Poor  Recall:  Poor  Fund of Knowledge:  Poor  Language:  Poor  Akathisia:  Negative  Handed:  Right  AIMS (if indicated):     Assets:  Desire for Improvement Housing Social Support  ADL's:  Impaired  Cognition:  Impaired,  Mild  Sleep:  Number of Hours: 8.15     Treatment Plan Summary: Daily contact with  patient to assess and evaluate symptoms and progress in treatment and Medication management PLAN OF CARE:19 year old woman with a historydepression and OCDnot currently in any treatment. Immediate behavior consistent with acute psychosis. Patient is living with her family,not working,not in school,low functioning. Differential diagnosis includesnew onset schizophrenia, psychotic depression, bipolar disorder with psychosis. Continue IVC. Increase Seroquel to 50 mg in the morning, 200 mg in the evening. Geodon PRN for acute agitation.   24, MD 04/14/2020, 9:51 AM

## 2020-04-14 NOTE — Plan of Care (Signed)
  Problem: Coping: Goal: Coping ability will improve Outcome: Progressing Goal: Will verbalize feelings Outcome: Progressing   

## 2020-04-15 DIAGNOSIS — F23 Brief psychotic disorder: Secondary | ICD-10-CM | POA: Diagnosis not present

## 2020-04-15 MED ORDER — QUETIAPINE FUMARATE ER 300 MG PO TB24
300.0000 mg | ORAL_TABLET | Freq: Every day | ORAL | Status: DC
  Administered 2020-04-15: 300 mg via ORAL
  Filled 2020-04-15 (×2): qty 1

## 2020-04-15 MED ORDER — HALOPERIDOL LACTATE 5 MG/ML IJ SOLN: 5.0000 mg | Freq: Three times a day (TID) | INTRAMUSCULAR | Status: DC | PRN

## 2020-04-15 MED ORDER — LORAZEPAM 2 MG/ML IJ SOLN: 2.0000 mg | Freq: Three times a day (TID) | INTRAMUSCULAR | Status: DC | PRN

## 2020-04-15 MED ORDER — HALOPERIDOL 5 MG PO TABS
5.0000 mg | ORAL_TABLET | Freq: Three times a day (TID) | ORAL | Status: DC | PRN
  Administered 2020-04-16 – 2020-04-29 (×6): 5 mg via ORAL
  Filled 2020-04-15 (×7): qty 1

## 2020-04-15 MED ORDER — DIPHENHYDRAMINE HCL 50 MG/ML IJ SOLN: 50.0000 mg | Freq: Three times a day (TID) | INTRAMUSCULAR | Status: DC | PRN

## 2020-04-15 MED ORDER — LORAZEPAM 2 MG PO TABS
2.0000 mg | ORAL_TABLET | Freq: Three times a day (TID) | ORAL | Status: DC | PRN
  Administered 2020-04-16 – 2020-04-27 (×6): 2 mg via ORAL
  Filled 2020-04-15: qty 1

## 2020-04-15 MED ORDER — DIPHENHYDRAMINE HCL 25 MG PO CAPS
50.0000 mg | ORAL_CAPSULE | Freq: Three times a day (TID) | ORAL | Status: DC | PRN
  Administered 2020-04-16 – 2020-04-29 (×7): 50 mg via ORAL
  Filled 2020-04-15 (×8): qty 2

## 2020-04-15 NOTE — Progress Notes (Signed)
[facility] MD Progress Note  04/15/2020 11:45 AM Robin Hess  MRN:  [id number]   Subjective:  19 year old woman brought into the hospital under IVC Patient was brought in by EMS after running away from family running out through the woods with bizarre talking behavior. Reported by EMS that the patient was muttering to herself about being guilty of something or of having killed someone.  Yesterday evening, patient began pacing the hallways, picking her nose until bloody, and lying on floor. She also became acutely agitated requiring PRN Geodon. Overnight she required frequent redirection as well and was very anxious requiring PRN Ativan. This morning, patient medication complaint, and ate majority of breakfast.   She is assessed one-on-one at bedside this morning. She is awake, and sitting up in bed. She continues to respond to internal stimuli at time of interview. Her speech is more free flowing today, but thought process is disorganized, and content bizarre. 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 However, in doing so, she became a spy for the [place] and launched a Programme researcher, broadcasting/film/video. She says the website now has a federal flag on it. She says she never meant to be a spy, and she would never, ever try and help a human trafficking ring. She then abruptly tells me she thinks she killed her friend. She becomes very anxious and tearful and keeps stating, "I don't know what is real and what is fake." Reassurance provided to patient. Discussed diagnosis of brief psychosis, medication, and proposed treatment. She is grateful for medications, and feels that her thoughts are somewhat clearer today.    Principal Problem: Psychosis (HCC) Diagnosis: Principal Problem:   Psychosis (HCC) Active Problems:   Social anxiety disorder   OCD (obsessive compulsive disorder)  Total Time spent with patient: 30 minutes  Past Psychiatric History: Patient has had 2  prior hospitalizations in 2015 and 2019. Last diagnosis was depression and OCD. Did not follow-up with outpatient treatment. Not currently getting any outpatient treatment. Has made suicidal statements in the past. Recently had not been trying to hurt her self or showing any violence to others. Some history of cannabis use in the past but currently does not seem to be an active issue.  Past Medical History:  Past Medical History:  Diagnosis Date  . Allergy   . Anxiety   . Cannabis use disorder, moderate, dependence (HCC)   . Depression   . OCD (obsessive compulsive disorder)   . Severe major depression, single episode, with psychotic features (HCC)   . Social anxiety disorder    History reviewed. No pertinent surgical history. Family History: History reviewed. No pertinent family history. Family Psychiatric  History: None reported Social History:  Social History   Substance and Sexual Activity  Alcohol Use No     Social History   Substance and Sexual Activity  Drug Use Yes  . Types: Marijuana    Social History   Socioeconomic History  . Marital status: Single    Spouse name: Not on file  . Number of children: Not on file  . Years of education: Not on file  . Highest education level: Not on file  Occupational History  . Not on file  Tobacco Use  . Smoking status: Never Smoker  . Smokeless tobacco: Never Used  Substance and Sexual Activity  . Alcohol use: No  . Drug use: Yes    Types: Marijuana  . Sexual activity: Never  Birth control/protection: None  Other Topics Concern  . Not on file  Social History Narrative  . Not on file   Social Determinants of Health   Financial Resource Strain:   . Difficulty of Paying Living Expenses: Not on file  Food Insecurity:   . Worried About Programme researcher, broadcasting/film/videounning Out of Food in the Last Year: Not on file  . Ran Out of Food in the Last Year: Not on file  Transportation Needs:   . Lack of Transportation (Medical): Not on file  .  Lack of Transportation (Non-Medical): Not on file  Physical Activity:   . Days of Exercise per Week: Not on file  . Minutes of Exercise per Session: Not on file  Stress:   . Feeling of Stress : Not on file  Social Connections:   . Frequency of Communication with Friends and Family: Not on file  . Frequency of Social Gatherings with Friends and Family: Not on file  . Attends Religious Services: Not on file  . Active Member of Clubs or Organizations: Not on file  . Attends BankerClub or Organization Meetings: Not on file  . Marital Status: Not on file   Additional Social History:                         Sleep: Fair  Appetite:  Fair  Current Medications: Current Facility-Administered Medications  Medication Dose Route Frequency Provider Last Rate Last Admin  . acetaminophen (TYLENOL) tablet 650 mg  650 mg Oral Q6H PRN Clapacs, Jackquline DenmarkJohn T, MD   650 mg at 04/14/20 1217  . alum & mag hydroxide-simeth (MAALOX/MYLANTA) 200-200-20 MG/5ML suspension 30 mL  30 mL Oral Q4H PRN Clapacs, [name], MD      . haloperidol (HALDOL) tablet 5 mg  5 mg Oral Q8H PRN Jesse SansFreeman, Anuj Summons M, MD       And  . LORazepam (ATIVAN) tablet 2 mg  2 mg Oral Q8H PRN Jesse SansFreeman, Jakiera Ehler M, MD       And  . diphenhydrAMINE (BENADRYL) capsule 50 mg  50 mg Oral Q8H PRN Jesse SansFreeman, Renu Asby M, MD      . haloperidol lactate (HALDOL) injection 5 mg  5 mg Intramuscular Q8H PRN Jesse SansFreeman, Briele Lagasse M, MD       And  . LORazepam (ATIVAN) injection 2 mg  2 mg Intramuscular Q8H PRN Jesse SansFreeman, Zachariah Pavek M, MD       And  . diphenhydrAMINE (BENADRYL) injection 50 mg  50 mg Intravenous Q8H PRN Jesse SansFreeman, Kristof Nadeem M, MD      . feeding supplement (ENSURE ENLIVE / ENSURE PLUS) liquid 237 mL  237 mL Oral BID BM Jesse SansFreeman, Branden Shallenberger M, MD   237 mL at 04/14/20 1706  . hydrOXYzine (ATARAX/VISTARIL) tablet 50 mg  50 mg Oral TID PRN Clapacs, Jackquline DenmarkJohn T, MD   50 mg at 04/14/20 2016  . LORazepam (ATIVAN) tablet 2 mg  2 mg Oral Q6H PRN Jesse SansFreeman, Jahden Schara M, MD   2 mg at 04/15/20 [id number]   Or   . LORazepam (ATIVAN) injection 2 mg  2 mg Intramuscular Q6H PRN Jesse SansFreeman, Daquarius Dubeau M, MD      . magnesium hydroxide (MILK OF MAGNESIA) suspension 30 mL  30 mL Oral Daily PRN Clapacs, [name], MD      . QUEtiapine (SEROQUEL) tablet 200 mg  200 mg Oral QHS Jesse SansFreeman, Preslynn Bier M, MD   200 mg at 04/14/20 2015  . QUEtiapine (SEROQUEL) tablet 50 mg  50 mg Oral Daily  [name], MD   50 mg at 04/15/20 0744  . traZODone (DESYREL) tablet 100 mg  100 mg Oral QHS PRN Clapacs, [name], MD   100 mg at 04/14/20 2016    Lab Results:  Results for orders placed or performed during the hospital encounter of 04/13/20 (from the past 48 hour(s))  Hemoglobin A1c     Status: Abnormal   Collection Time: 04/13/20  2:29 PM  Result Value Ref Range   Hgb A1c MFr Bld 4.7 (L) 4.8 - 5.6 %    Comment: (NOTE) Pre diabetes:          5.7%-6.4%  Diabetes:              >6.4%  Glycemic control for   <7.0% adults with diabetes    Mean Plasma Glucose 88.19 mg/dL    Comment: Performed at [facility], [address] [place], [place] [address]  Lipid panel     Status: None   Collection Time: 04/13/20  2:29 PM  Result Value Ref Range   Cholesterol 122 0 - 200 mg/dL   Triglycerides 50 <226 mg/dL   HDL 48 >33 mg/dL   Total CHOL/HDL Ratio 2.5 RATIO   VLDL 10 0 - 40 mg/dL   LDL Cholesterol 64 0 - 99 mg/dL    Comment:        Total Cholesterol/HDL:CHD Risk Coronary Heart Disease Risk Table                     Men   Women  1/2 Average Risk   3.4   3.3  Average Risk       5.0   4.4  2 X Average Risk   9.6   7.1  3 X Average Risk  23.4   11.0        Use the calculated Patient Ratio above and the CHD Risk Table to determine the patient's CHD Risk.        ATP III CLASSIFICATION (LDL):  <100     mg/dL   Optimal  354-562  mg/dL   Near or Above                    Optimal  130-159  mg/dL   Borderline  563-893  mg/dL   High  >734     mg/dL   Very High Performed at [facility], [address]  [place], [place] [address]     Blood Alcohol level:  Lab Results  Component Value Date   [facility] <10 04/12/2020   ETH <10 03/03/2018    Metabolic Disorder Labs: Lab Results  Component Value Date   HGBA1C 4.7 (L) 04/13/2020   MPG 88.19 04/13/2020   MPG 93.93 03/03/2018   Lab Results  Component Value Date   PROLACTIN 17.5 05/02/2014   Lab Results  Component Value Date   CHOL 122 04/13/2020   TRIG 50 04/13/2020   HDL 48 04/13/2020   CHOLHDL 2.5 04/13/2020   VLDL 10 04/13/2020   LDLCALC 64 04/13/2020   LDLCALC 90 03/03/2018    Physical Findings: AIMS: Facial and Oral Movements Muscles of Facial Expression: None, normal Lips and Perioral Area: None, normal Jaw: None, normal Tongue: None, normal,Extremity Movements Upper (arms, wrists, hands, fingers): None, normal Lower (legs, knees, ankles, toes): None, normal, Trunk Movements Neck, shoulders, hips: None, normal, Overall Severity Severity of abnormal movements (highest score from questions above): None, normal Incapacitation due to abnormal  movements: None, normal Patient's awareness of abnormal movements (rate only patient's report): No Awareness, Dental Status Current problems with teeth and/or dentures?: No  CIWA:  CIWA-Ar Total: 0 COWS:  COWS Total Score: 2  Musculoskeletal: Strength & Muscle Tone: within normal limits Gait & Station: normal Patient leans: N/A  Psychiatric Specialty Exam: Physical Exam Vitals and nursing note reviewed.  Constitutional:      General: She is in acute distress.  HENT:     Head: Normocephalic and atraumatic.     Right Ear: External ear normal.     Left Ear: External ear normal.     Nose: Nose normal.     Mouth/Throat:     Mouth: Mucous membranes are moist.     Pharynx: Oropharynx is clear.  Eyes:     Extraocular Movements: Extraocular movements intact.     Conjunctiva/sclera: Conjunctivae normal.     Pupils: Pupils are equal, round, and reactive to light.  Cardiovascular:      Rate and Rhythm: Normal rate.     Pulses: Normal pulses.  Pulmonary:     Effort: Pulmonary effort is normal.     Breath sounds: Normal breath sounds.  Abdominal:     General: Abdomen is flat.     Palpations: Abdomen is soft.  Musculoskeletal:        General: No swelling. Normal range of motion.     Cervical back: Normal range of motion and neck supple.  Skin:    General: Skin is warm and dry.  Neurological:     General: No focal deficit present.     Mental Status: She is alert and oriented to person, place, and time.  Psychiatric:        Attention and Perception: She is inattentive. She perceives auditory hallucinations.        Mood and Affect: Mood is depressed. Affect is flat.        Speech: Speech is delayed.        Behavior: Behavior is withdrawn.        Thought Content: Thought content is paranoid and delusional. Thought content includes suicidal ideation.        Cognition and Memory: Cognition is impaired. Memory is impaired.        Judgment: Judgment is impulsive.     Review of Systems  Constitutional: Positive for fatigue. Negative for appetite change.  HENT: Negative for rhinorrhea and sore throat.   Eyes: Negative for photophobia and visual disturbance.  Respiratory: Negative for cough and shortness of breath.   Cardiovascular: Negative for chest pain and palpitations.  Gastrointestinal: Negative for constipation, diarrhea, nausea and vomiting.  Endocrine: Negative for cold intolerance and heat intolerance.  Genitourinary: Negative for difficulty urinating and dysuria.  Musculoskeletal: Negative for arthralgias and gait problem.  Skin: Negative for rash and wound.  Allergic/Immunologic: Negative for food allergies and immunocompromised state.  Neurological: Negative for dizziness and headaches.  Hematological: Negative for adenopathy. Does not bruise/bleed easily.  Psychiatric/Behavioral: Positive for agitation, dysphoric mood, hallucinations and suicidal ideas. The  patient is nervous/anxious.     Blood pressure 101/69, pulse (!) 102, temperature 98.7 F (37.1 C), temperature source Oral, resp. rate 18, height 5\' 11"  (1.803 m), weight 63.5 kg, last menstrual period 03/30/2020, SpO2 100 %.Body mass index is 19.53 kg/m.  General Appearance: Disheveled and obvious body odor  Eye Contact:  Absent  Speech:  Blocked and Slow  Volume:  Decreased  Mood:  Depressed  Affect:  Blunt  Thought Process:  Disorganized  Orientation:  Full (Time, Place, and Person)  Thought Content:  Delusions, Hallucinations: Auditory, Paranoid Ideation and Rumination  Suicidal Thoughts:  Yes.  without intent/plan  Homicidal Thoughts:  No  Memory:  Immediate;   Poor Recent;   Poor Remote;   Poor  Judgement:  Impaired  Insight:  Lacking  Psychomotor Activity:  Decreased  Concentration:  Concentration: Poor and Attention Span: Poor  Recall:  Poor  Fund of Knowledge:  Poor  Language:  Poor  Akathisia:  Negative  Handed:  Right  AIMS (if indicated):     Assets:  Desire for Improvement Housing Social Support  ADL's:  Impaired  Cognition:  Impaired,  Mild  Sleep:  Number of Hours: 7.15     Treatment Plan Summary: Daily contact with patient to assess and evaluate symptoms and progress in treatment and Medication management PLAN OF CARE:19 year old woman with a historydepression and OCDnot currently in any treatment. Immediate behavior consistent with acute psychosis. Patient is living with her family,not working,not in school,low functioning. Differential diagnosis includesnew onset schizophrenia, psychotic depression, bipolar disorder with psychosis. Continue IVC. Increase Seroquel to 50 mg in the morning, 300 mg in the evening. Haldol/Ativan/Bendaryl PRN for acute agitation.   [name], MD 04/15/2020, 11:45 AM

## 2020-04-15 NOTE — Progress Notes (Signed)
Patient remains disorganized and requires frequent redirection to stay on task. During the evening patient was calling 911, at shift change she was found in the sally port following behind staff to leave the unit. She was given PRN Ativan with some effect. She needed frequent redirection to stay on task.

## 2020-04-15 NOTE — Progress Notes (Signed)
Pt rested in her room and called her mother. She said that after speaking with her mother she feels better and realizes that she was not thinking clearly.  Torrie Mayers RN

## 2020-04-15 NOTE — [facility] Suicide Risk Assessment (Signed)
[facility] INPATIENT:  Family/Significant Other Suicide Prevention Education  Suicide Prevention Education:  Education Completed; Robin Hess, [phone] has been identified by the patient as the family member/significant other with whom the patient will be residing, and identified as the person(s) who will aid the patient in the event of a mental health crisis (suicidal ideations/suicide attempt).  With written consent from the patient, the family member/significant other has been provided the following suicide prevention education, prior to the and/or following the discharge of the patient.  The suicide prevention education provided includes the following:  Suicide risk factors  Suicide prevention and interventions  National Suicide Hotline telephone number  [facility] assessment telephone number  [facility] Emergency Assistance 911  [facility] and/or Residential Mobile Crisis Unit telephone number  Request made of family/significant other to:  Remove weapons (e.g., guns, rifles, knives), all items previously/currently identified as safety concern.    Remove drugs/medications (over-the-counter, prescriptions, illicit drugs), all items previously/currently identified as a safety concern.  The family member/significant other verbalizes understanding of the suicide prevention education information provided.  The family member/significant other agrees to remove the items of safety concern listed above.    Robin Hess  Robin Hess 04/15/2020, 10:08 AM

## 2020-04-15 NOTE — Progress Notes (Signed)
Pt is having a crying spell. 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 I consoled her and gave her emotional support. I also gave her a PRN.  Torrie Mayers RN

## 2020-04-15 NOTE — Progress Notes (Signed)
D- Patient alert and oriented. Affect/mood is childlike, slow.sad, cooperative and blunted.  Pt denies SI, HI, AVH, and pain.   A- Scheduled medications administered to patient, per MD orders. Support and encouragement provided.  Routine safety checks conducted every 15 minutes.  Patient informed to notify staff with problems or concerns.  R- No adverse drug reactions noted. Patient contracts for safety at this time. Patient compliant with medications and treatment plan. Patient receptive, calm, and cooperative. Patient interacts well with others on the unit.  Patient remains safe at this time.  Torrie Mayers RN

## 2020-04-15 NOTE — [facility] Group Notes (Signed)
LCSW Group Therapy Note      04/15/2020:  1:00 PM- 1:54 PM                 Type of Therapy and Topic:  Group Therapy: Establishing Boundaries    Participation Level:  None     Description of Group:    In this group, patients learned how to define boundaries, discussed the different types or boundaries with examples.  They identified times that boundaries had been violated and how they reacted.  They analyzed how their reaction was possibly beneficial and how it was possibly unhelpful.  The group discussed how to set boundaries, respect others boundaries and communicate their boundaries. The group utilized a role play scenarios (working with a partner) and discussed how each person in the scenario could have reacted differently and what boundaries they need to implement to improve their life. Patients also discussed consequences to overstepping boundaries and lack of boundaries. Patients discussed how to establish boundaries with clear consequences. Patients will explore discussion questions that address media influence and why it is hard to set boundaries.       Therapeutic Goals:   Patients will define boundaries and explore (physical, personal space and language boundaries).  Patients will remember their last incident where their boundaries were violated and how they behaved.  Patients will practice empathy and understanding of other's boundaries and learn from others in group.  Patients will explore how they may have crossed another person's boundaries in the past.   Patients will learn healthy ways to set and communicate boundaries.  Patients will actively engage in group activity utilizing role play and critical thinking skills.      Summary of Patient Progress:  Patient sat in group but did not participate      Therapeutic Modalities:    Cognitive Behavioral Therapy      Susa Simmonds, LCSWA   04/15/2020

## 2020-04-15 NOTE — Plan of Care (Signed)
Pt rates depression and anxiety 10/10. Pt rates hopelessness 7/10. Pt denies HI and VH. Pt has AH and is SI with not plan. Pt contracts for safety. Torrie Mayers RN Problem: Consults Goal: Concurrent Medical Patient Education Description: (See Patient Education Module for education specifics) Outcome: Not Progressing   Problem: [facility] Concurrent Medical Problem Goal: LTG-Pt will be physically stable and he/significant other Description: (Patient will be physically stable and he/significant other will be able to verbalize understanding of follow-up care and symptoms that would warrant further treatment) Outcome: Not Progressing Goal: STG-Vital signs will be within defined limits or stabilized Description: (STG- Vital signs will be within defined limits or stabilized for individual) Outcome: Not Progressing Goal: STG-Compliance with medication and/or treatment as ordered Description: (STG-Compliance with medication and/or treatment as ordered by MD) Outcome: Not Progressing Goal: STG-Verbalize two symptoms that would warrant further Description: (STG-Verbalize two symptoms that would warrant further treatment) Outcome: Not Progressing Goal: STG-Patient will participate in management/stabilization Description: (STG-Patient will participate in management/stabilization of medical condition) Outcome: Not Progressing Goal: STG-Other (Specify): Description: STG-Other Concurrent Medical (Specify): Outcome: Not Progressing   Problem: Education: Goal: Utilization of techniques to improve thought processes will improve Outcome: Not Progressing Goal: Knowledge of the prescribed therapeutic regimen will improve Outcome: Not Progressing   Problem: Activity: Goal: Interest or engagement in leisure activities will improve Outcome: Not Progressing Goal: Imbalance in normal sleep/wake cycle will improve Outcome: Not Progressing   Problem: Coping: Goal: Coping ability will improve Outcome: Not  Progressing Goal: Will verbalize feelings Outcome: Not Progressing   Problem: Health Behavior/Discharge Planning: Goal: Ability to make decisions will improve Outcome: Not Progressing Goal: Compliance with therapeutic regimen will improve Outcome: Not Progressing   Problem: Role Relationship: Goal: Will demonstrate positive changes in social behaviors and relationships Outcome: Not Progressing   Problem: Safety: Goal: Ability to disclose and discuss suicidal ideas will improve Outcome: Not Progressing Goal: Ability to identify and utilize support systems that promote safety will improve Outcome: Not Progressing   Problem: Self-Concept: Goal: Will verbalize positive feelings about self Outcome: Not Progressing Goal: Level of anxiety will decrease Outcome: Not Progressing

## 2020-04-16 DIAGNOSIS — F23 Brief psychotic disorder: Secondary | ICD-10-CM | POA: Diagnosis not present

## 2020-04-16 MED ORDER — QUETIAPINE FUMARATE ER 300 MG PO TB24
300.0000 mg | ORAL_TABLET | Freq: Every day | ORAL | Status: DC
  Administered 2020-04-16: 300 mg via ORAL
  Filled 2020-04-16 (×2): qty 1

## 2020-04-16 MED ORDER — CHLORPROMAZINE HCL 25 MG/ML IJ SOLN
25.0000 mg | Freq: Three times a day (TID) | INTRAMUSCULAR | Status: DC | PRN
  Filled 2020-04-16: qty 1

## 2020-04-16 NOTE — Plan of Care (Addendum)
Patient remains disorganized and need frequent redirection.Patient stayed in bed till this afternoon.Patient maintained personal hygiene by following step by step direction from staff.Attended group for some time.When staff asked about the group patient states " he said I should be responsible for my actions." Patient  states " At the age 19  I made a fake ID and  did bad stuffs. My dad got mad." Patient is emotional and tearful.Compliant with medications.Patient is trying to run out of the unit and put her on elopement risk.Support and encouragement given.

## 2020-04-16 NOTE — Plan of Care (Signed)
  Problem: Consults Goal: Concurrent Medical Patient Education Description: (See Patient Education Module for education specifics) Outcome: Not Progressing   Problem: [facility] Concurrent Medical Problem Goal: LTG-Pt will be physically stable and he/significant other Description: (Patient will be physically stable and he/significant other will be able to verbalize understanding of follow-up care and symptoms that would warrant further treatment) Outcome: Not Progressing Goal: STG-Vital signs will be within defined limits or stabilized Description: (STG- Vital signs will be within defined limits or stabilized for individual) Outcome: Not Progressing Goal: STG-Compliance with medication and/or treatment as ordered Description: (STG-Compliance with medication and/or treatment as ordered by MD) Outcome: Not Progressing Goal: STG-Verbalize two symptoms that would warrant further Description: (STG-Verbalize two symptoms that would warrant further treatment) Outcome: Not Progressing Goal: STG-Patient will participate in management/stabilization Description: (STG-Patient will participate in management/stabilization of medical condition) Outcome: Not Progressing Goal: STG-Other (Specify): Description: STG-Other Concurrent Medical (Specify): Outcome: Not Progressing   Problem: Education: Goal: Utilization of techniques to improve thought processes will improve Outcome: Not Progressing Goal: Knowledge of the prescribed therapeutic regimen will improve Outcome: Not Progressing   Problem: Activity: Goal: Interest or engagement in leisure activities will improve Outcome: Not Progressing Goal: Imbalance in normal sleep/wake cycle will improve Outcome: Not Progressing   Problem: Coping: Goal: Coping ability will improve Outcome: Not Progressing Goal: Will verbalize feelings Outcome: Not Progressing   Problem: Health Behavior/Discharge Planning: Goal: Ability to make decisions will  improve Outcome: Not Progressing Goal: Compliance with therapeutic regimen will improve Outcome: Not Progressing   Problem: Role Relationship: Goal: Will demonstrate positive changes in social behaviors and relationships Outcome: Not Progressing   Problem: Safety: Goal: Ability to disclose and discuss suicidal ideas will improve Outcome: Not Progressing Goal: Ability to identify and utilize support systems that promote safety will improve Outcome: Not Progressing   Problem: Self-Concept: Goal: Will verbalize positive feelings about self Outcome: Not Progressing Goal: Level of anxiety will decrease Outcome: Not Progressing   

## 2020-04-16 NOTE — Progress Notes (Signed)
Patient awoke this morning  Exhibiting signs of confusion and disorientation.  May need sitter if she continues to display these symptoms/behaviors.   Cleo Butler-Nicholson, LPN
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
## 2020-04-16 NOTE — Progress Notes (Signed)
Recreation Therapy Notes   Date: 04/16/2020  Time: 9:30 am   Location: Craft room     Behavioral response: N/A   Intervention Topic: Self-esteem   Discussion/Intervention: Patient did not attend group.   Clinical Observations/Feedback:  Patient did not attend group.   Kylian Loh LRT/CTRS        Akeiba Axelson 04/16/2020 12:09 PM 

## 2020-04-16 NOTE — [facility] Group Notes (Signed)
LCSW Group Therapy Note   04/16/2020 2:46 PM  Type of Therapy and Topic:  Group Therapy:  Overcoming Obstacles   Participation Level:  None   Description of Group:    In this group patients will be encouraged to explore what they see as obstacles to their own wellness and recovery. They will be guided to discuss their thoughts, feelings, and behaviors related to these obstacles. The group will process together ways to cope with barriers, with attention given to specific choices patients can make. Each patient will be challenged to identify changes they are motivated to make in order to overcome their obstacles. This group will be process-oriented, with patients participating in exploration of their own experiences as well as giving and receiving support and challenge from other group members.   Therapeutic Goals: 1. Patient will identify personal and current obstacles as they relate to admission. 2. Patient will identify barriers that currently interfere with their wellness or overcoming obstacles.  3. Patient will identify feelings, thought process and behaviors related to these barriers. 4. Patient will identify two changes they are willing to make to overcome these obstacles:      Summary of Patient Progress Pt came in halfway through the group. She sat with her eyes closed without speaking before walking back out of the room. Pt told nurse that she learned that she needs to take responsibility for her actions from the group.     Therapeutic Modalities:   Cognitive Behavioral Therapy Solution Focused Therapy Motivational Interviewing Relapse Prevention Therapy  Robin Hess R. Algis Greenhouse, MSW, LCSW, LCAS 04/16/2020 2:46 PM

## 2020-04-16 NOTE — Progress Notes (Signed)
[facility] MD Progress Note  04/16/2020 11:15 AM Robin Hess  MRN:  [id number]   Subjective:  19 year old woman brought into the hospital under IVC Patient was brought in by EMS after running away from family running out through the woods with bizarre talking behavior. Reported by EMS that the patient was muttering to herself about being guilty of something or of having killed someone.  Overnight she required frequent redirection as well and was very anxious requiring PRN Ativan and hydroxyzine for anxiety. This morning, patient medication complaint, and ate majority of breakfast.   She is assessed one-on-one at bedside this morning. She appears tired, and is mumbling this morning. However, when I stated I would come back later to speak, she stood up and began speaking in a normal volume. She is able to say her name, that she is in the hospital, and she is here for confusing thoughts. However, she remains disorganized in thought process, and difficult to follow. She again brings up the internet and accidentally starting a human trafficking ring. She also mentions killing her friend again. She notes that she called her mom who told her she was just confused. Discussed diagnosis of brief psychosis, medication, and proposed treatment.    Principal Problem: Psychosis (HCC) Diagnosis: Principal Problem:   Psychosis (HCC) Active Problems:   Social anxiety disorder   OCD (obsessive compulsive disorder)  Total Time spent with patient: 30 minutes  Past Psychiatric History: Patient has had 2 prior hospitalizations in 2015 and 2019. Last diagnosis was depression and OCD. Did not follow-up with outpatient treatment. Not currently getting any outpatient treatment. Has made suicidal statements in the past. Recently had not been trying to hurt her self or showing any violence to others. Some history of cannabis use in the past but currently does not seem to be an active issue.  Past Medical History:   Past Medical History:  Diagnosis Date  . Allergy   . Anxiety   . Cannabis use disorder, moderate, dependence (HCC)   . Depression   . OCD (obsessive compulsive disorder)   . Severe major depression, single episode, with psychotic features (HCC)   . Social anxiety disorder    History reviewed. No pertinent surgical history. Family History: History reviewed. No pertinent family history. Family Psychiatric  History: None reported Social History:  Social History   Substance and Sexual Activity  Alcohol Use No     Social History   Substance and Sexual Activity  Drug Use Yes  . Types: Marijuana    Social History   Socioeconomic History  . Marital status: Single    Spouse name: Not on file  . Number of children: Not on file  . Years of education: Not on file  . Highest education level: Not on file  Occupational History  . Not on file  Tobacco Use  . Smoking status: Never Smoker  . Smokeless tobacco: Never Used  Substance and Sexual Activity  . Alcohol use: No  . Drug use: Yes    Types: Marijuana  . Sexual activity: Never    Birth control/protection: None  Other Topics Concern  . Not on file  Social History Narrative  . Not on file   Social Determinants of Health   Financial Resource Strain:   . Difficulty of Paying Living Expenses: Not on file  Food Insecurity:   . Worried About Programme researcher, broadcasting/film/video in the Last Year: Not on file  . Ran Out of Food in the Last Year: Not  on file  Transportation Needs:   . Lack of Transportation (Medical): Not on file  . Lack of Transportation (Non-Medical): Not on file  Physical Activity:   . Days of Exercise per Week: Not on file  . Minutes of Exercise per Session: Not on file  Stress:   . Feeling of Stress : Not on file  Social Connections:   . Frequency of Communication with Friends and Family: Not on file  . Frequency of Social Gatherings with Friends and Family: Not on file  . Attends Religious Services: Not on file  .  Active Member of Clubs or Organizations: Not on file  . Attends BankerClub or Organization Meetings: Not on file  . Marital Status: Not on file   Additional Social History:                         Sleep: Fair  Appetite:  Fair  Current Medications: Current Facility-Administered Medications  Medication Dose Route Frequency Provider Last Rate Last Admin  . acetaminophen (TYLENOL) tablet 650 mg  650 mg Oral Q6H PRN Clapacs, Jackquline DenmarkJohn T, MD   650 mg at 04/14/20 1217  . alum & mag hydroxide-simeth (MAALOX/MYLANTA) 200-200-20 MG/5ML suspension 30 mL  30 mL Oral Q4H PRN Clapacs, [name], MD      . haloperidol (HALDOL) tablet 5 mg  5 mg Oral Q8H PRN Jesse SansFreeman, Madell Heino M, MD       And  . LORazepam (ATIVAN) tablet 2 mg  2 mg Oral Q8H PRN Jesse SansFreeman, Shekira Drummer M, MD       And  . diphenhydrAMINE (BENADRYL) capsule 50 mg  50 mg Oral Q8H PRN Jesse SansFreeman, Chloee Tena M, MD      . haloperidol lactate (HALDOL) injection 5 mg  5 mg Intramuscular Q8H PRN Jesse SansFreeman, Darely Becknell M, MD       And  . LORazepam (ATIVAN) injection 2 mg  2 mg Intramuscular Q8H PRN Jesse SansFreeman, Mahaila Tischer M, MD       And  . diphenhydrAMINE (BENADRYL) injection 50 mg  50 mg Intravenous Q8H PRN Jesse SansFreeman, Rianne Degraaf M, MD      . feeding supplement (ENSURE ENLIVE / ENSURE PLUS) liquid 237 mL  237 mL Oral BID BM Jesse SansFreeman, Dashiel Bergquist M, MD   237 mL at 04/15/20 1743  . hydrOXYzine (ATARAX/VISTARIL) tablet 50 mg  50 mg Oral TID PRN Clapacs, Jackquline DenmarkJohn T, MD   50 mg at 04/15/20 2239  . LORazepam (ATIVAN) tablet 2 mg  2 mg Oral Q6H PRN Jesse SansFreeman, Bethenny Losee M, MD   2 mg at 04/15/20 1659   Or  . LORazepam (ATIVAN) injection 2 mg  2 mg Intramuscular Q6H PRN Jesse SansFreeman, Subrina Vecchiarelli M, MD      . magnesium hydroxide (MILK OF MAGNESIA) suspension 30 mL  30 mL Oral Daily PRN Clapacs, [name], MD      . QUEtiapine (SEROQUEL XR) 24 hr tablet 300 mg  300 mg Oral QHS Jesse SansFreeman, Lyrik Dockstader M, MD   300 mg at 04/15/20 2240  . QUEtiapine (SEROQUEL) tablet 200 mg  200 mg Oral QHS Jesse SansFreeman, Daneesha Quinteros M, MD   200 mg at 04/15/20 2239  .  QUEtiapine (SEROQUEL) tablet 50 mg  50 mg Oral Daily Jesse SansFreeman, Yailine Ballard M, MD   50 mg at 04/16/20 0752  . traZODone (DESYREL) tablet 100 mg  100 mg Oral QHS PRN Clapacs, Jackquline DenmarkJohn T, MD   100 mg at 04/15/20 2239    Lab Results:  No results found for this or  any previous visit (from the past 48 hour(s)).  Blood Alcohol level:  Lab Results  Component Value Date   ETH <10 04/12/2020   ETH <10 03/03/2018    Metabolic Disorder Labs: Lab Results  Component Value Date   HGBA1C 4.7 (L) 04/13/2020   MPG 88.19 04/13/2020   MPG 93.93 03/03/2018   Lab Results  Component Value Date   PROLACTIN 17.5 05/02/2014   Lab Results  Component Value Date   CHOL 122 04/13/2020   TRIG 50 04/13/2020   HDL 48 04/13/2020   CHOLHDL 2.5 04/13/2020   VLDL 10 04/13/2020   LDLCALC 64 04/13/2020   LDLCALC 90 03/03/2018    Physical Findings: AIMS: Facial and Oral Movements Muscles of Facial Expression: None, normal Lips and Perioral Area: None, normal Jaw: None, normal Tongue: None, normal,Extremity Movements Upper (arms, wrists, hands, fingers): None, normal Lower (legs, knees, ankles, toes): None, normal, Trunk Movements Neck, shoulders, hips: None, normal, Overall Severity Severity of abnormal movements (highest score from questions above): None, normal Incapacitation due to abnormal movements: None, normal Patient's awareness of abnormal movements (rate only patient's report): No Awareness, Dental Status Current problems with teeth and/or dentures?: No  CIWA:  CIWA-Ar Total: 0 COWS:  COWS Total Score: 2  Musculoskeletal: Strength & Muscle Tone: within normal limits Gait & Station: normal Patient leans: N/A  Psychiatric Specialty Exam: Physical Exam Vitals and nursing note reviewed.  Constitutional:      General: She is in acute distress.  HENT:     Head: Normocephalic and atraumatic.     Right Ear: External ear normal.     Left Ear: External ear normal.     Nose: Nose normal.      Mouth/Throat:     Mouth: Mucous membranes are moist.     Pharynx: Oropharynx is clear.  Eyes:     Extraocular Movements: Extraocular movements intact.     Conjunctiva/sclera: Conjunctivae normal.     Pupils: Pupils are equal, round, and reactive to light.  Cardiovascular:     Rate and Rhythm: Normal rate.     Pulses: Normal pulses.  Pulmonary:     Effort: Pulmonary effort is normal.     Breath sounds: Normal breath sounds.  Abdominal:     General: Abdomen is flat.     Palpations: Abdomen is soft.  Musculoskeletal:        General: No swelling. Normal range of motion.     Cervical back: Normal range of motion and neck supple.  Skin:    General: Skin is warm and dry.  Neurological:     General: No focal deficit present.     Mental Status: She is alert and oriented to person, place, and time.  Psychiatric:        Attention and Perception: She is inattentive. She perceives auditory hallucinations.        Mood and Affect: Mood is depressed. Affect is flat.        Speech: Speech is delayed.        Behavior: Behavior is withdrawn.        Thought Content: Thought content is paranoid and delusional. Thought content includes suicidal ideation.        Cognition and Memory: Cognition is impaired. Memory is impaired.        Judgment: Judgment is impulsive.     Review of Systems  Constitutional: Positive for fatigue. Negative for appetite change.  HENT: Negative for rhinorrhea and sore throat.   Eyes: Negative for  photophobia and visual disturbance.  Respiratory: Negative for cough and shortness of breath.   Cardiovascular: Negative for chest pain and palpitations.  Gastrointestinal: Negative for constipation, diarrhea, nausea and vomiting.  Endocrine: Negative for cold intolerance and heat intolerance.  Genitourinary: Negative for difficulty urinating and dysuria.  Musculoskeletal: Negative for arthralgias and gait problem.  Skin: Negative for rash and wound.  Allergic/Immunologic:  Negative for food allergies and immunocompromised state.  Neurological: Negative for dizziness and headaches.  Hematological: Negative for adenopathy. Does not bruise/bleed easily.  Psychiatric/Behavioral: Positive for agitation, dysphoric mood, hallucinations and suicidal ideas. The patient is nervous/anxious.     Blood pressure 99/67, pulse (!) 105, temperature 97.9 F (36.6 C), temperature source Oral, resp. rate 18, height 5\' 11"  (1.803 m), weight 63.5 kg, last menstrual period 03/30/2020, SpO2 97 %.Body mass index is 19.53 kg/m.  General Appearance: Disheveled and obvious body odor  Eye Contact:  Absent  Speech:  Blocked and Slow  Volume:  Decreased  Mood:  Depressed  Affect:  Blunt  Thought Process:  Disorganized  Orientation:  Full (Time, Place, and Person)  Thought Content:  Delusions, Hallucinations: Auditory, Paranoid Ideation and Rumination  Suicidal Thoughts:  Yes.  without intent/plan  Homicidal Thoughts:  No  Memory:  Immediate;   Poor Recent;   Poor Remote;   Poor  Judgement:  Impaired  Insight:  Lacking  Psychomotor Activity:  Decreased  Concentration:  Concentration: Poor and Attention Span: Poor  Recall:  Poor  Fund of Knowledge:  Poor  Language:  Poor  Akathisia:  Negative  Handed:  Right  AIMS (if indicated):     Assets:  Desire for Improvement Housing Social Support  ADL's:  Impaired  Cognition:  Impaired,  Mild  Sleep:  Number of Hours: 7.5     Treatment Plan Summary: Daily contact with patient to assess and evaluate symptoms and progress in treatment and Medication management PLAN OF CARE:19 year old woman with a historydepression and OCDnot currently in any treatment. Immediate behavior consistent with acute psychosis. Patient is living with her family,not working,not in school,low functioning. Differential diagnosis includesnew onset schizophrenia, psychotic depression, bipolar disorder with psychosis. Continue IVC. Conintue Seroquel to 50  mg in the morning, 300 mg in the evening. Haldol/Ativan/Bendaryl PRN for acute agitation.   24, MD 04/16/2020, 11:15 AM

## 2020-04-16 NOTE — Progress Notes (Signed)
Patient is alert and oriented. She is more clear and more coherent. She has been  more active on the unit and is starting to engage more with others. She denies SI HI  AVH depression anxiety and pain at this encounter. Although she denies all she appears at times to be responding to internal stimuli. She is safe on the unit with 15 minute safety checks and encouraged to contact staff with any concerns.      Robin Butler-Nicholson, LPN

## 2020-04-17 DIAGNOSIS — F23 Brief psychotic disorder: Secondary | ICD-10-CM | POA: Diagnosis not present

## 2020-04-17 MED ORDER — QUETIAPINE FUMARATE ER 200 MG PO TB24
400.0000 mg | ORAL_TABLET | Freq: Every day | ORAL | Status: DC
  Administered 2020-04-17 – 2020-04-22 (×6): 400 mg via ORAL
  Filled 2020-04-17 (×7): qty 2

## 2020-04-17 MED ORDER — QUETIAPINE FUMARATE ER 300 MG PO TB24: 300.0000 mg | ORAL_TABLET | Freq: Every day | ORAL | Status: DC

## 2020-04-17 NOTE — Plan of Care (Signed)
  Problem: Consults Goal: Concurrent Medical Patient Education Description: (See Patient Education Module for education specifics) Outcome: Not Progressing   Problem: [facility] Concurrent Medical Problem Goal: LTG-Pt will be physically stable and he/significant other Description: (Patient will be physically stable and he/significant other will be able to verbalize understanding of follow-up care and symptoms that would warrant further treatment) Outcome: Not Progressing Goal: STG-Vital signs will be within defined limits or stabilized Description: (STG- Vital signs will be within defined limits or stabilized for individual) Outcome: Not Progressing Goal: STG-Compliance with medication and/or treatment as ordered Description: (STG-Compliance with medication and/or treatment as ordered by MD) Outcome: Not Progressing Goal: STG-Verbalize two symptoms that would warrant further Description: (STG-Verbalize two symptoms that would warrant further treatment) Outcome: Not Progressing Goal: STG-Patient will participate in management/stabilization Description: (STG-Patient will participate in management/stabilization of medical condition) Outcome: Not Progressing Goal: STG-Other (Specify): Description: STG-Other Concurrent Medical (Specify): Outcome: Not Progressing   Problem: Education: Goal: Utilization of techniques to improve thought processes will improve Outcome: Not Progressing Goal: Knowledge of the prescribed therapeutic regimen will improve Outcome: Not Progressing   Problem: Activity: Goal: Interest or engagement in leisure activities will improve Outcome: Not Progressing Goal: Imbalance in normal sleep/wake cycle will improve Outcome: Not Progressing   Problem: Coping: Goal: Coping ability will improve Outcome: Not Progressing Goal: Will verbalize feelings Outcome: Not Progressing   Problem: Health Behavior/Discharge Planning: Goal: Ability to make decisions will  improve Outcome: Not Progressing Goal: Compliance with therapeutic regimen will improve Outcome: Not Progressing   Problem: Role Relationship: Goal: Will demonstrate positive changes in social behaviors and relationships Outcome: Not Progressing   Problem: Safety: Goal: Ability to disclose and discuss suicidal ideas will improve Outcome: Not Progressing Goal: Ability to identify and utilize support systems that promote safety will improve Outcome: Not Progressing   Problem: Self-Concept: Goal: Will verbalize positive feelings about self Outcome: Not Progressing Goal: Level of anxiety will decrease Outcome: Not Progressing   

## 2020-04-17 NOTE — Plan of Care (Signed)
Patient tried to elope with the [facility] but is redirected.Patient came to writer states " I apologize for my behavior." Patient maintained personal hygiene with much encouragement.Ate some lunch.Patient denies SI,HI and AVH at this time.

## 2020-04-17 NOTE — Progress Notes (Signed)
Recreation Therapy Notes    Date: 04/17/2020  Time: 9:30 am   Location: Craft room     Behavioral response: N/A   Intervention Topic: Teamwork    Discussion/Intervention: Patient did not attend group.   Clinical Observations/Feedback:  Patient did not attend group.   Morayo Leven LRT/CTRS        Wilene Pharo 04/17/2020 12:01 PM

## 2020-04-17 NOTE — Progress Notes (Signed)
Patient appears in poor hygiene with body odor standing at the nurses station trying to get in.Patient needed redirection multiple times.Patient did not try to eat anything for breakfast.Juice given as per request.Compliant with medications.Resting in bed with eyes closed at this time.

## 2020-04-17 NOTE — Progress Notes (Signed)
[facility] MD Progress Note  04/17/2020 11:16 AM Robin Hess  MRN:  [id number]   Subjective:  19 year old woman brought into the hospital under IVC Patient was brought in by EMS after running away from family running out through the woods with bizarre talking behavior. Reported by EMS that the patient was muttering to herself about being guilty of something or of having killed someone.  Yesterday afternoon patient attempted to elope from unit several times, and was unable to be redirected. She required oral haldol/benadryl/ativan PRN and frequent redirection. This morning, she again required PRNs for similar behavior. She was medication compliant, and did not require physical hold, restraint, or seclusion.   She is assessed one-on-one at bedside later this morning. She remains disorganized in thought process. However, slightly less thought blocking, and speech more coherent. She notes that she feels depressed because she has never helped anyone in her entire life, and feels like a failure. She is aware that she has had some confusing thoughts about killing her friend that she no longer believes is true. She continues to respond to internal stimuli at this time. She is more easily redirectable, and will be encouraged to shower today via staff.  Discussed diagnosis of brief psychosis, medication, and proposed treatment.    Principal Problem: Psychosis (HCC) Diagnosis: Principal Problem:   Psychosis (HCC) Active Problems:   Social anxiety disorder   OCD (obsessive compulsive disorder)  Total Time spent with patient: 30 minutes  Past Psychiatric History: Patient has had 2 prior hospitalizations in 2015 and 2019. Last diagnosis was depression and OCD. Did not follow-up with outpatient treatment. Not currently getting any outpatient treatment. Has made suicidal statements in the past. Recently had not been trying to hurt her self or showing any violence to others. Some history of cannabis use in  the past but currently does not seem to be an active issue.  Past Medical History:  Past Medical History:  Diagnosis Date  . Allergy   . Anxiety   . Cannabis use disorder, moderate, dependence (HCC)   . Depression   . OCD (obsessive compulsive disorder)   . Severe major depression, single episode, with psychotic features (HCC)   . Social anxiety disorder    History reviewed. No pertinent surgical history. Family History: History reviewed. No pertinent family history. Family Psychiatric  History: None reported Social History:  Social History   Substance and Sexual Activity  Alcohol Use No     Social History   Substance and Sexual Activity  Drug Use Yes  . Types: Marijuana    Social History   Socioeconomic History  . Marital status: Single    Spouse name: Not on file  . Number of children: Not on file  . Years of education: Not on file  . Highest education level: Not on file  Occupational History  . Not on file  Tobacco Use  . Smoking status: Never Smoker  . Smokeless tobacco: Never Used  Substance and Sexual Activity  . Alcohol use: No  . Drug use: Yes    Types: Marijuana  . Sexual activity: Never    Birth control/protection: None  Other Topics Concern  . Not on file  Social History Narrative  . Not on file   Social Determinants of Health   Financial Resource Strain:   . Difficulty of Paying Living Expenses: Not on file  Food Insecurity:   . Worried About Programme researcher, broadcasting/film/video in the Last Year: Not on file  . Ran Out  of Food in the Last Year: Not on file  Transportation Needs:   . Lack of Transportation (Medical): Not on file  . Lack of Transportation (Non-Medical): Not on file  Physical Activity:   . Days of Exercise per Week: Not on file  . Minutes of Exercise per Session: Not on file  Stress:   . Feeling of Stress : Not on file  Social Connections:   . Frequency of Communication with Friends and Family: Not on file  . Frequency of Social Gatherings  with Friends and Family: Not on file  . Attends Religious Services: Not on file  . Active Member of Clubs or Organizations: Not on file  . Attends Banker Meetings: Not on file  . Marital Status: Not on file   Additional Social History:                         Sleep: Fair  Appetite:  Fair  Current Medications: Current Facility-Administered Medications  Medication Dose Route Frequency Provider Last Rate Last Admin  . acetaminophen (TYLENOL) tablet 650 mg  650 mg Oral Q6H PRN Clapacs, [name], MD   650 mg at 04/14/20 1217  . alum & mag hydroxide-simeth (MAALOX/MYLANTA) 200-200-20 MG/5ML suspension 30 mL  30 mL Oral Q4H PRN Clapacs, [name], MD      . chlorproMAZINE (THORAZINE) injection 25 mg  25 mg Intramuscular TID PRN [name], MD      . haloperidol (HALDOL) tablet 5 mg  5 mg Oral Q8H PRN [name], MD   5 mg at 04/17/20 0730   And  . LORazepam (ATIVAN) tablet 2 mg  2 mg Oral Q8H PRN [name], MD   2 mg at 04/17/20 6269   And  . diphenhydrAMINE (BENADRYL) capsule 50 mg  50 mg Oral Q8H PRN [name], MD   50 mg at 04/17/20 4854  . feeding supplement (ENSURE ENLIVE / ENSURE PLUS) liquid 237 mL  237 mL Oral BID BM [name], MD   237 mL at 04/16/20 1459  . hydrOXYzine (ATARAX/VISTARIL) tablet 50 mg  50 mg Oral TID PRN Clapacs, [name], MD   50 mg at 04/15/20 2239  . LORazepam (ATIVAN) tablet 2 mg  2 mg Oral Q6H PRN [name], MD   2 mg at 04/15/20 1659   Or  . LORazepam (ATIVAN) injection 2 mg  2 mg Intramuscular Q6H PRN [name], MD      . magnesium hydroxide (MILK OF MAGNESIA) suspension 30 mL  30 mL Oral Daily PRN Clapacs, [name], MD      . QUEtiapine (SEROQUEL XR) 24 hr tablet 300 mg  300 mg Oral QHS [name], MD      . QUEtiapine (SEROQUEL) tablet 50 mg  50 mg Oral Daily [name], MD   50 mg at 04/17/20 6270  . traZODone (DESYREL) tablet 100 mg  100 mg Oral QHS PRN Clapacs, [name], MD   100 mg  at 04/15/20 2239    Lab Results:  No results found for this or any previous visit (from the past 48 hour(s)).  Blood Alcohol level:  Lab Results  Component Value Date   ETH <10 04/12/2020   ETH <10 03/03/2018    Metabolic Disorder Labs: Lab Results  Component Value Date   HGBA1C 4.7 (L) 04/13/2020   MPG 88.19 04/13/2020   MPG 93.93 03/03/2018  Lab Results  Component Value Date   PROLACTIN 17.5 05/02/2014   Lab Results  Component Value Date   CHOL 122 04/13/2020   TRIG 50 04/13/2020   HDL 48 04/13/2020   CHOLHDL 2.5 04/13/2020   VLDL 10 04/13/2020   LDLCALC 64 04/13/2020   LDLCALC 90 03/03/2018    Physical Findings: AIMS: Facial and Oral Movements Muscles of Facial Expression: None, normal Lips and Perioral Area: None, normal Jaw: None, normal Tongue: None, normal,Extremity Movements Upper (arms, wrists, hands, fingers): None, normal Lower (legs, knees, ankles, toes): None, normal, Trunk Movements Neck, shoulders, hips: None, normal, Overall Severity Severity of abnormal movements (highest score from questions above): None, normal Incapacitation due to abnormal movements: None, normal Patient's awareness of abnormal movements (rate only patient's report): No Awareness, Dental Status Current problems with teeth and/or dentures?: No  CIWA:  CIWA-Ar Total: 0 COWS:  COWS Total Score: 2  Musculoskeletal: Strength & Muscle Tone: within normal limits Gait & Station: normal Patient leans: N/A  Psychiatric Specialty Exam: Physical Exam Vitals and nursing note reviewed.  Constitutional:      General: She is in acute distress.  HENT:     Head: Normocephalic and atraumatic.     Right Ear: External ear normal.     Left Ear: External ear normal.     Nose: Nose normal.     Mouth/Throat:     Mouth: Mucous membranes are moist.     Pharynx: Oropharynx is clear.  Eyes:     Extraocular Movements: Extraocular movements intact.     Conjunctiva/sclera: Conjunctivae  normal.     Pupils: Pupils are equal, round, and reactive to light.  Cardiovascular:     Rate and Rhythm: Normal rate.     Pulses: Normal pulses.  Pulmonary:     Effort: Pulmonary effort is normal.     Breath sounds: Normal breath sounds.  Abdominal:     General: Abdomen is flat.     Palpations: Abdomen is soft.  Musculoskeletal:        General: No swelling. Normal range of motion.     Cervical back: Normal range of motion and neck supple.  Skin:    General: Skin is warm and dry.  Neurological:     General: No focal deficit present.     Mental Status: She is alert and oriented to person, place, and time.  Psychiatric:        Attention and Perception: She is inattentive. She perceives auditory hallucinations.        Mood and Affect: Mood is depressed. Affect is flat.        Speech: Speech is delayed.        Behavior: Behavior is withdrawn.        Thought Content: Thought content is paranoid and delusional. Thought content includes suicidal ideation.        Cognition and Memory: Cognition is impaired. Memory is impaired.        Judgment: Judgment is impulsive.     Review of Systems  Constitutional: Positive for fatigue. Negative for appetite change.  HENT: Negative for rhinorrhea and sore throat.   Eyes: Negative for photophobia and visual disturbance.  Respiratory: Negative for cough and shortness of breath.   Cardiovascular: Negative for chest pain and palpitations.  Gastrointestinal: Negative for constipation, diarrhea, nausea and vomiting.  Endocrine: Negative for cold intolerance and heat intolerance.  Genitourinary: Negative for difficulty urinating and dysuria.  Musculoskeletal: Negative for arthralgias and gait problem.  Skin: Negative  for rash and wound.  Allergic/Immunologic: Negative for food allergies and immunocompromised state.  Neurological: Negative for dizziness and headaches.  Hematological: Negative for adenopathy. Does not bruise/bleed easily.   Psychiatric/Behavioral: Positive for agitation, dysphoric mood, hallucinations and suicidal ideas. The patient is nervous/anxious.     Blood pressure 114/82, pulse (!) 102, temperature 98.2 F (36.8 C), temperature source Oral, resp. rate 18, height 5\' 11"  (1.803 m), weight 63.5 kg, last menstrual period 03/30/2020, SpO2 100 %.Body mass index is 19.53 kg/m.  General Appearance: Disheveled and obvious body odor  Eye Contact:  Absent  Speech:  Blocked and Slow  Volume:  Decreased  Mood:  Depressed  Affect:  Blunt  Thought Process:  Disorganized  Orientation:  Full (Time, Place, and Person)  Thought Content:  Delusions, Hallucinations: Auditory, Paranoid Ideation and Rumination  Suicidal Thoughts:  Yes.  without intent/plan  Homicidal Thoughts:  No  Memory:  Immediate;   Poor Recent;   Poor Remote;   Poor  Judgement:  Impaired  Insight:  Lacking  Psychomotor Activity:  Decreased  Concentration:  Concentration: Poor and Attention Span: Poor  Recall:  Poor  Fund of Knowledge:  Poor  Language:  Poor  Akathisia:  Negative  Handed:  Right  AIMS (if indicated):     Assets:  Desire for Improvement Housing Social Support  ADL's:  Impaired  Cognition:  Impaired,  Mild  Sleep:  Number of Hours: 8.25     Treatment Plan Summary: Daily contact with patient to assess and evaluate symptoms and progress in treatment and Medication management PLAN OF CARE:19 year old woman with a historydepression and OCDnot currently in any treatment. Immediate behavior consistent with acute psychosis. Patient is living with her family,not working,not in school,low functioning. Differential diagnosis includesnew onset schizophrenia, psychotic depression, bipolar disorder with psychosis. Continue IVC. Increase Seroquel to 50 mg in the morning, 400 mg in the evening. Haldol/Ativan/Bendaryl PRN for acute agitation.   Jesse SansMegan M Shrita Thien, MD 04/17/2020, 11:16 AM

## 2020-04-17 NOTE — [facility] Group Notes (Signed)
LCSW Group Therapy Note  04/17/2020 1:00 PM  Type of Therapy/Topic:  Group Therapy:  Feelings about Diagnosis  Participation Level:  Did Not Attend   Description of Group:   This group will allow patients to explore their thoughts and feelings about diagnoses they have received. Patients will be guided to explore their level of understanding and acceptance of these diagnoses. Facilitator will encourage patients to process their thoughts and feelings about the reactions of others to their diagnosis and will guide patients in identifying ways to discuss their diagnosis with significant others in their lives. This group will be process-oriented, with patients participating in exploration of their own experiences, giving and receiving support, and processing challenge from other group members.   Therapeutic Goals: 1. Patient will demonstrate understanding of diagnosis as evidenced by identifying two or more symptoms of the disorder 2. Patient will be able to express two feelings regarding the diagnosis 3. Patient will demonstrate their ability to communicate their needs through discussion and/or role play  Summary of Patient Progress: X  Therapeutic Modalities:   Cognitive Behavioral Therapy Brief Therapy Feelings Identification   Lizbett Garciagarcia, MSW, LCSW 04/17/2020 11:13 AM  

## 2020-04-17 NOTE — Progress Notes (Signed)
Patient remains sleep at this time.  She was awaken briefly earlier in the shift for a check in and to make her aware of the shift change. She was encouraged to come to staff with any concerns.  She is safe on the unit at these time with 15 minute safety checks.      Cleo Butler-Nicholson,  LPN

## 2020-04-18 DIAGNOSIS — F23 Brief psychotic disorder: Secondary | ICD-10-CM | POA: Diagnosis not present

## 2020-04-18 NOTE — Progress Notes (Signed)
Patient alert and oriented x 2, with periods of confusion to time and situation, her affect is blunted, thoughts are disorganized, she is not willing to participate in treatment plan. Patient isolated most of the shift in her room , she would come out pace the unit for a while and try to push exit doors.patient was frequently redirected for safety. Patient appears disheveled and has a body odor she was encouraged to take a shower but she refused. Patient was in the dayroom for snack, no interaction with peers, 15 minutes safety checks maintained will continue to monitor.

## 2020-04-18 NOTE — Progress Notes (Signed)
[facility] MD Progress Note  04/18/2020 1:05 PM Robin Hess  MRN:  [id number]   Subjective:  19 year old woman brought into the hospital under IVC Patient was brought in by EMS after running away from family running out through the woods with bizarre talking behavior. Reported by EMS that the patient was muttering to herself about being guilty of something or of having killed someone.  Yesterday afternoon patient attempted to elope from unit, but was able to be redirected. She did not require any additional PRNs after yesterday morning.  SShe was medication compliant, and did not require physical hold, restraint, or seclusion.   She is assessed one-on-one at bedside this morning. She is awake and alert, and speaks at great length this morning. At first, speech is circumstantial, but non-bizarre. She speaks of her childhood and family life in great detail. She notes that she has extremely low self-esteem, and would often push away the other people in her life that were kind to her and treated her well. She would then befriend people who would belittle her and treat her poorly, and was not able to set good boundaries. She also speaks about feeling guilty from stealing candy and toys from her siblings, and not helping around the house. She notes that she feels bad that she never helps anyone, and feels useless for this reason. She realizes now that she had become confused at home, and she does recall thinking she would get the death penalty for killing her father. She is aware that she did not kill her father, and that he is alive and well at home.   However, train of thought eventually derails and she expresses continued paranoid ideations, delusions, and psychosis. She again brings up this website "Bug me not" that she created a username and password in. She notes that it morphed into a conspiracy theory website, and she now believes she is responsible for the death of Chalmers Cater, a president, and  several other innocent people. She notes that she was hearing voices in her head telling her to do bad things and act out at home.   Discussed diagnosis of brief psychosis, medication, and proposed treatment.    Principal Problem: Psychosis (HCC) Diagnosis: Principal Problem:   Psychosis (HCC) Active Problems:   Social anxiety disorder   OCD (obsessive compulsive disorder)  Total Time spent with patient: 1 hour  Past Psychiatric History: Patient has had 2 prior hospitalizations in 2015 and 2019. Last diagnosis was depression and OCD. Did not follow-up with outpatient treatment. Not currently getting any outpatient treatment. Has made suicidal statements in the past. Recently had not been trying to hurt her self or showing any violence to others. Some history of cannabis use in the past but currently does not seem to be an active issue.  Past Medical History:  Past Medical History:  Diagnosis Date  . Allergy   . Anxiety   . Cannabis use disorder, moderate, dependence (HCC)   . Depression   . OCD (obsessive compulsive disorder)   . Severe major depression, single episode, with psychotic features (HCC)   . Social anxiety disorder    History reviewed. No pertinent surgical history. Family History: History reviewed. No pertinent family history. Family Psychiatric  History: None reported Social History:  Social History   Substance and Sexual Activity  Alcohol Use No     Social History   Substance and Sexual Activity  Drug Use Yes  . Types: Marijuana    Social History  Socioeconomic History  . Marital status: Single    Spouse name: Not on file  . Number of children: Not on file  . Years of education: Not on file  . Highest education level: Not on file  Occupational History  . Not on file  Tobacco Use  . Smoking status: Never Smoker  . Smokeless tobacco: Never Used  Substance and Sexual Activity  . Alcohol use: No  . Drug use: Yes    Types: Marijuana  . Sexual  activity: Never    Birth control/protection: None  Other Topics Concern  . Not on file  Social History Narrative  . Not on file   Social Determinants of Health   Financial Resource Strain:   . Difficulty of Paying Living Expenses: Not on file  Food Insecurity:   . Worried About Programme researcher, broadcasting/film/video in the Last Year: Not on file  . Ran Out of Food in the Last Year: Not on file  Transportation Needs:   . Lack of Transportation (Medical): Not on file  . Lack of Transportation (Non-Medical): Not on file  Physical Activity:   . Days of Exercise per Week: Not on file  . Minutes of Exercise per Session: Not on file  Stress:   . Feeling of Stress : Not on file  Social Connections:   . Frequency of Communication with Friends and Family: Not on file  . Frequency of Social Gatherings with Friends and Family: Not on file  . Attends Religious Services: Not on file  . Active Member of Clubs or Organizations: Not on file  . Attends Banker Meetings: Not on file  . Marital Status: Not on file   Additional Social History:                         Sleep: Fair  Appetite:  Fair  Current Medications: Current Facility-Administered Medications  Medication Dose Route Frequency Provider Last Rate Last Admin  . acetaminophen (TYLENOL) tablet 650 mg  650 mg Oral Q6H PRN Clapacs, [name], MD   650 mg at 04/18/20 0902  . alum & mag hydroxide-simeth (MAALOX/MYLANTA) 200-200-20 MG/5ML suspension 30 mL  30 mL Oral Q4H PRN Clapacs, [name], MD      . chlorproMAZINE (THORAZINE) injection 25 mg  25 mg Intramuscular TID PRN [name], MD      . haloperidol (HALDOL) tablet 5 mg  5 mg Oral Q8H PRN [name], MD   5 mg at 04/17/20 0730   And  . LORazepam (ATIVAN) tablet 2 mg  2 mg Oral Q8H PRN [name], MD   2 mg at 04/17/20 3570   And  . diphenhydrAMINE (BENADRYL) capsule 50 mg  50 mg Oral Q8H PRN [name], MD   50 mg at 04/17/20 1779  . feeding supplement  (ENSURE ENLIVE / ENSURE PLUS) liquid 237 mL  237 mL Oral BID BM [name], MD   237 mL at 04/18/20 0907  . hydrOXYzine (ATARAX/VISTARIL) tablet 50 mg  50 mg Oral TID PRN Clapacs, [name], MD   50 mg at 04/15/20 2239  . LORazepam (ATIVAN) tablet 2 mg  2 mg Oral Q6H PRN [name], MD   2 mg at 04/15/20 1659   Or  . LORazepam (ATIVAN) injection 2 mg  2 mg Intramuscular Q6H PRN [name], MD      . magnesium hydroxide (MILK OF MAGNESIA) suspension 30  mL  30 mL Oral Daily PRN Clapacs, [name], MD      . QUEtiapine (SEROQUEL XR) 24 hr tablet 400 mg  400 mg Oral QHS Jesse SansFreeman, Archimedes Harold M, MD   400 mg at 04/17/20 1821  . QUEtiapine (SEROQUEL) tablet 50 mg  50 mg Oral Daily Jesse SansFreeman, Lyal Husted M, MD   50 mg at 04/18/20 0856  . traZODone (DESYREL) tablet 100 mg  100 mg Oral QHS PRN Clapacs, Jackquline DenmarkJohn T, MD   100 mg at 04/15/20 2239    Lab Results:  No results found for this or any previous visit (from the past 48 hour(s)).  Blood Alcohol level:  Lab Results  Component Value Date   ETH <10 04/12/2020   ETH <10 03/03/2018    Metabolic Disorder Labs: Lab Results  Component Value Date   HGBA1C 4.7 (L) 04/13/2020   MPG 88.19 04/13/2020   MPG 93.93 03/03/2018   Lab Results  Component Value Date   PROLACTIN 17.5 05/02/2014   Lab Results  Component Value Date   CHOL 122 04/13/2020   TRIG 50 04/13/2020   HDL 48 04/13/2020   CHOLHDL 2.5 04/13/2020   VLDL 10 04/13/2020   LDLCALC 64 04/13/2020   LDLCALC 90 03/03/2018    Physical Findings: AIMS: Facial and Oral Movements Muscles of Facial Expression: None, normal Lips and Perioral Area: None, normal Jaw: None, normal Tongue: None, normal,Extremity Movements Upper (arms, wrists, hands, fingers): None, normal Lower (legs, knees, ankles, toes): None, normal, Trunk Movements Neck, shoulders, hips: None, normal, Overall Severity Severity of abnormal movements (highest score from questions above): None, normal Incapacitation due to  abnormal movements: None, normal Patient's awareness of abnormal movements (rate only patient's report): No Awareness, Dental Status Current problems with teeth and/or dentures?: No  CIWA:  CIWA-Ar Total: 0 COWS:  COWS Total Score: 2  Musculoskeletal: Strength & Muscle Tone: within normal limits Gait & Station: normal Patient leans: N/A  Psychiatric Specialty Exam: Physical Exam Vitals and nursing note reviewed.  Constitutional:      General: She is in acute distress.  HENT:     Head: Normocephalic and atraumatic.     Right Ear: External ear normal.     Left Ear: External ear normal.     Nose: Nose normal.     Mouth/Throat:     Mouth: Mucous membranes are moist.     Pharynx: Oropharynx is clear.  Eyes:     Extraocular Movements: Extraocular movements intact.     Conjunctiva/sclera: Conjunctivae normal.     Pupils: Pupils are equal, round, and reactive to light.  Cardiovascular:     Rate and Rhythm: Normal rate.     Pulses: Normal pulses.  Pulmonary:     Effort: Pulmonary effort is normal.     Breath sounds: Normal breath sounds.  Abdominal:     General: Abdomen is flat.     Palpations: Abdomen is soft.  Musculoskeletal:        General: No swelling. Normal range of motion.     Cervical back: Normal range of motion and neck supple.  Skin:    General: Skin is warm and dry.  Neurological:     General: No focal deficit present.     Mental Status: She is alert and oriented to person, place, and time.  Psychiatric:        Attention and Perception: She is inattentive. She perceives auditory hallucinations.        Mood and Affect: Mood is depressed. Affect  is tearful.        Speech: Speech is tangential.        Behavior: Behavior is withdrawn.        Thought Content: Thought content is paranoid and delusional. Thought content includes suicidal ideation.        Cognition and Memory: Cognition is impaired. Memory is impaired.        Judgment: Judgment is impulsive.      Review of Systems  Constitutional: Positive for fatigue. Negative for appetite change.  HENT: Negative for rhinorrhea and sore throat.   Eyes: Negative for photophobia and visual disturbance.  Respiratory: Negative for cough and shortness of breath.   Cardiovascular: Negative for chest pain and palpitations.  Gastrointestinal: Negative for constipation, diarrhea, nausea and vomiting.  Endocrine: Negative for cold intolerance and heat intolerance.  Genitourinary: Negative for difficulty urinating and dysuria.  Musculoskeletal: Negative for arthralgias and gait problem.  Skin: Negative for rash and wound.  Allergic/Immunologic: Negative for food allergies and immunocompromised state.  Neurological: Negative for dizziness and headaches.  Hematological: Negative for adenopathy. Does not bruise/bleed easily.  Psychiatric/Behavioral: Positive for agitation, dysphoric mood, hallucinations and suicidal ideas. The patient is nervous/anxious.     Blood pressure 103/70, pulse 85, temperature 99 F (37.2 C), temperature source Oral, resp. rate 18, height 5\' 11"  (1.803 m), weight 63.5 kg, last menstrual period 03/30/2020, SpO2 98 %.Body mass index is 19.53 kg/m.  General Appearance: Disheveled  Eye Contact:  Fair  Speech:  Slow  Volume:  Decreased  Mood:  Depressed  Affect:  Tearful  Thought Process:  Disorganized  Orientation:  Full (Time, Place, and Person)  Thought Content:  Delusions, Hallucinations: Auditory, Paranoid Ideation and Rumination  Suicidal Thoughts:  Yes.  without intent/plan  Homicidal Thoughts:  No  Memory:  Immediate;   Fair Recent;   Fair Remote;   Fair  Judgement:  Impaired  Insight:  Lacking  Psychomotor Activity:  Decreased  Concentration:  Concentration: Poor and Attention Span: Poor  Recall:  13/09/2019 of Knowledge:  Poor  Language:  Fair  Akathisia:  Negative  Handed:  Right  AIMS (if indicated):     Assets:  Desire for Improvement Housing Social Support   ADL's:  Impaired  Cognition:  Impaired,  Mild  Sleep:  Number of Hours: 7.5     Treatment Plan Summary: Daily contact with patient to assess and evaluate symptoms and progress in treatment and Medication management PLAN OF CARE:19 year old woman with a historydepression and OCDnot currently in any treatment. Immediate behavior consistent with acute psychosis. Patient is living with her family,not working,not in school,low functioning. As patient's psychosis has began to clear her diagnosis seems most consistent with MDD, recurrent, severe with psychotic features. However,differential diagnosis continues to includenew onset schizophrenia, bipolar disorder with psychosis. Continue IVC. Continue Seroquel to 50 mg in the morning, 400 mg in the evening. Haldol/Ativan/Bendaryl PRN for acute agitation.   24, MD 04/18/2020, 1:05 PM

## 2020-04-18 NOTE — Plan of Care (Signed)
  Problem: Group Participation Goal: STG - Patient will engage in groups without prompting or encouragement from LRT x3 group sessions within 5 recreation therapy group sessions Description: STG - Patient will engage in groups without prompting or encouragement from LRT x3 group sessions within 5 recreation therapy group sessions Outcome: Not Progressing   

## 2020-04-18 NOTE — Progress Notes (Signed)
D: Pt alert and oriented. Pt reports experiencing anxiety/depression however doesn't rate them. Pt reports experiencing generalized pain, prn meds given. Pt denies experiencing any SI/HI, or VH at this time, however endorses experiencing AH. Pt does not give any further details about AH.   Pt appears to have some improvement this afternoon. Pt appears to have taken a shower or at the very least rinsed off and put on clean scrubs this afternoon. Pt also requested to speak with her father and spoke on the phone with him more than 10 minutes. This evening this pt came to this writer stating she was ready for her medications. This Clinical research associate asked the pt to follow me to the medication room to see what she has, pt complied. Once in the medication room it was seen she did not have any scheduled medication until bedtime, however she does have some prn medication. This was explained to the pt. Pt was asked if she had any anxiety, pt stated no then minutes later came back and asked for the medication stating she does have anxiety. PRN medication given. Pt shared that she had a good conversation with her father today and that she feel as if she's doing better. Pt shares she ate a cheeseburger and fries. Pt states she is eating more that what she was. Pt then asked if it was okay for her to go take a nap. This Clinical research associate thanked her for sharing, stated I'm glad she's feeling better, and yes she could go take a nap if she wished to.   A: Scheduled medications administered to pt, per MD orders. Support and encouragement provided. Frequent verbal contact made. Routine safety checks conducted q15 minutes.   R: No adverse drug reactions noted. Pt verbally contracts for safety at this time. Pt complaint with medications. Pt interacts well with others on the unit. Pt remains safe at this time. Will continue to monitor.

## 2020-04-18 NOTE — Tx Team (Signed)
Interdisciplinary Treatment and Diagnostic Plan Update  04/18/2020 Time of Session: 8:30AM [name] MRN: [id number]  Principal Diagnosis: Psychosis [facility])  Secondary Diagnoses: Principal Problem:   Psychosis (HCC) Active Problems:   Social anxiety disorder   OCD (obsessive compulsive disorder)   Current Medications:  Current Facility-Administered Medications  Medication Dose Route Frequency Provider Last Rate Last Admin  . acetaminophen (TYLENOL) tablet 650 mg  650 mg Oral Q6H PRN Clapacs, [name], MD   650 mg at 04/18/20 0902  . alum & mag hydroxide-simeth (MAALOX/MYLANTA) 200-200-20 MG/5ML suspension 30 mL  30 mL Oral Q4H PRN Clapacs, [name], MD      . chlorproMAZINE (THORAZINE) injection 25 mg  25 mg Intramuscular TID PRN [name], MD      . haloperidol (HALDOL) tablet 5 mg  5 mg Oral Q8H PRN [name], MD   5 mg at 04/17/20 0730   And  . LORazepam (ATIVAN) tablet 2 mg  2 mg Oral Q8H PRN [name], MD   2 mg at 04/17/20 5621   And  . diphenhydrAMINE (BENADRYL) capsule 50 mg  50 mg Oral Q8H PRN [name], MD   50 mg at 04/17/20 3086  . feeding supplement (ENSURE ENLIVE / ENSURE PLUS) liquid 237 mL  237 mL Oral BID BM [name], MD   237 mL at 04/18/20 0907  . hydrOXYzine (ATARAX/VISTARIL) tablet 50 mg  50 mg Oral TID PRN Clapacs, [name], MD   50 mg at 04/15/20 2239  . LORazepam (ATIVAN) tablet 2 mg  2 mg Oral Q6H PRN [name], MD   2 mg at 04/15/20 1659   Or  . LORazepam (ATIVAN) injection 2 mg  2 mg Intramuscular Q6H PRN [name], MD      . magnesium hydroxide (MILK OF MAGNESIA) suspension 30 mL  30 mL Oral Daily PRN Clapacs, [name], MD      . QUEtiapine (SEROQUEL XR) 24 hr tablet 400 mg  400 mg Oral QHS [name], MD   400 mg at 04/17/20 1821  . QUEtiapine (SEROQUEL) tablet 50 mg  50 mg Oral Daily [name], MD   50 mg at 04/18/20 0856  . traZODone (DESYREL) tablet 100 mg  100 mg Oral QHS PRN Clapacs, [name],  MD   100 mg at 04/15/20 2239   PTA Medications: Medications Prior to Admission  Medication Sig Dispense Refill Last Dose  . fluvoxaMINE (LUVOX) 100 MG tablet Take 1 tablet (100 mg total) by mouth at bedtime. (Patient not taking: Reported on 04/12/2020) 30 tablet 1   . hydrOXYzine (ATARAX/VISTARIL) 50 MG tablet Take 1 tablet (50 mg total) by mouth 3 (three) times daily as needed for anxiety. (Patient not taking: Reported on 04/12/2020) 90 tablet 1   . lamoTRIgine (LAMICTAL) 25 MG tablet Take 1 tablet (25 mg total) by mouth at bedtime. Take 1 tab for two weeks, 2 tabs for 2 weeks, 4 tabs for 2 weeks, 8 tabs or 200 mg after (Patient not taking: Reported on 04/12/2020) 45 tablet 1   . prazosin (MINIPRESS) 1 MG capsule Take 1 capsule (1 mg total) by mouth 2 (two) times daily. (Patient not taking: Reported on 04/12/2020) 60 capsule 1   . traZODone (DESYREL) 100 MG tablet Take 1 tablet (100 mg total) by mouth at bedtime as needed for sleep. (Patient not taking: Reported on 04/12/2020) 30 tablet 1     Patient Stressors:  Patient Strengths:    Treatment Modalities: Medication Management, Group therapy, Case management,  1 to 1 session with clinician, Psychoeducation, Recreational therapy.   Physician Treatment Plan for Primary Diagnosis: Psychosis (HCC) Long Term Goal(s): Improvement in symptoms so as ready for discharge Improvement in symptoms so as ready for discharge   Short Term Goals: Ability to identify changes in lifestyle to reduce recurrence of condition will improve Ability to verbalize feelings will improve Ability to disclose and discuss suicidal ideas Ability to demonstrate self-control will improve Ability to identify and develop effective coping behaviors will improve Compliance with prescribed medications will improve Ability to identify changes in lifestyle to reduce recurrence of condition will improve Ability to verbalize feelings will improve Ability to disclose and  discuss suicidal ideas Ability to demonstrate self-control will improve Ability to identify and develop effective coping behaviors will improve Compliance with prescribed medications will improve  Medication Management: Evaluate patient's response, side effects, and tolerance of medication regimen.  Therapeutic Interventions: 1 to 1 sessions, Unit Group sessions and Medication administration.  Evaluation of Outcomes: Not Progressing  Physician Treatment Plan for Secondary Diagnosis: Principal Problem:   Psychosis (HCC) Active Problems:   Social anxiety disorder   OCD (obsessive compulsive disorder)  Long Term Goal(s): Improvement in symptoms so as ready for discharge Improvement in symptoms so as ready for discharge   Short Term Goals: Ability to identify changes in lifestyle to reduce recurrence of condition will improve Ability to verbalize feelings will improve Ability to disclose and discuss suicidal ideas Ability to demonstrate self-control will improve Ability to identify and develop effective coping behaviors will improve Compliance with prescribed medications will improve Ability to identify changes in lifestyle to reduce recurrence of condition will improve Ability to verbalize feelings will improve Ability to disclose and discuss suicidal ideas Ability to demonstrate self-control will improve Ability to identify and develop effective coping behaviors will improve Compliance with prescribed medications will improve     Medication Management: Evaluate patient's response, side effects, and tolerance of medication regimen.  Therapeutic Interventions: 1 to 1 sessions, Unit Group sessions and Medication administration.  Evaluation of Outcomes: Not Progressing   RN Treatment Plan for Primary Diagnosis: Psychosis (HCC) Long Term Goal(s): Knowledge of disease and therapeutic regimen to maintain health will improve  Short Term Goals: Ability to verbalize frustration and  anger appropriately will improve, Ability to participate in decision making will improve, Ability to verbalize feelings will improve, Ability to identify and develop effective coping behaviors will improve and Compliance with prescribed medications will improve  Medication Management: RN will administer medications as ordered by provider, will assess and evaluate patient's response and provide education to patient for prescribed medication. RN will report any adverse and/or side effects to prescribing provider.  Therapeutic Interventions: 1 on 1 counseling sessions, Psychoeducation, Medication administration, Evaluate responses to treatment, Monitor vital signs and CBGs as ordered, Perform/monitor CIWA, COWS, AIMS and Fall Risk screenings as ordered, Perform wound care treatments as ordered.  Evaluation of Outcomes: Not Progressing   LCSW Treatment Plan for Primary Diagnosis: Psychosis (HCC) Long Term Goal(s): Safe transition to appropriate next level of care at discharge, Engage patient in therapeutic group addressing interpersonal concerns.  Short Term Goals: Engage patient in aftercare planning with referrals and resources, Increase social support, Increase ability to appropriately verbalize feelings, Facilitate acceptance of mental health diagnosis and concerns, Identify triggers associated with mental health/substance abuse issues and Increase skills for wellness and recovery  Therapeutic Interventions: Assess for all discharge  needs, 1 to 1 time with Child psychotherapist, Explore available resources and support systems, Assess for adequacy in community support network, Educate family and significant other(s) on suicide prevention, Complete Psychosocial Assessment, Interpersonal group therapy.  Evaluation of Outcomes: Not Progressing   Progress in Treatment: Attending groups: No. Participating in groups: No. Taking medication as prescribed: Yes. Toleration medication: Yes. Family/Significant  other contact made: Yes, individual(s) contacted:  CSW made contact with father Patient understands diagnosis: No. Discussing patient identified problems/goals with staff: No. Medical problems stabilized or resolved: Yes. Denies suicidal/homicidal ideation: Yes. Issues/concerns per patient self-inventory: No. Other: None.  New problem(s) identified: No, Describe:  None. Update (04/18/20) no changes.  New Short Term/Long Term Goal(s): elimination of symptoms of psychosis, medication management for mood stabilization; elimination of SI thoughts; development of comprehensive mental wellness/sobriety plan. Update (06/19/19) Patient was able to have brief (30 minutes) conversation with physician.   Patient Goals: "Work on myself." "Update (04/18/20) nothing significant to report.   Discharge Plan or Barriers: CSW will assist with aftercare planning as needed.   Reason for Continuation of Hospitalization: Anxiety Delusions  Depression Hallucinations Medication stabilization  Estimated Length of Stay: 1-7 days  Attendees: Patient: Robin Hess 04/18/2020 11:47 AM  Physician: Les Pou, MD 04/18/2020 11:47 AM  Nursing: Doyce Para, RN 04/18/2020 11:47 AM  RN Care Manager: 04/18/2020 11:47 AM  Social Worker: Penni Homans, MSW, LCSW 04/18/2020 11:47 AM  Recreational Therapist: Hilbert Bible, LRT  04/18/2020 11:47 AM  Other: Gwenevere Ghazi, Theresia Majors, MSW 04/18/2020 11:47 AM  Other: Vilma Meckel. Algis Greenhouse, MSW, [place], LCAS 04/18/2020 11:47 AM  Other: 04/18/2020 11:47 AM    Scribe for Treatment Team: Corky Crafts, LCSWA 04/18/2020 11:47 AM

## 2020-04-18 NOTE — Progress Notes (Signed)
Recreation Therapy Notes  Date: 04/18/2020  Time: 9:30 am   Location: Craft room     Behavioral response: N/A   Intervention Topic: Leisure     Discussion/Intervention: Patient did not attend group.   Clinical Observations/Feedback:  Patient did not attend group.   Lalah Durango LRT/CTRS         Eddie Koc 04/18/2020 11:45 AM

## 2020-04-19 DIAGNOSIS — F23 Brief psychotic disorder: Secondary | ICD-10-CM | POA: Diagnosis not present

## 2020-04-19 NOTE — Plan of Care (Signed)
Pt in room all shift sleeping. Pt awakened for medication pass and her snacks at hs. Pt self isolatory. Pt denies SI/HI/AVH. Pt responds appropriately to questions. Q15 min safety checks maintained.  Problem: Consults Goal: Concurrent Medical Patient Education Description: (See Patient Education Module for education specifics) Outcome: Progressing   Problem: [facility] Concurrent Medical Problem Goal: LTG-Pt will be physically stable and he/significant other Description: (Patient will be physically stable and he/significant other will be able to verbalize understanding of follow-up care and symptoms that would warrant further treatment) Outcome: Progressing Goal: STG-Vital signs will be within defined limits or stabilized Description: (STG- Vital signs will be within defined limits or stabilized for individual) Outcome: Progressing Goal: STG-Compliance with medication and/or treatment as ordered Description: (STG-Compliance with medication and/or treatment as ordered by MD) Outcome: Progressing Goal: STG-Verbalize two symptoms that would warrant further Description: (STG-Verbalize two symptoms that would warrant further treatment) Outcome: Progressing Goal: STG-Patient will participate in management/stabilization Description: (STG-Patient will participate in management/stabilization of medical condition) Outcome: Progressing Goal: STG-Other (Specify): Description: STG-Other Concurrent Medical (Specify): Outcome: Progressing   Problem: Education: Goal: Utilization of techniques to improve thought processes will improve Outcome: Progressing Goal: Knowledge of the prescribed therapeutic regimen will improve Outcome: Progressing   Problem: Activity: Goal: Interest or engagement in leisure activities will improve Outcome: Progressing Goal: Imbalance in normal sleep/wake cycle will improve Outcome: Progressing   Problem: Coping: Goal: Coping ability will improve Outcome: Progressing Goal:  Will verbalize feelings Outcome: Progressing   Problem: Health Behavior/Discharge Planning: Goal: Ability to make decisions will improve Outcome: Progressing Goal: Compliance with therapeutic regimen will improve Outcome: Progressing   Problem: Role Relationship: Goal: Will demonstrate positive changes in social behaviors and relationships Outcome: Progressing   Problem: Safety: Goal: Ability to disclose and discuss suicidal ideas will improve Outcome: Progressing Goal: Ability to identify and utilize support systems that promote safety will improve Outcome: Progressing   Problem: Self-Concept: Goal: Will verbalize positive feelings about self Outcome: Progressing Goal: Level of anxiety will decrease Outcome: Progressing

## 2020-04-19 NOTE — Progress Notes (Signed)
D: Pt alert and oriented. Pt denies experiencing any anxiety/depression at this time however appears anxious at times. Pt reports that she slept well. Pt denies experiencing any pain at this time. Pt denies experiencing any SI/HI, or AVH at this time, however appears to still be experiencing AH at times.  Pt did take a shower and put on clean scrubs this afternoon. Pt is able to carrying on a very small conversation and better express her needs. Pt appears to be making some improvements.   A: Scheduled medications administered to pt, per MD orders. Support and encouragement provided. Frequent verbal contact made. Routine safety checks conducted q15 minutes.   R: No adverse drug reactions noted. Pt verbally contracts for safety at this time. Pt complaint with medications. Pt interacts well with others on the unit. Pt remains safe at this time. Will continue to monitor.

## 2020-04-19 NOTE — Progress Notes (Signed)
Patient alert and oriented x 3, with periods of confusion to situation, affect is blunted thoughts are organized, she appears less anxious, she wasn't pacing or restless, she isolated to her room most of the shift but was present for medication and snacks. Patient currently denies SI/HI/AVH, 15 mijnutes safety checks maintained will continue to monitor.

## 2020-04-19 NOTE — Progress Notes (Signed)
[facility] MD Progress Note  04/19/2020 6:58 AM Robin Hess  MRN:  [id number] Subjective: Follow-up young woman with psychotic disorder.  Ultimate diagnosis still uncertain possible early development of schizophrenia-like process.  Patient has been cooperative with medicine and has shown improvement.  Less agitated yesterday.  Slept much better last night.  Still disorganized in her thinking scattered mostly focused on anxious thoughts regarding her family.  No suicidal threats or discussion.  No new physical complaints. Principal Problem: Psychosis (HCC) Diagnosis: Principal Problem:   Psychosis (HCC) Active Problems:   Social anxiety disorder   OCD (obsessive compulsive disorder)  Total Time spent with patient: 30 minutes  Past Psychiatric History: Patient has a past history of anxiety disorder and OCD with recently more development of psychotic-like symptoms  Past Medical History:  Past Medical History:  Diagnosis Date  . Allergy   . Anxiety   . Cannabis use disorder, moderate, dependence (HCC)   . Depression   . OCD (obsessive compulsive disorder)   . Severe major depression, single episode, with psychotic features (HCC)   . Social anxiety disorder    History reviewed. No pertinent surgical history. Family History: History reviewed. No pertinent family history. Family Psychiatric  History: See previous Social History:  Social History   Substance and Sexual Activity  Alcohol Use No     Social History   Substance and Sexual Activity  Drug Use Yes  . Types: Marijuana    Social History   Socioeconomic History  . Marital status: Single    Spouse name: Not on file  . Number of children: Not on file  . Years of education: Not on file  . Highest education level: Not on file  Occupational History  . Not on file  Tobacco Use  . Smoking status: Never Smoker  . Smokeless tobacco: Never Used  Substance and Sexual Activity  . Alcohol use: No  . Drug use: Yes    Types:  Marijuana  . Sexual activity: Never    Birth control/protection: None  Other Topics Concern  . Not on file  Social History Narrative  . Not on file   Social Determinants of Health   Financial Resource Strain:   . Difficulty of Paying Living Expenses: Not on file  Food Insecurity:   . Worried About Programme researcher, broadcasting/film/video in the Last Year: Not on file  . Ran Out of Food in the Last Year: Not on file  Transportation Needs:   . Lack of Transportation (Medical): Not on file  . Lack of Transportation (Non-Medical): Not on file  Physical Activity:   . Days of Exercise per Week: Not on file  . Minutes of Exercise per Session: Not on file  Stress:   . Feeling of Stress : Not on file  Social Connections:   . Frequency of Communication with Friends and Family: Not on file  . Frequency of Social Gatherings with Friends and Family: Not on file  . Attends Religious Services: Not on file  . Active Member of Clubs or Organizations: Not on file  . Attends Banker Meetings: Not on file  . Marital Status: Not on file   Additional Social History:                         Sleep: Good  Appetite:  Fair  Current Medications: Current Facility-Administered Medications  Medication Dose Route Frequency Provider Last Rate Last Admin  . acetaminophen (TYLENOL) tablet 650 mg  650 mg Oral Q6H PRN Mayetta Castleman, [name], MD   650 mg at 04/18/20 0902  . alum & mag hydroxide-simeth (MAALOX/MYLANTA) 200-200-20 MG/5ML suspension 30 mL  30 mL Oral Q4H PRN Shahin Knierim T, MD      . chlorproMAZINE (THORAZINE) injection 25 mg  25 mg Intramuscular TID PRN [name], MD      . haloperidol (HALDOL) tablet 5 mg  5 mg Oral Q8H PRN [name], MD   5 mg at 04/18/20 2119   And  . LORazepam (ATIVAN) tablet 2 mg  2 mg Oral Q8H PRN [name], MD   2 mg at 04/18/20 2119   And  . diphenhydrAMINE (BENADRYL) capsule 50 mg  50 mg Oral Q8H PRN [name], MD   50 mg at 04/18/20 2119  .  feeding supplement (ENSURE ENLIVE / ENSURE PLUS) liquid 237 mL  237 mL Oral BID BM [name], MD   237 mL at 04/18/20 1400  . hydrOXYzine (ATARAX/VISTARIL) tablet 50 mg  50 mg Oral TID PRN Anesia Blackwell, [name], MD   50 mg at 04/18/20 1720  . LORazepam (ATIVAN) tablet 2 mg  2 mg Oral Q6H PRN [name], MD   2 mg at 04/15/20 1659   Or  . LORazepam (ATIVAN) injection 2 mg  2 mg Intramuscular Q6H PRN [name], MD      . magnesium hydroxide (MILK OF MAGNESIA) suspension 30 mL  30 mL Oral Daily PRN Weldon Nouri T, MD      . QUEtiapine (SEROQUEL XR) 24 hr tablet 400 mg  400 mg Oral QHS [name], MD   400 mg at 04/18/20 2100  . QUEtiapine (SEROQUEL) tablet 50 mg  50 mg Oral Daily [name], MD   50 mg at 04/18/20 0856  . traZODone (DESYREL) tablet 100 mg  100 mg Oral QHS PRN Hero Mccathern, [name], MD   100 mg at 04/15/20 2239    Lab Results: No results found for this or any previous visit (from the past 48 hour(s)).  Blood Alcohol level:  Lab Results  Component Value Date   ETH <10 04/12/2020   ETH <10 03/03/2018    Metabolic Disorder Labs: Lab Results  Component Value Date   HGBA1C 4.7 (L) 04/13/2020   MPG 88.19 04/13/2020   MPG 93.93 03/03/2018   Lab Results  Component Value Date   PROLACTIN 17.5 05/02/2014   Lab Results  Component Value Date   CHOL 122 04/13/2020   TRIG 50 04/13/2020   HDL 48 04/13/2020   CHOLHDL 2.5 04/13/2020   VLDL 10 04/13/2020   LDLCALC 64 04/13/2020   LDLCALC 90 03/03/2018    Physical Findings: AIMS: Facial and Oral Movements Muscles of Facial Expression: None, normal Lips and Perioral Area: None, normal Jaw: None, normal Tongue: None, normal,Extremity Movements Upper (arms, wrists, hands, fingers): None, normal Lower (legs, knees, ankles, toes): None, normal, Trunk Movements Neck, shoulders, hips: None, normal, Overall Severity Severity of abnormal movements (highest score from questions above): None,  normal Incapacitation due to abnormal movements: None, normal Patient's awareness of abnormal movements (rate only patient's report): No Awareness, Dental Status Current problems with teeth and/or dentures?: No  CIWA:  CIWA-Ar Total: 0 COWS:  COWS Total Score: 2  Musculoskeletal: Strength & Muscle Tone: within normal limits Gait & Station: normal Patient leans: N/A  Psychiatric Specialty Exam: Physical Exam Vitals and nursing note reviewed.  Constitutional:  Appearance: She is well-developed.  HENT:     Head: Normocephalic and atraumatic.  Eyes:     Conjunctiva/sclera: Conjunctivae normal.     Pupils: Pupils are equal, round, and reactive to light.  Cardiovascular:     Heart sounds: Normal heart sounds.  Pulmonary:     Effort: Pulmonary effort is normal.  Abdominal:     Palpations: Abdomen is soft.  Musculoskeletal:        General: Normal range of motion.     Cervical back: Normal range of motion.  Skin:    General: Skin is warm and dry.  Neurological:     General: No focal deficit present.     Mental Status: She is alert.  Psychiatric:        Attention and Perception: She is inattentive.        Mood and Affect: Mood is anxious. Affect is blunt.        Speech: Speech is delayed.        Behavior: Behavior is slowed.        Thought Content: Thought content does not include homicidal or suicidal ideation.        Cognition and Memory: Cognition is impaired.        Judgment: Judgment is impulsive.     Review of Systems  Constitutional: Negative.   HENT: Negative.   Eyes: Negative.   Respiratory: Negative.   Cardiovascular: Negative.   Gastrointestinal: Negative.   Musculoskeletal: Negative.   Skin: Negative.   Neurological: Negative.   Psychiatric/Behavioral: Positive for behavioral problems and dysphoric mood. The patient is nervous/anxious.     Blood pressure 103/70, pulse 85, temperature 99 F (37.2 C), temperature source Oral, resp. rate 18, height 5\' 11"   (1.803 m), weight 63.5 kg, last menstrual period 03/30/2020, SpO2 98 %.Body mass index is 19.53 kg/m.  General Appearance: Casual  Eye Contact:  Minimal  Speech:  Slow  Volume:  Decreased  Mood:  Dysphoric  Affect:  Depressed  Thought Process:  Disorganized  Orientation:  Full (Time, Place, and Person)  Thought Content:  Rumination and Tangential  Suicidal Thoughts:  No  Homicidal Thoughts:  No  Memory:  Immediate;   Fair Recent;   Fair Remote;   Fair  Judgement:  Impaired  Insight:  Shallow  Psychomotor Activity:  Decreased  Concentration:  Concentration: Fair  Recall:  13/09/2019 of Knowledge:  Fair  Language:  Fair  Akathisia:  No  Handed:  Right  AIMS (if indicated):     Assets:  Desire for Improvement Housing Physical Health Social Support  ADL's:  Impaired  Cognition:  Impaired,  Mild  Sleep:  Number of Hours: 8.25     Treatment Plan Summary: Daily contact with patient to assess and evaluate symptoms and progress in treatment, Medication management and Plan Tolerating medicine and cooperative with it.  Slightly more organized.  Still confused and mildly paranoid.  No change to medication plan for today.  Supportive counseling and encouragement.  [employer], MD 04/19/2020, 6:58 AM

## 2020-04-20 NOTE — [facility] Group Notes (Signed)
LCSW Group Therapy Note  04/20/2020 2:18 PM  Type of Therapy and Topic:  Group Therapy:  Feelings around Relapse and Recovery  Participation Level:  Minimal   Description of Group:    Patients in this group will discuss emotions they experience before and after a relapse. They will process how experiencing these feelings, or avoidance of experiencing them, relates to having a relapse. Facilitator will guide patients to explore emotions they have related to recovery. Patients will be encouraged to process which emotions are more powerful. They will be guided to discuss the emotional reaction significant others in their lives may have to their relapse or recovery. Patients will be assisted in exploring ways to respond to the emotions of others without this contributing to a relapse.  Therapeutic Goals: 1. Patient will identify two or more emotions that lead to a relapse for them 2. Patient will identify two emotions that result when they relapse 3. Patient will identify two emotions related to recovery 4. Patient will demonstrate ability to communicate their needs through discussion and/or role plays   Summary of Patient Progress: Patient showed up to group sessions half way into the discussion. Patient seldomly shared experiences related to the topic, her voice was low and inaudible when sharing. Patient shared that she has attended group psychotherapy sessions in the past which were helpful in managing MH symptoms.      Therapeutic Modalities:   Cognitive Behavioral Therapy Solution-Focused Therapy Assertiveness Training Relapse Prevention Therapy   Gwenevere Ghazi, MSW, [place], [place] 04/20/2020 2:18 PM

## 2020-04-20 NOTE — Plan of Care (Signed)
Patient makes more logical conversation with staff today.Patient states " I still have some feeling that the world is going to end and it is all because of the things I did wrong." Patient also stated that she miss her family.Denies SI,HI and AVH.Personal hygiene maintained.Appetite fair.Compliant with medications.Support and encouragement given.

## 2020-04-20 NOTE — Progress Notes (Addendum)
[facility] MD Progress Note  04/20/2020 12:22 PM Robin Hess  MRN:  [id number] Subjective:  19 year old female who was seen by Dr. Neale Burly for psychiatric evaluation on April 13, 2020.  She had exhibited erratic and bizarre behaviors, running away from family into the woods.  She is been seeing the therapist for the first time today.  Patient indicates that she is "sort of" feeling better today.  Indicates that "everything is so surreal.  I feel like my body is not my own.  Like him being controlled."  She denies auditory and visual hallucinations.  She did sleep well last night.  Appetite is fair.  Reports mild dizziness.  Patient happy birthday today.  Principal Problem: Psychosis (HCC) Diagnosis: Principal Problem:   Psychosis (HCC) Active Problems:   Social anxiety disorder   OCD (obsessive compulsive disorder)  Total Time spent with patient: 30 minutes  Past Psychiatric History: Patient has a past history of anxiety disorder and OCD with recently more development of psychotic-like symptoms  Past Medical History:  Past Medical History:  Diagnosis Date   Allergy    Anxiety    Cannabis use disorder, moderate, dependence (HCC)    Depression    OCD (obsessive compulsive disorder)    Severe major depression, single episode, with psychotic features (HCC)    Social anxiety disorder    History reviewed. No pertinent surgical history. Family History: History reviewed. No pertinent family history. Family Psychiatric  History: See previous Social History:  Social History   Substance and Sexual Activity  Alcohol Use No     Social History   Substance and Sexual Activity  Drug Use Yes   Types: Marijuana    Social History   Socioeconomic History   Marital status: Single    Spouse name: Not on file   Number of children: Not on file   Years of education: Not on file   Highest education level: Not on file  Occupational History   Not on file  Tobacco Use    Smoking status: Never Smoker   Smokeless tobacco: Never Used  Substance and Sexual Activity   Alcohol use: No   Drug use: Yes    Types: Marijuana   Sexual activity: Never    Birth control/protection: None  Other Topics Concern   Not on file  Social History Narrative   Not on file   Social Determinants of Health   Financial Resource Strain:    Difficulty of Paying Living Expenses: Not on file  Food Insecurity:    Worried About Programme researcher, broadcasting/film/video in the Last Year: Not on file   [employer] of Food in the Last Year: Not on file  Transportation Needs:    Lack of Transportation (Medical): Not on file   Lack of Transportation (Non-Medical): Not on file  Physical Activity:    Days of Exercise per Week: Not on file   Minutes of Exercise per Session: Not on file  Stress:    Feeling of Stress : Not on file  Social Connections:    Frequency of Communication with Friends and Family: Not on file   Frequency of Social Gatherings with Friends and Family: Not on file   Attends Religious Services: Not on file   Active Member of Clubs or Organizations: Not on file   Attends Banker Meetings: Not on file   Marital Status: Not on file   Additional Social History:  Sleep: Good  Appetite:  Fair  Current Medications: Current Facility-Administered Medications  Medication Dose Route Frequency Provider Last Rate Last Admin   acetaminophen (TYLENOL) tablet 650 mg  650 mg Oral Q6H PRN Clapacs, [name], MD   650 mg at 04/18/20 0902   alum & mag hydroxide-simeth (MAALOX/MYLANTA) 200-200-20 MG/5ML suspension 30 mL  30 mL Oral Q4H PRN Clapacs, [name], MD       chlorproMAZINE (THORAZINE) injection 25 mg  25 mg Intramuscular TID PRN [name], MD       haloperidol (HALDOL) tablet 5 mg  5 mg Oral Q8H PRN [name], MD   5 mg at 04/18/20 2119   And   LORazepam (ATIVAN) tablet 2 mg  2 mg Oral Q8H PRN [name], MD    2 mg at 04/18/20 2119   And   diphenhydrAMINE (BENADRYL) capsule 50 mg  50 mg Oral Q8H PRN [name], MD   50 mg at 04/18/20 2119   feeding supplement (ENSURE ENLIVE / ENSURE PLUS) liquid 237 mL  237 mL Oral BID BM [name], MD   237 mL at 04/20/20 1009   hydrOXYzine (ATARAX/VISTARIL) tablet 50 mg  50 mg Oral TID PRN Clapacs, [name], MD   50 mg at 04/18/20 1720   LORazepam (ATIVAN) tablet 2 mg  2 mg Oral Q6H PRN [name], MD   2 mg at 04/15/20 1659   Or   LORazepam (ATIVAN) injection 2 mg  2 mg Intramuscular Q6H PRN [name], MD       magnesium hydroxide (MILK OF MAGNESIA) suspension 30 mL  30 mL Oral Daily PRN Clapacs, [name], MD       QUEtiapine (SEROQUEL XR) 24 hr tablet 400 mg  400 mg Oral QHS Les Pou M, MD   400 mg at 04/19/20 2118   QUEtiapine (SEROQUEL) tablet 50 mg  50 mg Oral Daily [name], MD   50 mg at 04/20/20 6073   traZODone (DESYREL) tablet 100 mg  100 mg Oral QHS PRN Clapacs, [name], MD   100 mg at 04/15/20 2239    Lab Results: No results found for this or any previous visit (from the past 48 hour(s)).  Blood Alcohol level:  Lab Results  Component Value Date   ETH <10 04/12/2020   ETH <10 03/03/2018    Metabolic Disorder Labs: Lab Results  Component Value Date   HGBA1C 4.7 (L) 04/13/2020   MPG 88.19 04/13/2020   MPG 93.93 03/03/2018   Lab Results  Component Value Date   PROLACTIN 17.5 05/02/2014   Lab Results  Component Value Date   CHOL 122 04/13/2020   TRIG 50 04/13/2020   HDL 48 04/13/2020   CHOLHDL 2.5 04/13/2020   VLDL 10 04/13/2020   LDLCALC 64 04/13/2020   LDLCALC 90 03/03/2018    Physical Findings: AIMS: Facial and Oral Movements Muscles of Facial Expression: None, normal Lips and Perioral Area: None, normal Jaw: None, normal Tongue: None, normal,Extremity Movements Upper (arms, wrists, hands, fingers): None, normal Lower (legs, knees, ankles, toes): None, normal, Trunk Movements Neck,  shoulders, hips: None, normal, Overall Severity Severity of abnormal movements (highest score from questions above): None, normal Incapacitation due to abnormal movements: None, normal Patient's awareness of abnormal movements (rate only patient's report): No Awareness, Dental Status Current problems with teeth and/or dentures?: No  CIWA:  CIWA-Ar Total: 0 COWS:  COWS Total Score: 2  Musculoskeletal: Strength &  Muscle Tone: within normal limits Gait & Station: normal Patient leans: N/A  Psychiatric Specialty Exam:     Blood pressure (!) 95/56, pulse (!) 110, temperature 98.1 F (36.7 C), temperature source Oral, resp. rate 17, height 5\' 11"  (1.803 m), weight 63.5 kg, last menstrual period 03/30/2020, SpO2 97 %.Body mass index is 19.53 kg/m.  General Appearance: Casual  Eye Contact:  Minimal  Speech:  Slow  Volume:  soft  Mood:  "soft of okay."   Affect:  Flat  Thought Process:  Disorganized  Orientation:  Full (Time, Place, and Person)  Thought Content:  Rumination and Tangential  Suicidal Thoughts:  No  Homicidal Thoughts:  No  Memory:  Immediate;   Fair Recent;   Fair Remote;   Fair  Judgement:  Impaired  Insight:  Shallow  Psychomotor Activity:  Decreased  Concentration:  Concentration: Fair  Recall:  13/09/2019 of Knowledge:  Fair  Language:  Fair  Akathisia:  No  Handed:  Right  AIMS (if indicated):     Assets:  Desire for Improvement Housing Physical Health Social Support  ADL's:  Impaired  Cognition:  Impaired,  Mild  Sleep:  Number of Hours: 8.25     Treatment Plan Summary: Daily contact with patient to assess and evaluate symptoms and progress in treatment, Medication management and Plan Tolerating medicine and cooperative with it.  Slightly more organized.  Still confused and mildly paranoid.  No change to medication plan for today.  Supportive counseling and encouragement.  11/26 No changes  12/26, MD 04/20/2020, 12:22 PM

## 2020-04-20 NOTE — Progress Notes (Signed)
Recreation Therapy Notes  Date: 04/20/2020   Time: 9:30 am   Location: Craft room     Behavioral response: N/A   Intervention Topic: Values  Discussion/Intervention: Patient did not attend group.   Clinical Observations/Feedback:  Patient did not attend group.   Robin Hess LRT/CTRS        Robin Hess 04/20/2020 10:44 AM

## 2020-04-21 NOTE — Plan of Care (Signed)
  Problem: Consults Goal: Concurrent Medical Patient Education Description: (See Patient Education Module for education specifics) Outcome: Not Progressing   Problem: [facility] Concurrent Medical Problem Goal: LTG-Pt will be physically stable and he/significant other Description: (Patient will be physically stable and he/significant other will be able to verbalize understanding of follow-up care and symptoms that would warrant further treatment) Outcome: Not Progressing Goal: STG-Vital signs will be within defined limits or stabilized Description: (STG- Vital signs will be within defined limits or stabilized for individual) Outcome: Not Progressing Goal: STG-Compliance with medication and/or treatment as ordered Description: (STG-Compliance with medication and/or treatment as ordered by MD) Outcome: Not Progressing Goal: STG-Verbalize two symptoms that would warrant further Description: (STG-Verbalize two symptoms that would warrant further treatment) Outcome: Not Progressing Goal: STG-Patient will participate in management/stabilization Description: (STG-Patient will participate in management/stabilization of medical condition) Outcome: Not Progressing Goal: STG-Other (Specify): Description: STG-Other Concurrent Medical (Specify): Outcome: Not Progressing   Problem: Education: Goal: Utilization of techniques to improve thought processes will improve Outcome: Not Progressing Goal: Knowledge of the prescribed therapeutic regimen will improve Outcome: Not Progressing   Problem: Activity: Goal: Interest or engagement in leisure activities will improve Outcome: Not Progressing Goal: Imbalance in normal sleep/wake cycle will improve Outcome: Not Progressing   Problem: Coping: Goal: Coping ability will improve Outcome: Not Progressing Goal: Will verbalize feelings Outcome: Not Progressing   Problem: Health Behavior/Discharge Planning: Goal: Ability to make decisions will  improve Outcome: Not Progressing Goal: Compliance with therapeutic regimen will improve Outcome: Not Progressing   Problem: Role Relationship: Goal: Will demonstrate positive changes in social behaviors and relationships Outcome: Not Progressing   Problem: Safety: Goal: Ability to disclose and discuss suicidal ideas will improve Outcome: Not Progressing Goal: Ability to identify and utilize support systems that promote safety will improve Outcome: Not Progressing   Problem: Self-Concept: Goal: Will verbalize positive feelings about self Outcome: Not Progressing Goal: Level of anxiety will decrease Outcome: Not Progressing   

## 2020-04-21 NOTE — Progress Notes (Addendum)
Patient still acting bizarre. Went to sleep early in shift, then awakened later and took her HS medications. Up at 3am, saying "I'm a monster", trying to talk on the phones, after being told they had been turned off for the night, she continued trying to talk on them. Walking on her tiptoes. Runs out of her room every time she hears a door opening. Patient tried to force her way into the nurses station and then sat on the floor when redirected. Given Ativan 2 mg po, Haldol 5 mg po and Benadryl 50 mg po per prn order. Patient was very reluctant to take the medicine and finally agreed to take it if she could use the phone. Allowed patient to use the phone and she took her medication. Patient has very poor hygiene and extremely pungent body odor.

## 2020-04-21 NOTE — Progress Notes (Signed)
D: Pt alert and oriented. Pt denies experiencing any anxiety/depression at this time. Pt denies experiencing any pain. Pt denies experiencing any SI/HI, or AVH at this time, however reports that she is tired and experiencing paranoia.    A: Scheduled medications administered to pt, per MD orders. Support and encouragement provided. Frequent verbal contact made. Routine safety checks conducted q15 minutes.   R: No adverse drug reactions noted. Pt verbally contracts for safety at this time. Pt complaint with medications and treatment plan. Pt interacts well with others on the unit. Pt remains safe at this time. Will continue to monitor.

## 2020-04-21 NOTE — Progress Notes (Addendum)
[facility] MD Progress Note  04/21/2020 9:08 AM Robin Hess  MRN:  [id number] Subjective:  11/27 Patient is agreeable to come into the office.  She appears tired, walks slowly.  Tells me that she did sleep well but did not have any nightmares.  She asked the nurse yesterday about Luvox and Minipress that she was on from home.  Told her that we have held it for now.  She is okay with this.  Reports that she came in because "I went crazy.  I did not know what was reality."  Says that this been going on for "a long time," years according to her.  Reports that she has been severely depressed recently because "I ruin everything."  She is very tired and has her eyes closed during the evaluation. Has never been on Seroquel IR.  She denies auditory and visual hallucinations, however tells me that she is having delusions.  "When I woke up this morning, I thought the world was going to end at 7 AM."  Denies thoughts of suicide.  No new medical issues. The nurse did inform me that she try to elope the unit this morning and received to a B71.  [age] year old female who was seen by Dr. Neale Burly for psychiatric evaluation on April 13, 2020.  She had exhibited erratic and bizarre behaviors, running away from family into the woods.  She is been seeing the therapist for the first time today.  Patient indicates that she is "sort of" feeling better today.  Indicates that "everything is so surreal.  I feel like my body is not my own.  Like him being controlled."  She denies auditory and visual hallucinations.  She did sleep well last night.  Appetite is fair.  Reports mild dizziness.  Patient happy birthday today.  Principal Problem: Psychosis (HCC) Diagnosis: Principal Problem:   Psychosis (HCC) Active Problems:   Social anxiety disorder   OCD (obsessive compulsive disorder)  Total Time spent with patient: 30 minutes  Past Psychiatric History: Patient has a past history of anxiety disorder and OCD with  recently more development of psychotic-like symptoms  Past Medical History:  Past Medical History:  Diagnosis Date  . Allergy   . Anxiety   . Cannabis use disorder, moderate, dependence (HCC)   . Depression   . OCD (obsessive compulsive disorder)   . Severe major depression, single episode, with psychotic features (HCC)   . Social anxiety disorder    History reviewed. No pertinent surgical history. Family History: History reviewed. No pertinent family history. Family Psychiatric  History: See previous Social History:  Social History   Substance and Sexual Activity  Alcohol Use No     Social History   Substance and Sexual Activity  Drug Use Yes  . Types: Marijuana    Social History   Socioeconomic History  . Marital status: Single    Spouse name: Not on file  . Number of children: Not on file  . Years of education: Not on file  . Highest education level: Not on file  Occupational History  . Not on file  Tobacco Use  . Smoking status: Never Smoker  . Smokeless tobacco: Never Used  Substance and Sexual Activity  . Alcohol use: No  . Drug use: Yes    Types: Marijuana  . Sexual activity: Never    Birth control/protection: None  Other Topics Concern  . Not on file  Social History Narrative  . Not on file   Social Determinants of  Health   Financial Resource Strain:   . Difficulty of Paying Living Expenses: Not on file  Food Insecurity:   . Worried About Programme researcher, broadcasting/film/video in the Last Year: Not on file  . Ran Out of Food in the Last Year: Not on file  Transportation Needs:   . Lack of Transportation (Medical): Not on file  . Lack of Transportation (Non-Medical): Not on file  Physical Activity:   . Days of Exercise per Week: Not on file  . Minutes of Exercise per Session: Not on file  Stress:   . Feeling of Stress : Not on file  Social Connections:   . Frequency of Communication with Friends and Family: Not on file  . Frequency of Social Gatherings with  Friends and Family: Not on file  . Attends Religious Services: Not on file  . Active Member of Clubs or Organizations: Not on file  . Attends Banker Meetings: Not on file  . Marital Status: Not on file   Additional Social History:                         Sleep: Good  Appetite:  Fair  Current Medications: Current Facility-Administered Medications  Medication Dose Route Frequency Provider Last Rate Last Admin  . acetaminophen (TYLENOL) tablet 650 mg  650 mg Oral Q6H PRN Clapacs, [name], MD   650 mg at 04/18/20 0902  . alum & mag hydroxide-simeth (MAALOX/MYLANTA) 200-200-20 MG/5ML suspension 30 mL  30 mL Oral Q4H PRN Clapacs, [name], MD      . chlorproMAZINE (THORAZINE) injection 25 mg  25 mg Intramuscular TID PRN [name], MD      . haloperidol (HALDOL) tablet 5 mg  5 mg Oral Q8H PRN [name], MD   5 mg at 04/21/20 0533   And  . LORazepam (ATIVAN) tablet 2 mg  2 mg Oral Q8H PRN [name], MD   2 mg at 04/21/20 0533   And  . diphenhydrAMINE (BENADRYL) capsule 50 mg  50 mg Oral Q8H PRN [name], MD   50 mg at 04/21/20 0533  . feeding supplement (ENSURE ENLIVE / ENSURE PLUS) liquid 237 mL  237 mL Oral BID BM [name], MD   237 mL at 04/20/20 1426  . hydrOXYzine (ATARAX/VISTARIL) tablet 50 mg  50 mg Oral TID PRN Clapacs, [name], MD   50 mg at 04/18/20 1720  . LORazepam (ATIVAN) tablet 2 mg  2 mg Oral Q6H PRN [name], MD   2 mg at 04/15/20 1659   Or  . LORazepam (ATIVAN) injection 2 mg  2 mg Intramuscular Q6H PRN [name], MD      . magnesium hydroxide (MILK OF MAGNESIA) suspension 30 mL  30 mL Oral Daily PRN Clapacs, [name], MD      . QUEtiapine (SEROQUEL XR) 24 hr tablet 400 mg  400 mg Oral QHS [name], MD   400 mg at 04/20/20 2103  . QUEtiapine (SEROQUEL) tablet 50 mg  50 mg Oral Daily [name], MD   50 mg at 04/21/20 0820  . traZODone (DESYREL) tablet 100 mg  100 mg Oral QHS PRN Clapacs, [name], MD   100 mg at 04/15/20 2239    Lab Results: No results found for this or any previous visit (from the past 48 hour(s)).  Blood Alcohol level:  Lab Results  Component  Value Date   ETH <10 04/12/2020   ETH <10 03/03/2018    Metabolic Disorder Labs: Lab Results  Component Value Date   HGBA1C 4.7 (L) 04/13/2020   MPG 88.19 04/13/2020   MPG 93.93 03/03/2018   Lab Results  Component Value Date   PROLACTIN 17.5 05/02/2014   Lab Results  Component Value Date   CHOL 122 04/13/2020   TRIG 50 04/13/2020   HDL 48 04/13/2020   CHOLHDL 2.5 04/13/2020   VLDL 10 04/13/2020   LDLCALC 64 04/13/2020   LDLCALC 90 03/03/2018    Physical Findings: AIMS: Facial and Oral Movements Muscles of Facial Expression: None, normal Lips and Perioral Area: None, normal Jaw: None, normal Tongue: None, normal,Extremity Movements Upper (arms, wrists, hands, fingers): None, normal Lower (legs, knees, ankles, toes): None, normal, Trunk Movements Neck, shoulders, hips: None, normal, Overall Severity Severity of abnormal movements (highest score from questions above): None, normal Incapacitation due to abnormal movements: None, normal Patient's awareness of abnormal movements (rate only patient's report): No Awareness, Dental Status Current problems with teeth and/or dentures?: No  CIWA:  CIWA-Ar Total: 0 COWS:  COWS Total Score: 2  Musculoskeletal: Strength & Muscle Tone: within normal limits Gait & Station: normal Patient leans: N/A  Psychiatric Specialty Exam:     Blood pressure 104/69, pulse (!) 103, temperature 97.9 F (36.6 C), temperature source Oral, resp. rate 17, height 5\' 11"  (1.803 m), weight 63.5 kg, last menstrual period 03/30/2020, SpO2 100 %.Body mass index is 19.53 kg/m.  General Appearance: Casual  Eye Contact:  poor  Speech:  Slow  Volume:  soft  Mood: "depressed."  Affect:  Flat  Thought Process:  Disorganized  Orientation:  Full (Time, Place, and Person)  Thought  Content:  Rumination and Tangential  Suicidal Thoughts:  No  Homicidal Thoughts:  No  Memory:  Immediate;   Fair Recent;   Fair Remote;   Fair  Judgement:  Impaired  Insight:  Shallow  Psychomotor Activity:  Decreased  Concentration:  Concentration: Fair  Recall:  13/09/2019 of Knowledge:  Fair  Language:  Fair  Akathisia:  No  Handed:  Right  AIMS (if indicated):     Assets:  Desire for Improvement Housing Physical Health Social Support  ADL's:  Impaired  Cognition:  Impaired,  Mild  Sleep:  Number of Hours: 6.5     Treatment Plan Summary: Daily contact with patient to assess and evaluate symptoms and progress in treatment, Medication management and Plan Tolerating medicine and cooperative with it.  Slightly more organized.  Still confused and mildly paranoid.  No change to medication plan for today.  Supportive counseling and encouragement.  11/26 No changes  11/27 No changes  12/27, MD 04/21/2020, 9:08 AM

## 2020-04-21 NOTE — Progress Notes (Signed)
Patient got into the shower with much encouragement. She got in twice with her clothes on and came out with wet clothes, continuing to have body odor. Instructed to take off her clothes, get into the shower, wash under her arms, dry off, put on deodorant and get dressed in clean scrubs. Patient rang the call light in the bathroom and was standing there wet with a towel wrapped around her asking "did I do it right?". Patient dressed and given list of medications she was on before admission and while she is here. She expressed concern that she is not taking Luvox and Prazosin, which she says she needs, and was prescribed prior to admit

## 2020-04-22 NOTE — Progress Notes (Signed)
Patient did come up and ask for her medication because she was feeling confused. This Clinical research associate administered patient's scheduled Seroquel. Patient tolerated medication well, without any issues.

## 2020-04-22 NOTE — Plan of Care (Addendum)
D- Patient alert and oriented. Patient presented to be very drowsy this morning, stating that she was feeling dizzy, however, she stated that she slept ok. Patient endorsed passive SI because "I'm missing my family", and when asked if she felt safe on the unit, she stated "no". This Clinical research associate also asked patient if she could contract for safety and she stated "maybe". Patient endorsed both depression and anxiety, continuing to stated that "because I'm not at home", is making her feel this way. Patient reported that she is hallucinating, stating that she is having "delusions of everything, I'm not sure, I don't know what's real or fake". Patient denied HI at this time. Patient's goal for today is "to do more things", in which she will "not sleep a lot", in order to achieve her goal. Patient did sleep majority of the morning, but has been up since this afternoon. Patient also stated on her self-inventory that "I need help with a discharge plan, and also I am sorry".   A- Scheduled medications administered to patient, per MD orders. Support and encouragement provided.  Routine safety checks conducted every 15 minutes.  Patient informed to notify staff with problems or concerns.  R- No adverse drug reactions noted. Patient contracts for safety at this time. Patient compliant with medications. Patient receptive, calm, and cooperative. Patient remains safe at this time.  Problem: Consults Goal: Concurrent Medical Patient Education Description: (See Patient Education Module for education specifics) Outcome: Not Progressing   Problem: [facility] Concurrent Medical Problem Goal: LTG-Pt will be physically stable and he/significant other Description: (Patient will be physically stable and he/significant other will be able to verbalize understanding of follow-up care and symptoms that would warrant further treatment) Outcome: Not Progressing Goal: STG-Vital signs will be within defined limits or stabilized Description:  (STG- Vital signs will be within defined limits or stabilized for individual) Outcome: Not Progressing Goal: STG-Compliance with medication and/or treatment as ordered Description: (STG-Compliance with medication and/or treatment as ordered by MD) Outcome: Not Progressing Goal: STG-Verbalize two symptoms that would warrant further Description: (STG-Verbalize two symptoms that would warrant further treatment) Outcome: Not Progressing Goal: STG-Patient will participate in management/stabilization Description: (STG-Patient will participate in management/stabilization of medical condition) Outcome: Not Progressing Goal: STG-Other (Specify): Description: STG-Other Concurrent Medical (Specify): Outcome: Not Progressing   Problem: Education: Goal: Utilization of techniques to improve thought processes will improve Outcome: Not Progressing Goal: Knowledge of the prescribed therapeutic regimen will improve Outcome: Not Progressing   Problem: Activity: Goal: Interest or engagement in leisure activities will improve Outcome: Not Progressing Goal: Imbalance in normal sleep/wake cycle will improve Outcome: Not Progressing   Problem: Coping: Goal: Coping ability will improve Outcome: Not Progressing Goal: Will verbalize feelings Outcome: Not Progressing   Problem: Health Behavior/Discharge Planning: Goal: Ability to make decisions will improve Outcome: Not Progressing Goal: Compliance with therapeutic regimen will improve Outcome: Not Progressing   Problem: Role Relationship: Goal: Will demonstrate positive changes in social behaviors and relationships Outcome: Not Progressing   Problem: Safety: Goal: Ability to disclose and discuss suicidal ideas will improve Outcome: Not Progressing Goal: Ability to identify and utilize support systems that promote safety will improve Outcome: Not Progressing   Problem: Self-Concept: Goal: Will verbalize positive feelings about self Outcome:  Not Progressing Goal: Level of anxiety will decrease Outcome: Not Progressing

## 2020-04-22 NOTE — Progress Notes (Signed)
Patient has come out of her room, twice, trying to get out of the exit door on the blue hallway. She is also messing with the fire alarm pull station. Patient then started walking around the nurses station and opened the door to the seclusion room. Staff went up to patient to redirect her that this was not her room. Patient stated that she wasn't sure where her room is. Staff walked with patient back to her room.

## 2020-04-22 NOTE — [facility] Group Notes (Signed)
[facility] Group Notes: (Clinical Social Work)   04/22/2020      Type of Therapy:  Group Therapy   Participation Level:  Did Not Attend - was invited individually by Nurse/MHT and chose not to attend.   Susa Simmonds, LCSWA 04/22/2020  4:02 PM

## 2020-04-22 NOTE — Progress Notes (Signed)
Patient has remained isolative to room. Hygiene is improved. Took her 8 pm Seroquel and has slept since then. Quiet-not talking or interacting with patients or staff. Appears internally preoccupied

## 2020-04-22 NOTE — Progress Notes (Signed)
Patient refused to take her scheduled morning medication, stating that she was feeling dizzy and would take it later. Patient's vitals were obtained and they were all WNL. Patient continues to rest in bed. MD will be notified.

## 2020-04-22 NOTE — Plan of Care (Signed)
  Problem: Consults Goal: Concurrent Medical Patient Education Description: (See Patient Education Module for education specifics) Outcome: Progressing   Problem: [facility] Concurrent Medical Problem Goal: LTG-Pt will be physically stable and he/significant other Description: (Patient will be physically stable and he/significant other will be able to verbalize understanding of follow-up care and symptoms that would warrant further treatment) Outcome: Progressing Goal: STG-Vital signs will be within defined limits or stabilized Description: (STG- Vital signs will be within defined limits or stabilized for individual) Outcome: Progressing Goal: STG-Compliance with medication and/or treatment as ordered Description: (STG-Compliance with medication and/or treatment as ordered by MD) Outcome: Progressing Goal: STG-Verbalize two symptoms that would warrant further Description: (STG-Verbalize two symptoms that would warrant further treatment) Outcome: Progressing Goal: STG-Patient will participate in management/stabilization Description: (STG-Patient will participate in management/stabilization of medical condition) Outcome: Progressing Goal: STG-Other (Specify): Description: STG-Other Concurrent Medical (Specify): Outcome: Progressing   Problem: Education: Goal: Utilization of techniques to improve thought processes will improve Outcome: Progressing Goal: Knowledge of the prescribed therapeutic regimen will improve Outcome: Progressing   Problem: Activity: Goal: Interest or engagement in leisure activities will improve Outcome: Progressing Goal: Imbalance in normal sleep/wake cycle will improve Outcome: Progressing   Problem: Coping: Goal: Coping ability will improve Outcome: Progressing Goal: Will verbalize feelings Outcome: Progressing   Problem: Health Behavior/Discharge Planning: Goal: Ability to make decisions will improve Outcome: Progressing Goal: Compliance with therapeutic  regimen will improve Outcome: Progressing   Problem: Role Relationship: Goal: Will demonstrate positive changes in social behaviors and relationships Outcome: Progressing   Problem: Safety: Goal: Ability to disclose and discuss suicidal ideas will improve Outcome: Progressing Goal: Ability to identify and utilize support systems that promote safety will improve Outcome: Progressing   Problem: Self-Concept: Goal: Will verbalize positive feelings about self Outcome: Progressing Goal: Level of anxiety will decrease Outcome: Progressing   

## 2020-04-22 NOTE — Progress Notes (Signed)
[facility] MD Progress Note  04/22/2020 9:32 AM Robin Hess  MRN:  [id number] Subjective:  11/28 Patient speaks with her eyes closed.  She has some delays when speaking.  Reports severe paranoia about "everything."  Reports low auditory hallucinations that tell her that she still "ruins everything."  "I hate myself."  She reports passive thoughts of suicide today with no plan, intent, drive or preparation..  Reports mild dizziness.  Daytime sedation is appreciated.  Reports sleeping well last night.  She is taking her medications as prescribed.  Overall mood is "I do not know."  11/27 Patient is agreeable to come into the office.  She appears tired, walks slowly.  Tells me that she did sleep well but did not have any nightmares.  She asked the nurse yesterday about Luvox and Minipress that she was on from home.  Told her that we have held it for now.  She is okay with this.  Reports that she came in because "I went crazy.  I did not know what was reality."  Says that this been going on for "a long time," years according to her.  Reports that she has been severely depressed recently because "I ruin everything."  She is very tired and has her eyes closed during the evaluation. Has never been on Seroquel IR.  She denies auditory and visual hallucinations, however tells me that she is having delusions.  "When I woke up this morning, I thought the world was going to end at 7 AM."  Denies thoughts of suicide.  No new medical issues. The nurse did inform me that she try to elope the unit this morning and received to a B39.  3/35 19 year old female who was seen by Dr. Neale Burly for psychiatric evaluation on April 13, 2020.  She had exhibited erratic and bizarre behaviors, running away from family into the woods.  She is been seeing the therapist for the first time today.  Patient indicates that she is "sort of" feeling better today.  Indicates that "everything is so surreal.  I feel like my body is not my own.   Like him being controlled."  She denies auditory and visual hallucinations.  She did sleep well last night.  Appetite is fair.  Reports mild dizziness.  Patient happy birthday today.  Principal Problem: Psychosis (HCC) Diagnosis: Principal Problem:   Psychosis (HCC) Active Problems:   Social anxiety disorder   OCD (obsessive compulsive disorder)  Total Time spent with patient: 30 minutes  Past Psychiatric History: Patient has a past history of anxiety disorder and OCD with recently more development of psychotic-like symptoms  Past Medical History:  Past Medical History:  Diagnosis Date  . Allergy   . Anxiety   . Cannabis use disorder, moderate, dependence (HCC)   . Depression   . OCD (obsessive compulsive disorder)   . Severe major depression, single episode, with psychotic features (HCC)   . Social anxiety disorder    History reviewed. No pertinent surgical history. Family History: History reviewed. No pertinent family history. Family Psychiatric  History: See previous Social History:  Social History   Substance and Sexual Activity  Alcohol Use No     Social History   Substance and Sexual Activity  Drug Use Yes  . Types: Marijuana    Social History   Socioeconomic History  . Marital status: Single    Spouse name: Not on file  . Number of children: Not on file  . Years of education: Not on file  .  Highest education level: Not on file  Occupational History  . Not on file  Tobacco Use  . Smoking status: Never Smoker  . Smokeless tobacco: Never Used  Substance and Sexual Activity  . Alcohol use: No  . Drug use: Yes    Types: Marijuana  . Sexual activity: Never    Birth control/protection: None  Other Topics Concern  . Not on file  Social History Narrative  . Not on file   Social Determinants of Health   Financial Resource Strain:   . Difficulty of Paying Living Expenses: Not on file  Food Insecurity:   . Worried About Programme researcher, broadcasting/film/video in the Last  Year: Not on file  . Ran Out of Food in the Last Year: Not on file  Transportation Needs:   . Lack of Transportation (Medical): Not on file  . Lack of Transportation (Non-Medical): Not on file  Physical Activity:   . Days of Exercise per Week: Not on file  . Minutes of Exercise per Session: Not on file  Stress:   . Feeling of Stress : Not on file  Social Connections:   . Frequency of Communication with Friends and Family: Not on file  . Frequency of Social Gatherings with Friends and Family: Not on file  . Attends Religious Services: Not on file  . Active Member of Clubs or Organizations: Not on file  . Attends Banker Meetings: Not on file  . Marital Status: Not on file   Additional Social History:                         Sleep: Good  Appetite:  Fair  Current Medications: Current Facility-Administered Medications  Medication Dose Route Frequency Provider Last Rate Last Admin  . acetaminophen (TYLENOL) tablet 650 mg  650 mg Oral Q6H PRN Clapacs, [name], MD   650 mg at 04/18/20 0902  . alum & mag hydroxide-simeth (MAALOX/MYLANTA) 200-200-20 MG/5ML suspension 30 mL  30 mL Oral Q4H PRN Clapacs, [name], MD      . chlorproMAZINE (THORAZINE) injection 25 mg  25 mg Intramuscular TID PRN [name], MD      . haloperidol (HALDOL) tablet 5 mg  5 mg Oral Q8H PRN [name], MD   5 mg at 04/21/20 0533   And  . LORazepam (ATIVAN) tablet 2 mg  2 mg Oral Q8H PRN [name], MD   2 mg at 04/21/20 0533   And  . diphenhydrAMINE (BENADRYL) capsule 50 mg  50 mg Oral Q8H PRN [name], MD   50 mg at 04/21/20 0533  . feeding supplement (ENSURE ENLIVE / ENSURE PLUS) liquid 237 mL  237 mL Oral BID BM [name], MD   237 mL at 04/21/20 1626  . hydrOXYzine (ATARAX/VISTARIL) tablet 50 mg  50 mg Oral TID PRN Clapacs, [name], MD   50 mg at 04/18/20 1720  . LORazepam (ATIVAN) tablet 2 mg  2 mg Oral Q6H PRN [name], MD   2 mg at 04/15/20 1659    Or  . LORazepam (ATIVAN) injection 2 mg  2 mg Intramuscular Q6H PRN [name], MD      . magnesium hydroxide (MILK OF MAGNESIA) suspension 30 mL  30 mL Oral Daily PRN Clapacs, [name], MD      . QUEtiapine (SEROQUEL XR) 24 hr tablet 400 mg  400 mg Oral QHS Les Pou M,  MD   400 mg at 04/21/20 1952  . QUEtiapine (SEROQUEL) tablet 50 mg  50 mg Oral Daily [name], MD   50 mg at 04/21/20 0820  . traZODone (DESYREL) tablet 100 mg  100 mg Oral QHS PRN Clapacs, [name], MD   100 mg at 04/15/20 2239    Lab Results: No results found for this or any previous visit (from the past 48 hour(s)).  Blood Alcohol level:  Lab Results  Component Value Date   ETH <10 04/12/2020   ETH <10 03/03/2018    Metabolic Disorder Labs: Lab Results  Component Value Date   HGBA1C 4.7 (L) 04/13/2020   MPG 88.19 04/13/2020   MPG 93.93 03/03/2018   Lab Results  Component Value Date   PROLACTIN 17.5 05/02/2014   Lab Results  Component Value Date   CHOL 122 04/13/2020   TRIG 50 04/13/2020   HDL 48 04/13/2020   CHOLHDL 2.5 04/13/2020   VLDL 10 04/13/2020   LDLCALC 64 04/13/2020   LDLCALC 90 03/03/2018    Physical Findings: AIMS: Facial and Oral Movements Muscles of Facial Expression: None, normal Lips and Perioral Area: None, normal Jaw: None, normal Tongue: None, normal,Extremity Movements Upper (arms, wrists, hands, fingers): None, normal Lower (legs, knees, ankles, toes): None, normal, Trunk Movements Neck, shoulders, hips: None, normal, Overall Severity Severity of abnormal movements (highest score from questions above): None, normal Incapacitation due to abnormal movements: None, normal Patient's awareness of abnormal movements (rate only patient's report): No Awareness, Dental Status Current problems with teeth and/or dentures?: No  CIWA:  CIWA-Ar Total: 0 COWS:  COWS Total Score: 2  Musculoskeletal: Strength & Muscle Tone: within normal limits Gait & Station:  normal Patient leans: N/A  Psychiatric Specialty Exam:     Blood pressure 103/67, pulse 84, temperature 99 F (37.2 C), temperature source Oral, resp. rate 18, height 5\' 11"  (1.803 m), weight 63.5 kg, last menstrual period 03/30/2020, SpO2 99 %.Body mass index is 19.53 kg/m.  General Appearance: Casual  Eye Contact:  poor  Speech:  Slow  Volume:  soft  Mood: "I do not know."  Affect:  Flat  Thought Process:  Disorganized  Orientation:  Full (Time, Place, and Person)  Thought Content:  Rumination and Tangential  Suicidal Thoughts:  No  Homicidal Thoughts:  No  Memory:  Immediate;   Fair Recent;   Fair Remote;   Fair  Judgement:  Impaired  Insight:  Shallow  Psychomotor Activity:  Decreased  Concentration:  Concentration: Fair  Recall:  13/09/2019 of Knowledge:  Fair  Language:  Fair  Akathisia:  No  Handed:  Right  AIMS (if indicated):     Assets:  Desire for Improvement Housing Physical Health Social Support  ADL's:  Impaired  Cognition:  Impaired,  Mild  Sleep:  Number of Hours: 9     Treatment Plan Summary: Daily contact with patient to assess and evaluate symptoms and progress in treatment, Medication management and Plan Tolerating medicine and cooperative with it.  Slightly more organized.  Still confused and mildly paranoid.  No change to medication plan for today.  Supportive counseling and encouragement.  11/26 No changes  11/27 No changes  11/28 Patient asks about her home medications of Luvox and Lamictal.  Informed her that these were held on admission. Monitor dizziness Monitor for excessive daytime sedation  12/28, MD 04/22/2020, 9:32 AM

## 2020-04-23 DIAGNOSIS — F23 Brief psychotic disorder: Secondary | ICD-10-CM | POA: Diagnosis not present

## 2020-04-23 MED ORDER — SALINE SPRAY 0.65 % NA SOLN
2.0000 | NASAL | Status: DC | PRN
  Filled 2020-04-23: qty 44

## 2020-04-23 MED ORDER — QUETIAPINE FUMARATE ER 300 MG PO TB24
300.0000 mg | ORAL_TABLET | Freq: Every day | ORAL | Status: DC
  Administered 2020-04-23: 300 mg via ORAL
  Filled 2020-04-23: qty 1

## 2020-04-23 MED ORDER — RISPERIDONE 1 MG PO TABS
2.0000 mg | ORAL_TABLET | Freq: Every day | ORAL | Status: DC
  Administered 2020-04-23 – 2020-04-25 (×3): 2 mg via ORAL
  Filled 2020-04-23 (×3): qty 2

## 2020-04-23 NOTE — Tx Team (Signed)
Interdisciplinary Treatment and Diagnostic Plan Update  04/23/2020 Time of Session: 8:30AM Robin Hess MRN: [id number]  Principal Diagnosis: Psychosis [facility])  Secondary Diagnoses: Principal Problem:   Psychosis (HCC) Active Problems:   Social anxiety disorder   OCD (obsessive compulsive disorder)   Current Medications:  Current Facility-Administered Medications  Medication Dose Route Frequency Provider Last Rate Last Admin  . acetaminophen (TYLENOL) tablet 650 mg  650 mg Oral Q6H PRN Clapacs, [name], MD   650 mg at 04/18/20 0902  . alum & mag hydroxide-simeth (MAALOX/MYLANTA) 200-200-20 MG/5ML suspension 30 mL  30 mL Oral Q4H PRN Clapacs, [name], MD      . chlorproMAZINE (THORAZINE) injection 25 mg  25 mg Intramuscular TID PRN [name], MD      . haloperidol (HALDOL) tablet 5 mg  5 mg Oral Q8H PRN [name], MD   5 mg at 04/21/20 0533   And  . LORazepam (ATIVAN) tablet 2 mg  2 mg Oral Q8H PRN [name], MD   2 mg at 04/21/20 0533   And  . diphenhydrAMINE (BENADRYL) capsule 50 mg  50 mg Oral Q8H PRN [name], MD   50 mg at 04/21/20 0533  . feeding supplement (ENSURE ENLIVE / ENSURE PLUS) liquid 237 mL  237 mL Oral BID BM [name], MD   237 mL at 04/22/20 1015  . hydrOXYzine (ATARAX/VISTARIL) tablet 50 mg  50 mg Oral TID PRN Clapacs, [name], MD   50 mg at 04/18/20 1720  . LORazepam (ATIVAN) tablet 2 mg  2 mg Oral Q6H PRN [name], MD   2 mg at 04/15/20 1659   Or  . LORazepam (ATIVAN) injection 2 mg  2 mg Intramuscular Q6H PRN [name], MD      . magnesium hydroxide (MILK OF MAGNESIA) suspension 30 mL  30 mL Oral Daily PRN Clapacs, [name], MD      . QUEtiapine (SEROQUEL XR) 24 hr tablet 400 mg  400 mg Oral QHS [name], MD   400 mg at 04/22/20 1953  . QUEtiapine (SEROQUEL) tablet 50 mg  50 mg Oral Daily [name], MD   50 mg at 04/23/20 0819  . traZODone (DESYREL) tablet 100 mg  100 mg Oral QHS PRN Clapacs, [name],  MD   100 mg at 04/15/20 2239   PTA Medications: Medications Prior to Admission  Medication Sig Dispense Refill Last Dose  . fluvoxaMINE (LUVOX) 100 MG tablet Take 1 tablet (100 mg total) by mouth at bedtime. (Patient not taking: Reported on 04/12/2020) 30 tablet 1   . hydrOXYzine (ATARAX/VISTARIL) 50 MG tablet Take 1 tablet (50 mg total) by mouth 3 (three) times daily as needed for anxiety. (Patient not taking: Reported on 04/12/2020) 90 tablet 1   . lamoTRIgine (LAMICTAL) 25 MG tablet Take 1 tablet (25 mg total) by mouth at bedtime. Take 1 tab for two weeks, 2 tabs for 2 weeks, 4 tabs for 2 weeks, 8 tabs or 200 mg after (Patient not taking: Reported on 04/12/2020) 45 tablet 1   . prazosin (MINIPRESS) 1 MG capsule Take 1 capsule (1 mg total) by mouth 2 (two) times daily. (Patient not taking: Reported on 04/12/2020) 60 capsule 1   . traZODone (DESYREL) 100 MG tablet Take 1 tablet (100 mg total) by mouth at bedtime as needed for sleep. (Patient not taking: Reported on 04/12/2020) 30 tablet 1     Patient Stressors:  Patient Strengths:    Treatment Modalities: Medication Management, Group therapy, Case management,  1 to 1 session with clinician, Psychoeducation, Recreational therapy.   Physician Treatment Plan for Primary Diagnosis: Psychosis (HCC) Long Term Goal(s): Improvement in symptoms so as ready for discharge Improvement in symptoms so as ready for discharge   Short Term Goals: Ability to identify changes in lifestyle to reduce recurrence of condition will improve Ability to verbalize feelings will improve Ability to disclose and discuss suicidal ideas Ability to demonstrate self-control will improve Ability to identify and develop effective coping behaviors will improve Compliance with prescribed medications will improve Ability to identify changes in lifestyle to reduce recurrence of condition will improve Ability to verbalize feelings will improve Ability to disclose and  discuss suicidal ideas Ability to demonstrate self-control will improve Ability to identify and develop effective coping behaviors will improve Compliance with prescribed medications will improve  Medication Management: Evaluate patient's response, side effects, and tolerance of medication regimen.  Therapeutic Interventions: 1 to 1 sessions, Unit Group sessions and Medication administration.  Evaluation of Outcomes: Not Progressing  Physician Treatment Plan for Secondary Diagnosis: Principal Problem:   Psychosis (HCC) Active Problems:   Social anxiety disorder   OCD (obsessive compulsive disorder)  Long Term Goal(s): Improvement in symptoms so as ready for discharge Improvement in symptoms so as ready for discharge   Short Term Goals: Ability to identify changes in lifestyle to reduce recurrence of condition will improve Ability to verbalize feelings will improve Ability to disclose and discuss suicidal ideas Ability to demonstrate self-control will improve Ability to identify and develop effective coping behaviors will improve Compliance with prescribed medications will improve Ability to identify changes in lifestyle to reduce recurrence of condition will improve Ability to verbalize feelings will improve Ability to disclose and discuss suicidal ideas Ability to demonstrate self-control will improve Ability to identify and develop effective coping behaviors will improve Compliance with prescribed medications will improve     Medication Management: Evaluate patient's response, side effects, and tolerance of medication regimen.  Therapeutic Interventions: 1 to 1 sessions, Unit Group sessions and Medication administration.  Evaluation of Outcomes: Not Progressing   RN Treatment Plan for Primary Diagnosis: Psychosis (HCC) Long Term Goal(s): Knowledge of disease and therapeutic regimen to maintain health will improve  Short Term Goals: Ability to verbalize frustration and  anger appropriately will improve, Ability to participate in decision making will improve, Ability to verbalize feelings will improve, Ability to identify and develop effective coping behaviors will improve and Compliance with prescribed medications will improve  Medication Management: RN will administer medications as ordered by provider, will assess and evaluate patient's response and provide education to patient for prescribed medication. RN will report any adverse and/or side effects to prescribing provider.  Therapeutic Interventions: 1 on 1 counseling sessions, Psychoeducation, Medication administration, Evaluate responses to treatment, Monitor vital signs and CBGs as ordered, Perform/monitor CIWA, COWS, AIMS and Fall Risk screenings as ordered, Perform wound care treatments as ordered.  Evaluation of Outcomes: Not Progressing   LCSW Treatment Plan for Primary Diagnosis: Psychosis (HCC) Long Term Goal(s): Safe transition to appropriate next level of care at discharge, Engage patient in therapeutic group addressing interpersonal concerns.  Short Term Goals: Engage patient in aftercare planning with referrals and resources, Increase social support, Increase ability to appropriately verbalize feelings, Facilitate acceptance of mental health diagnosis and concerns, Identify triggers associated with mental health/substance abuse issues and Increase skills for wellness and recovery  Therapeutic Interventions: Assess for all discharge  needs, 1 to 1 time with Child psychotherapist, Explore available resources and support systems, Assess for adequacy in community support network, Educate family and significant other(s) on suicide prevention, Complete Psychosocial Assessment, Interpersonal group therapy.  Evaluation of Outcomes: Not Progressing   Progress in Treatment: Attending groups: No. Participating in groups: No. Taking medication as prescribed: Yes. Toleration medication: Yes. Family/Significant  other contact made: Yes, individual(s) contacted:  CSW made contact with father Patient understands diagnosis: No. Discussing patient identified problems/goals with staff: No. Medical problems stabilized or resolved: Yes. Denies suicidal/homicidal ideation: Yes. Issues/concerns per patient self-inventory: No. Other: None.  New problem(s) identified: No, Describe:  None. Update (04/18/20) no changes.  New Short Term/Long Term Goal(s): elimination of symptoms of psychosis, medication management for mood stabilization; elimination of SI thoughts; development of comprehensive mental wellness/sobriety plan. Update (04/18/20) Patient was able to have brief (30 minutes) conversation with physician. Update 04/23/20: No changes at this time.  Patient Goals: "Work on myself." "Update (04/18/20) nothing significant to report. Update 04/23/20: No changes at this time.  Discharge Plan or Barriers: CSW will assist with aftercare planning as needed. Update 04/23/20: No changes at this time.  Reason for Continuation of Hospitalization: Anxiety Delusions  Depression Hallucinations Medication stabilization  Estimated Length of Stay: TBD  Attendees: Patient:  04/23/2020 9:23 AM  Physician: Les Pou, MD 04/23/2020 9:23 AM  Nursing: 04/23/2020 9:23 AM  RN Care Manager: 04/23/2020 9:23 AM  Social Worker: Penni Homans, MSW, LCSW 04/23/2020 9:23 AM  Recreational Therapist: 04/23/2020 9:23 AM  Other: Jairo Ben, MSW 04/23/2020 9:23 AM  Other: Vilma Meckel. Algis Greenhouse, MSW, [place], LCAS 04/23/2020 9:23 AM  Other: 04/23/2020 9:23 AM    Scribe for Treatment Team: Glenis Smoker, LCSW 04/23/2020 9:23 AM

## 2020-04-23 NOTE — Progress Notes (Signed)
[facility]  04/23/2020 3:10 PM Robin Hess  MRN:  [id number]   Subjective:  19 year old woman brought into the hospital under IVC Patient was brought in by EMS after running away from family running out through the woods with bizarre talking behavior. Reported by EMS that the patient was muttering to herself about being guilty of something or of having killed someone.  Yesterday afternoon and through today patient has remained very paranoid and anxious. She states that she is unsure why she hasn't been arrested for all the crimes she has committed. She continues to believe she has started an online human trafficking ring, and has killed numerous people. She feels she should kill herself before she harms more innocent people. She continues to respond to internal stimuli, and have periods of confusion. She has difficulty concentrating and processing information with current level of psychosis. She initially requests to be put on Luvox, then later finds me during the day to ask me not to start medication. Discussed current diagnosis, current medications, and plan of care with patient numerous times throughout the day. Nursing staff has provided her with printed sheets of current medications and doses. Seroquel seems ineffective at this time. Will decrease nighttime dose of Seroquel to 300 mg, and start Risperdal 2 mg QHS to see if this clears psychosis better.    Principal Problem: Psychosis (HCC) Diagnosis: Principal Problem:   Psychosis (HCC) Active Problems:   Social anxiety disorder   OCD (obsessive compulsive disorder)  Total Time spent with patient: 1 hour  Past Psychiatric History: Patient has had 2 prior hospitalizations in 2015 and 2019. Last diagnosis was depression and OCD. Did not follow-up with outpatient treatment. Not currently getting any outpatient treatment. Has made suicidal statements in the past. Recently had not been trying to hurt her self or showing any  violence to others. Some history of cannabis use in the past but currently does not seem to be an active issue.  Past Medical History:  Past Medical History:  Diagnosis Date  . Allergy   . Anxiety   . Cannabis use disorder, moderate, dependence (HCC)   . Depression   . OCD (obsessive compulsive disorder)   . Severe major depression, single episode, with psychotic features (HCC)   . Social anxiety disorder    History reviewed. No pertinent surgical history. Family History: History reviewed. No pertinent family history. Family Psychiatric  History: None reported Social History:  Social History   Substance and Sexual Activity  Alcohol Use No     Social History   Substance and Sexual Activity  Drug Use Yes  . Types: Marijuana    Social History   Socioeconomic History  . Marital status: Single    Spouse name: Not on file  . Number of children: Not on file  . Years of education: Not on file  . Highest education level: Not on file  Occupational History  . Not on file  Tobacco Use  . Smoking status: Never Smoker  . Smokeless tobacco: Never Used  Substance and Sexual Activity  . Alcohol use: No  . Drug use: Yes    Types: Marijuana  . Sexual activity: Never    Birth control/protection: None  Other Topics Concern  . Not on file  Social History Narrative  . Not on file   Social Determinants of Health   Financial Resource Strain:   . Difficulty of Paying Living Expenses: Not on file  Food Insecurity:   . Worried About Running  Out of Food in the Last Year: Not on file  . Ran Out of Food in the Last Year: Not on file  Transportation Needs:   . Lack of Transportation (Medical): Not on file  . Lack of Transportation (Non-Medical): Not on file  Physical Activity:   . Days of Exercise per Week: Not on file  . Minutes of Exercise per Session: Not on file  Stress:   . Feeling of Stress : Not on file  Social Connections:   . Frequency of Communication with Friends and  Family: Not on file  . Frequency of Social Gatherings with Friends and Family: Not on file  . Attends Religious Services: Not on file  . Active Member of Clubs or Organizations: Not on file  . Attends Banker Meetings: Not on file  . Marital Status: Not on file   Additional Social History:                         Sleep: Fair  Appetite:  Fair  Current Medications: Current Facility-Administered Medications  Medication Dose Route Frequency Provider Last Rate Last Admin  . acetaminophen (TYLENOL) tablet 650 mg  650 mg Oral Q6H PRN Clapacs, [name], MD   650 mg at 04/18/20 0902  . alum & mag hydroxide-simeth (MAALOX/MYLANTA) 200-200-20 MG/5ML suspension 30 mL  30 mL Oral Q4H PRN Clapacs, [name], MD      . chlorproMAZINE (THORAZINE) injection 25 mg  25 mg Intramuscular TID PRN [name], MD      . haloperidol (HALDOL) tablet 5 mg  5 mg Oral Q8H PRN [name], MD   5 mg at 04/21/20 0533   And  . LORazepam (ATIVAN) tablet 2 mg  2 mg Oral Q8H PRN [name], MD   2 mg at 04/21/20 0533   And  . diphenhydrAMINE (BENADRYL) capsule 50 mg  50 mg Oral Q8H PRN [name], MD   50 mg at 04/21/20 0533  . feeding supplement (ENSURE ENLIVE / ENSURE PLUS) liquid 237 mL  237 mL Oral BID BM [name], MD   237 mL at 04/23/20 1038  . hydrOXYzine (ATARAX/VISTARIL) tablet 50 mg  50 mg Oral TID PRN Clapacs, [name], MD   50 mg at 04/18/20 1720  . LORazepam (ATIVAN) tablet 2 mg  2 mg Oral Q6H PRN [name], MD   2 mg at 04/15/20 1659   Or  . LORazepam (ATIVAN) injection 2 mg  2 mg Intramuscular Q6H PRN [name], MD      . magnesium hydroxide (MILK OF MAGNESIA) suspension 30 mL  30 mL Oral Daily PRN Clapacs, [name], MD      . QUEtiapine (SEROQUEL XR) 24 hr tablet 300 mg  300 mg Oral QHS [name], MD      . QUEtiapine (SEROQUEL) tablet 50 mg  50 mg Oral Daily [name], MD   50 mg at 04/23/20 0819  . risperiDONE (RISPERDAL) tablet  2 mg  2 mg Oral QHS [name], MD      . traZODone (DESYREL) tablet 100 mg  100 mg Oral QHS PRN Clapacs, [name], MD   100 mg at 04/15/20 2239    Lab Results:  No results found for this or any previous visit (from the past 48 hour(s)).  Blood Alcohol level:  Lab Results  Component Value Date   ETH <10 04/12/2020  ETH <10 03/03/2018    Metabolic Disorder Labs: Lab Results  Component Value Date   HGBA1C 4.7 (L) 04/13/2020   MPG 88.19 04/13/2020   MPG 93.93 03/03/2018   Lab Results  Component Value Date   PROLACTIN 17.5 05/02/2014   Lab Results  Component Value Date   CHOL 122 04/13/2020   TRIG 50 04/13/2020   HDL 48 04/13/2020   CHOLHDL 2.5 04/13/2020   VLDL 10 04/13/2020   LDLCALC 64 04/13/2020   LDLCALC 90 03/03/2018    Physical Findings: AIMS: Facial and Oral Movements Muscles of Facial Expression: None, normal Lips and Perioral Area: None, normal Jaw: None, normal Tongue: None, normal,Extremity Movements Upper (arms, wrists, hands, fingers): None, normal Lower (legs, knees, ankles, toes): None, normal, Trunk Movements Neck, shoulders, hips: None, normal, Overall Severity Severity of abnormal movements (highest score from questions above): None, normal Incapacitation due to abnormal movements: None, normal Patient's awareness of abnormal movements (rate only patient's report): No Awareness, Dental Status Current problems with teeth and/or dentures?: No  CIWA:  CIWA-Ar Total: 0 COWS:  COWS Total Score: 2  Musculoskeletal: Strength & Muscle Tone: within normal limits Gait & Station: normal Patient leans: N/A  Psychiatric Specialty Exam: Physical Exam Vitals and nursing note reviewed.  Constitutional:      General: She is in acute distress.  HENT:     Head: Normocephalic and atraumatic.     Right Ear: External ear normal.     Left Ear: External ear normal.     Nose: Nose normal.     Mouth/Throat:     Mouth: Mucous membranes are moist.      Pharynx: Oropharynx is clear.  Eyes:     Extraocular Movements: Extraocular movements intact.     Conjunctiva/sclera: Conjunctivae normal.     Pupils: Pupils are equal, round, and reactive to light.  Cardiovascular:     Rate and Rhythm: Normal rate.     Pulses: Normal pulses.  Pulmonary:     Effort: Pulmonary effort is normal.     Breath sounds: Normal breath sounds.  Abdominal:     General: Abdomen is flat.     Palpations: Abdomen is soft.  Musculoskeletal:        General: No swelling. Normal range of motion.     Cervical back: Normal range of motion and neck supple.  Skin:    General: Skin is warm and dry.  Neurological:     General: No focal deficit present.     Mental Status: She is alert and oriented to person, place, and time.  Psychiatric:        Attention and Perception: She is inattentive. She perceives auditory hallucinations.        Mood and Affect: Mood is depressed. Affect is tearful.        Speech: Speech is tangential.        Behavior: Behavior is withdrawn.        Thought Content: Thought content is paranoid and delusional. Thought content includes suicidal ideation.        Cognition and Memory: Cognition is impaired. Memory is impaired.        Judgment: Judgment is impulsive.     Review of Systems  Constitutional: Positive for fatigue. Negative for appetite change.  HENT: Negative for rhinorrhea and sore throat.   Eyes: Negative for photophobia and visual disturbance.  Respiratory: Negative for cough and shortness of breath.   Cardiovascular: Negative for chest pain and palpitations.  Gastrointestinal: Negative for  constipation, diarrhea, nausea and vomiting.  Endocrine: Negative for cold intolerance and heat intolerance.  Genitourinary: Negative for difficulty urinating and dysuria.  Musculoskeletal: Negative for arthralgias and gait problem.  Skin: Negative for rash and wound.  Allergic/Immunologic: Negative for food allergies and immunocompromised state.   Neurological: Negative for dizziness and headaches.  Hematological: Negative for adenopathy. Does not bruise/bleed easily.  Psychiatric/Behavioral: Positive for agitation, dysphoric mood, hallucinations and suicidal ideas. The patient is nervous/anxious.     Blood pressure 103/67, pulse 84, temperature 99 F (37.2 C), temperature source Oral, resp. rate 18, height 5\' 11"  (1.803 m), weight 63.5 kg, last menstrual period 03/30/2020, SpO2 99 %.Body mass index is 19.53 kg/m.  General Appearance: Disheveled  Eye Contact:  Fair  Speech:  Slow  Volume:  Decreased  Mood:  Depressed  Affect:  Tearful  Thought Process:  Disorganized  Orientation:  Full (Time, Place, and Person)  Thought Content:  Delusions, Hallucinations: Auditory, Paranoid Ideation and Rumination  Suicidal Thoughts:  Yes.  without intent/plan  Homicidal Thoughts:  No  Memory:  Immediate;   Fair Recent;   Fair Remote;   Fair  Judgement:  Impaired  Insight:  Lacking  Psychomotor Activity:  Decreased  Concentration:  Concentration: Poor and Attention Span: Poor  Recall:  13/09/2019 of Knowledge:  Poor  Language:  Fair  Akathisia:  Negative  Handed:  Right  AIMS (if indicated):     Assets:  Desire for Improvement Housing Social Support  ADL's:  Impaired  Cognition:  Impaired,  Mild  Sleep:  Number of Hours: 8.5     Treatment Plan Summary: Daily contact with patient to assess and evaluate symptoms and progress in treatment and Medication management PLAN OF CARE:19 year old woman with a historydepression and OCDnot currently in any treatment. Immediate behavior consistent with acute psychosis. Patient is living with her family,not working,not in school,low functioning. As patient's psychosis has began to clear her diagnosis seems most consistent with MDD, recurrent, severe with psychotic features. However,differential diagnosis continues to includenew onset schizophrenia, bipolar disorder with psychosis.  Continue IVC. Decrease Seroquel to 50 mg in the morning, 300 mg in the evening. Start Risperdal 2 mg QHS to see if this is more effective for psychosis.  Haldol/Ativan/Bendaryl PRN for acute agitation.   24, MD 04/23/2020, 3:10 PM

## 2020-04-23 NOTE — Progress Notes (Signed)
Patient more psychotic today--talking about having killed people and being afraid. She took her medication with an extreme amount of encouragement and persuasion. Said she felt unsafe but did not say she was having suicidal thoughts. Says she is worried she will hurt someone else. After taking her medication, she slept. She is convinced she is on the wrong medication and that the medication is hurting her

## 2020-04-23 NOTE — [facility] Group Notes (Signed)
LCSW Group Therapy Note   04/23/2020 2:05 PM  Type of Therapy and Topic:  Group Therapy:  Overcoming Obstacles   Participation Level:  Did Not Attend   Description of Group:    In this group patients will be encouraged to explore what they see as obstacles to their own wellness and recovery. They will be guided to discuss their thoughts, feelings, and behaviors related to these obstacles. The group will process together ways to cope with barriers, with attention given to specific choices patients can make. Each patient will be challenged to identify changes they are motivated to make in order to overcome their obstacles. This group will be process-oriented, with patients participating in exploration of their own experiences as well as giving and receiving support and challenge from other group members.   Therapeutic Goals: 1. Patient will identify personal and current obstacles as they relate to admission. 2. Patient will identify barriers that currently interfere with their wellness or overcoming obstacles.  3. Patient will identify feelings, thought process and behaviors related to these barriers. 4. Patient will identify two changes they are willing to make to overcome these obstacles:      Summary of Patient Progress X   Therapeutic Modalities:   Cognitive Behavioral Therapy Solution Focused Therapy Motivational Interviewing Relapse Prevention Therapy  Penni Homans, MSW, LCSW 04/23/2020 2:05 PM

## 2020-04-23 NOTE — Progress Notes (Signed)
Recreation Therapy Notes  Date: 04/23/2020  Time: 9:30 am   Location: Craft room     Behavioral response: N/A   Intervention Topic: Self-care   Discussion/Intervention: Patient did not attend group.   Clinical Observations/Feedback:  Patient did not attend group.   Nataniel Gasper LRT/CTRS        Robin Hess 04/23/2020 11:50 AM

## 2020-04-23 NOTE — Plan of Care (Signed)
Patient pacing and running up and down in the hall way.Patient appears sad and states " I hate myself." Verbalized passive suicidal ideation at times with no plan and denies homicidal ideation.Compliant with medications.Patient had dinner.Support and encouragement given.

## 2020-04-23 NOTE — Progress Notes (Signed)
Patient states " I am scared.I don't know." Patient also stated that " the beeping noise"  is back of her head. Patient stays in bed not initiating to do any ADLs today. Compliant with medications.Patient had breakfast.Did not eat lunch.Support and encouragement given.

## 2020-04-23 NOTE — Plan of Care (Signed)
  Problem: Consults Goal: Concurrent Medical Patient Education Description: (See Patient Education Module for education specifics) Outcome: Not Progressing   Problem: [facility] Concurrent Medical Problem Goal: LTG-Pt will be physically stable and he/significant other Description: (Patient will be physically stable and he/significant other will be able to verbalize understanding of follow-up care and symptoms that would warrant further treatment) Outcome: Not Progressing Goal: STG-Vital signs will be within defined limits or stabilized Description: (STG- Vital signs will be within defined limits or stabilized for individual) Outcome: Not Progressing Goal: STG-Compliance with medication and/or treatment as ordered Description: (STG-Compliance with medication and/or treatment as ordered by MD) Outcome: Not Progressing Goal: STG-Verbalize two symptoms that would warrant further Description: (STG-Verbalize two symptoms that would warrant further treatment) Outcome: Not Progressing Goal: STG-Patient will participate in management/stabilization Description: (STG-Patient will participate in management/stabilization of medical condition) Outcome: Not Progressing Goal: STG-Other (Specify): Description: STG-Other Concurrent Medical (Specify): Outcome: Not Progressing   Problem: Education: Goal: Utilization of techniques to improve thought processes will improve Outcome: Not Progressing Goal: Knowledge of the prescribed therapeutic regimen will improve Outcome: Not Progressing   Problem: Activity: Goal: Interest or engagement in leisure activities will improve Outcome: Not Progressing Goal: Imbalance in normal sleep/wake cycle will improve Outcome: Not Progressing   Problem: Coping: Goal: Coping ability will improve Outcome: Not Progressing Goal: Will verbalize feelings Outcome: Not Progressing   Problem: Health Behavior/Discharge Planning: Goal: Ability to make decisions will  improve Outcome: Not Progressing Goal: Compliance with therapeutic regimen will improve Outcome: Not Progressing   Problem: Role Relationship: Goal: Will demonstrate positive changes in social behaviors and relationships Outcome: Not Progressing   Problem: Safety: Goal: Ability to disclose and discuss suicidal ideas will improve Outcome: Not Progressing Goal: Ability to identify and utilize support systems that promote safety will improve Outcome: Not Progressing   Problem: Self-Concept: Goal: Will verbalize positive feelings about self Outcome: Not Progressing Goal: Level of anxiety will decrease Outcome: Not Progressing

## 2020-04-24 DIAGNOSIS — F23 Brief psychotic disorder: Secondary | ICD-10-CM | POA: Diagnosis not present

## 2020-04-24 MED ORDER — QUETIAPINE FUMARATE ER 200 MG PO TB24
200.0000 mg | ORAL_TABLET | Freq: Every day | ORAL | Status: DC
  Administered 2020-04-24: 200 mg via ORAL
  Filled 2020-04-24 (×2): qty 1

## 2020-04-24 NOTE — [facility] Group Notes (Signed)
LCSW Group Therapy Note  04/24/2020 2:21 PM  Type of Therapy/Topic:  Group Therapy:  Feelings about Diagnosis  Participation Level:  None   Description of Group:   This group will allow patients to explore their thoughts and feelings about diagnoses they have received. Patients will be guided to explore their level of understanding and acceptance of these diagnoses. Facilitator will encourage patients to process their thoughts and feelings about the reactions of others to their diagnosis and will guide patients in identifying ways to discuss their diagnosis with significant others in their lives. This group will be process-oriented, with patients participating in exploration of their own experiences, giving and receiving support, and processing challenge from other group members.   Therapeutic Goals: 1. Patient will demonstrate understanding of diagnosis as evidenced by identifying two or more symptoms of the disorder 2. Patient will be able to express two feelings regarding the diagnosis 3. Patient will demonstrate their ability to communicate their needs through discussion and/or role play  Summary of Patient Progress: Patient was present for group. Patient did not contribute to the topic of discussion or provide advice to others.     Therapeutic Modalities:   Cognitive Behavioral Therapy Brief Therapy Feelings Identification   Gwenevere Ghazi, MSW, [place], [place] 04/24/2020 2:21 PM

## 2020-04-24 NOTE — Progress Notes (Signed)
Patient alert and oriented x 3, with periods of confusion to situation, affect is blunted thoughts are disorganized, she appears, anxious and pacing the unit, she was noted running towards exit doors numerous times,  she was frequently redirected for safety. Patient is complaint with  medication and snacks. Patient currently denies SI/HI/AVH, 15 minutes safety checks maintained will continue to monitor

## 2020-04-24 NOTE — Progress Notes (Signed)
[facility] MD Progress Note  04/24/2020 10:10 AM Robin Hess  MRN:  [id number]   Subjective:  19 year old woman brought into the hospital under IVC Patient was brought in by EMS after running away from family running out through the woods with bizarre talking behavior. Reported by EMS that the patient was muttering to herself about being guilty of something or of having killed someone.  Yesterday afternoon patient was pacing halls stating she hates herself and wanted to die. She was given oral Ativan for anxiety. This morning, patient was again very anxious and provided with Atarax 50 mg for anxiety.   She was seen one-on-one in office today. She is still visibly responding to internal stimuli, and speech is delayed. However, content is more logical and coherent today. She notes that she was getting extremely anxious because she was hearing command auditory hallucinations telling her to do "terrible and gross things" such as masturbating on the floor in front of her family. They eventually told her to start hurting people and she became frightened and ran through the woods, and was brought to the hospital. She notes she is still hearing voices, but denies they are telling her to do things in the hospital. She continues to have passive suicidal thoughts at this time as well. She denies homicidal ideations and visual hallucinations. Discussed current diagnosis, current medications, and plan of care again today. She seemed to comprehend this information better today.   Principal Problem: Psychosis (HCC) Diagnosis: Principal Problem:   Psychosis (HCC) Active Problems:   Social anxiety disorder   OCD (obsessive compulsive disorder)  Total Time spent with patient: 1 hour  Past Psychiatric History: Patient has had 2 prior hospitalizations in 2015 and 2019. Last diagnosis was depression and OCD. Did not follow-up with outpatient treatment. Not currently getting any outpatient treatment. Has made  suicidal statements in the past. Recently had not been trying to hurt her self or showing any violence to others. Some history of cannabis use in the past but currently does not seem to be an active issue.  Past Medical History:  Past Medical History:  Diagnosis Date  . Allergy   . Anxiety   . Cannabis use disorder, moderate, dependence (HCC)   . Depression   . OCD (obsessive compulsive disorder)   . Severe major depression, single episode, with psychotic features (HCC)   . Social anxiety disorder    History reviewed. No pertinent surgical history. Family History: History reviewed. No pertinent family history. Family Psychiatric  History: None reported Social History:  Social History   Substance and Sexual Activity  Alcohol Use No     Social History   Substance and Sexual Activity  Drug Use Yes  . Types: Marijuana    Social History   Socioeconomic History  . Marital status: Single    Spouse name: Not on file  . Number of children: Not on file  . Years of education: Not on file  . Highest education level: Not on file  Occupational History  . Not on file  Tobacco Use  . Smoking status: Never Smoker  . Smokeless tobacco: Never Used  Substance and Sexual Activity  . Alcohol use: No  . Drug use: Yes    Types: Marijuana  . Sexual activity: Never    Birth control/protection: None  Other Topics Concern  . Not on file  Social History Narrative  . Not on file   Social Determinants of Health   Financial Resource Strain:   . Difficulty  of Paying Living Expenses: Not on file  Food Insecurity:   . Worried About Programme researcher, broadcasting/film/videounning Out of Food in the Last Year: Not on file  . Ran Out of Food in the Last Year: Not on file  Transportation Needs:   . Lack of Transportation (Medical): Not on file  . Lack of Transportation (Non-Medical): Not on file  Physical Activity:   . Days of Exercise per Week: Not on file  . Minutes of Exercise per Session: Not on file  Stress:   . Feeling of  Stress : Not on file  Social Connections:   . Frequency of Communication with Friends and Family: Not on file  . Frequency of Social Gatherings with Friends and Family: Not on file  . Attends Religious Services: Not on file  . Active Member of Clubs or Organizations: Not on file  . Attends BankerClub or Organization Meetings: Not on file  . Marital Status: Not on file   Additional Social History:                         Sleep: Fair  Appetite:  Fair  Current Medications: Current Facility-Administered Medications  Medication Dose Route Frequency Provider Last Rate Last Admin  . acetaminophen (TYLENOL) tablet 650 mg  650 mg Oral Q6H PRN Clapacs, Jackquline DenmarkJohn T, MD   650 mg at 04/24/20 0818  . alum & mag hydroxide-simeth (MAALOX/MYLANTA) 200-200-20 MG/5ML suspension 30 mL  30 mL Oral Q4H PRN Clapacs, [name], MD      . chlorproMAZINE (THORAZINE) injection 25 mg  25 mg Intramuscular TID PRN Jesse SansFreeman, Orrin Yurkovich M, MD      . haloperidol (HALDOL) tablet 5 mg  5 mg Oral Q8H PRN Jesse SansFreeman, Dannica Bickham M, MD   5 mg at 04/21/20 0533   And  . LORazepam (ATIVAN) tablet 2 mg  2 mg Oral Q8H PRN Jesse SansFreeman, Juandaniel Manfredo M, MD   2 mg at 04/23/20 2115   And  . diphenhydrAMINE (BENADRYL) capsule 50 mg  50 mg Oral Q8H PRN Jesse SansFreeman, Jackelyne Sayer M, MD   50 mg at 04/23/20 2115  . feeding supplement (ENSURE ENLIVE / ENSURE PLUS) liquid 237 mL  237 mL Oral BID BM Jesse SansFreeman, Jarin Cornfield M, MD   237 mL at 04/24/20 0940  . hydrOXYzine (ATARAX/VISTARIL) tablet 50 mg  50 mg Oral TID PRN Clapacs, Jackquline DenmarkJohn T, MD   50 mg at 04/24/20 0819  . LORazepam (ATIVAN) tablet 2 mg  2 mg Oral Q6H PRN Jesse SansFreeman, Angeline Trick M, MD   2 mg at 04/15/20 1659   Or  . LORazepam (ATIVAN) injection 2 mg  2 mg Intramuscular Q6H PRN Jesse SansFreeman, Eldon Zietlow M, MD      . magnesium hydroxide (MILK OF MAGNESIA) suspension 30 mL  30 mL Oral Daily PRN Clapacs, [name], MD      . QUEtiapine (SEROQUEL XR) 24 hr tablet 200 mg  200 mg Oral QHS Jesse SansFreeman, Harsha Yusko M, MD      . risperiDONE (RISPERDAL) tablet 2 mg  2 mg  Oral QHS Jesse SansFreeman, Kilan Banfill M, MD   2 mg at 04/23/20 2115  . sodium chloride (OCEAN) 0.65 % nasal spray 2 spray  2 spray Each Nare Q4H PRN Jesse SansFreeman, Kennen Stammer M, MD      . traZODone (DESYREL) tablet 100 mg  100 mg Oral QHS PRN Clapacs, Jackquline DenmarkJohn T, MD   100 mg at 04/23/20 2115    Lab Results:  No results found for this or any previous visit (  from the past 48 hour(s)).  Blood Alcohol level:  Lab Results  Component Value Date   ETH <10 04/12/2020   ETH <10 03/03/2018    Metabolic Disorder Labs: Lab Results  Component Value Date   HGBA1C 4.7 (L) 04/13/2020   MPG 88.19 04/13/2020   MPG 93.93 03/03/2018   Lab Results  Component Value Date   PROLACTIN 17.5 05/02/2014   Lab Results  Component Value Date   CHOL 122 04/13/2020   TRIG 50 04/13/2020   HDL 48 04/13/2020   CHOLHDL 2.5 04/13/2020   VLDL 10 04/13/2020   LDLCALC 64 04/13/2020   LDLCALC 90 03/03/2018    Physical Findings: AIMS: Facial and Oral Movements Muscles of Facial Expression: None, normal Lips and Perioral Area: None, normal Jaw: None, normal Tongue: None, normal,Extremity Movements Upper (arms, wrists, hands, fingers): None, normal Lower (legs, knees, ankles, toes): None, normal, Trunk Movements Neck, shoulders, hips: None, normal, Overall Severity Severity of abnormal movements (highest score from questions above): None, normal Incapacitation due to abnormal movements: None, normal Patient's awareness of abnormal movements (rate only patient's report): No Awareness, Dental Status Current problems with teeth and/or dentures?: No  CIWA:  CIWA-Ar Total: 0 COWS:  COWS Total Score: 2  Musculoskeletal: Strength & Muscle Tone: within normal limits Gait & Station: normal Patient leans: N/A  Psychiatric Specialty Exam: Physical Exam Vitals and nursing note reviewed.  Constitutional:      General: She is in acute distress.  HENT:     Head: Normocephalic and atraumatic.     Right Ear: External ear normal.     Left  Ear: External ear normal.     Nose: Nose normal.     Mouth/Throat:     Mouth: Mucous membranes are moist.     Pharynx: Oropharynx is clear.  Eyes:     Extraocular Movements: Extraocular movements intact.     Conjunctiva/sclera: Conjunctivae normal.     Pupils: Pupils are equal, round, and reactive to light.  Cardiovascular:     Rate and Rhythm: Normal rate.     Pulses: Normal pulses.  Pulmonary:     Effort: Pulmonary effort is normal.     Breath sounds: Normal breath sounds.  Abdominal:     General: Abdomen is flat.     Palpations: Abdomen is soft.  Musculoskeletal:        General: No swelling. Normal range of motion.     Cervical back: Normal range of motion and neck supple.  Skin:    General: Skin is warm and dry.  Neurological:     General: No focal deficit present.     Mental Status: She is alert and oriented to person, place, and time.  Psychiatric:        Attention and Perception: She is inattentive. She perceives auditory hallucinations.        Mood and Affect: Mood is depressed. Affect is tearful.        Speech: Speech is delayed.        Behavior: Behavior is cooperative.        Thought Content: Thought content is paranoid and delusional. Thought content includes suicidal ideation.        Cognition and Memory: Cognition is impaired. Memory is impaired.        Judgment: Judgment is impulsive.     Review of Systems  Constitutional: Positive for fatigue. Negative for appetite change.  HENT: Negative for rhinorrhea and sore throat.   Eyes: Negative for photophobia and visual  disturbance.  Respiratory: Negative for cough and shortness of breath.   Cardiovascular: Negative for chest pain and palpitations.  Gastrointestinal: Negative for constipation, diarrhea, nausea and vomiting.  Endocrine: Negative for cold intolerance and heat intolerance.  Genitourinary: Negative for difficulty urinating and dysuria.  Musculoskeletal: Negative for arthralgias and gait problem.   Skin: Negative for rash and wound.  Allergic/Immunologic: Negative for food allergies and immunocompromised state.  Neurological: Negative for dizziness and headaches.  Hematological: Negative for adenopathy. Does not bruise/bleed easily.  Psychiatric/Behavioral: Positive for agitation, dysphoric mood, hallucinations and suicidal ideas. The patient is nervous/anxious.     Blood pressure 109/71, pulse (!) 112, temperature 98.7 F (37.1 C), temperature source Oral, resp. rate 18, height 5\' 11"  (1.803 m), weight 63.5 kg, last menstrual period 03/30/2020, SpO2 98 %.Body mass index is 19.53 kg/m.  General Appearance: Disheveled  Eye Contact:  Fair  Speech:  Slow  Volume:  Normal  Mood:  Depressed  Affect:  Tearful  Thought Process:  Disorganized  Orientation:  Full (Time, Place, and Person)  Thought Content:  Delusions, Hallucinations: Auditory, Paranoid Ideation and Rumination  Suicidal Thoughts:  Yes.  without intent/plan  Homicidal Thoughts:  No  Memory:  Immediate;   Fair Recent;   Fair Remote;   Fair  Judgement:  Impaired  Insight:  Present  Psychomotor Activity:  Restlessness  Concentration:  Concentration: Poor and Attention Span: Poor  Recall:  13/09/2019 of Knowledge:  Poor  Language:  Fair  Akathisia:  Negative  Handed:  Right  AIMS (if indicated):     Assets:  Desire for Improvement Housing Social Support  ADL's:  Impaired  Cognition:  Impaired,  Mild  Sleep:  Number of Hours: 8     Treatment Plan Summary: Daily contact with patient to assess and evaluate symptoms and progress in treatment and Medication management PLAN OF CARE:19 year old woman with a historydepression and OCDnot currently in any treatment. Immediate behavior consistent with acute psychosis. Patient is living with her family,not working,not in school,low functioning. As patient's psychosis has began to clear her diagnosis seems most consistent with MDD, recurrent, severe with psychotic  features. However,differential diagnosis continues to includenew onset schizophrenia, bipolar disorder with psychosis. Continue IVC. Seroquel not effective at 450 mg total dose, and have started to transition to Risperdal. Decrease Seroquel to 200 mg in the evening. Continue Risperdal 2 mg QHS to see if this is more effective for psychosis.  Haldol/Ativan/Bendaryl PRN for acute agitation.   34, MD 04/24/2020, 10:10 AM

## 2020-04-24 NOTE — Progress Notes (Signed)
D: Pt alert and oriented. Pt rates depression 1/10, hopelessness 1/10, and anxiety 2/10. Pt goal: "Try to take care of myself." Pt reports energy level as normal and concentration as being good. Pt reports sleep last night as being good. Pt did receive medications for sleep and did find them helpful. Pt reports experiencing 4/10 generalized pain, prn meds given. Pt denies/reports experiencing any SI/HI, or VH at this time, however endorses AH w/o any details given. Pt states the voices are getting better.   Pt observed in the dayroom coloring and later watching tv.   A: Scheduled medications administered to pt, per MD orders. Support and encouragement provided. Frequent verbal contact made. Routine safety checks conducted q15 minutes.   R: No adverse drug reactions noted. Pt verbally contracts for safety at this time. Pt complaint with medications. Pt remains safe at this time. Will continue to monitor.

## 2020-04-24 NOTE — Progress Notes (Signed)
Recreation Therapy Notes   Date: 04/24/2020  Time: 9:30 am   Location: Craft room     Behavioral response: N/A   Intervention Topic: Stress Management   Discussion/Intervention: Patient did not attend group.   Clinical Observations/Feedback:  Patient did not attend group.   Caleb Prigmore LRT/CTRS        Robin Hess 04/24/2020 12:58 PM 

## 2020-04-25 MED ORDER — RISPERIDONE 1 MG PO TBDP: 3.0000 mg | ORAL_TABLET | Freq: Every day | ORAL | Status: DC

## 2020-04-25 MED ORDER — FLUVOXAMINE MALEATE 50 MG PO TABS
50.0000 mg | ORAL_TABLET | Freq: Every day | ORAL | Status: DC
  Administered 2020-04-26: 50 mg via ORAL
  Filled 2020-04-25: qty 1

## 2020-04-25 MED ORDER — RISPERIDONE 1 MG PO TBDP
0.5000 mg | ORAL_TABLET | Freq: Every day | ORAL | Status: DC
  Administered 2020-04-25 – 2020-04-26 (×2): 0.5 mg via ORAL
  Filled 2020-04-25 (×2): qty 1

## 2020-04-25 MED ORDER — QUETIAPINE FUMARATE ER 50 MG PO TB24
100.0000 mg | ORAL_TABLET | Freq: Every day | ORAL | Status: DC
  Administered 2020-04-25: 100 mg via ORAL
  Filled 2020-04-25 (×2): qty 2

## 2020-04-25 NOTE — Progress Notes (Signed)
Patient alert and oriented x 3, with periods of confusion to situation, affect is blunted thoughts are disorganized, she appears,restless, anxious and pacing the unit, she was noted running towards exit doors numerous times, she was frequently redirected for safety. Patient is complaint with  medication and snacks. Patient currently denies SI/HI/AVH, 15 minutes safety checks maintained will continue to monitor

## 2020-04-25 NOTE — Progress Notes (Signed)
Pt is on the phone with mother and observed crying. When asked why she was crying pt stated that the voices are telling her that when she goes to sleep and wakes up her mother will be dead. Pt given reassurance and asked if she need medications to help her calm down pt states she was going to calm down and apologized. Pt understands that if she does not calm down that she will have to get off the phone.

## 2020-04-25 NOTE — Plan of Care (Signed)
  Problem: Group Participation Goal: STG - Patient will engage in groups without prompting or encouragement from LRT x3 group sessions within 5 recreation therapy group sessions Description: STG - Patient will engage in groups without prompting or encouragement from LRT x3 group sessions within 5 recreation therapy group sessions Outcome: Not Progressing   

## 2020-04-25 NOTE — Progress Notes (Signed)
D: Pt alert and oriented. Pt rates depression 10/10, hopelessness 10/10, and anxiety 10/10 .Pt goal: "To go away." Pt reports energy level as low and concentration as being poor. Pt reports sleep last night as being poor. Pt did not receive medications for sleep. Pt reports experiencing generalize pain, pt gives no further details and refuses any med management meds. Pt denies experiencing any SI/HI, or VH at this time, however endorse experiencing AH without giving any further details.   Pt received Ativan injection this afternoon after being in the physician's office and having an emotional breakdown. Pt became very upset, bawling, yelling, and cussing when there was an unsuccessful attempt to contact her mother. Pt did eventually calm down. Also mother's alternative phone number was able to be located on a paper in the pt's room. Mother was contacted and pt did speak with her and felt better.  A: Scheduled medications administered to pt, per MD orders. Support and encouragement provided. Frequent verbal contact made. Routine safety checks conducted q15 minutes.   R: No adverse drug reactions noted. Pt verbally contracts for safety at this time. Pt complaint with medications. Pt interacts minimally with others on the unit. Pt remains safe at this time. Will continue to monitor.

## 2020-04-25 NOTE — Progress Notes (Signed)
[facility]  04/25/2020 1:33 PM Robin Hess  MRN:  [id number]   Subjective:  19 year old woman brought into the hospital under IVC Patient was brought in by EMS after running away from family running out through the woods with bizarre talking behavior. Reported by EMS that the patient was muttering to herself about being guilty of something or of having killed someone.  Yesterday, patient intermittently confused, disorganized, and running towards the exit doors. There was also concern that patient has been cheeking her medications to spit out in her room.   She was seen one-on-one in office today. She is still visibly responding to internal stimuli, speech is delayed, and she begins crying hysterically. She states that everyone hates her, the voices are real, and that she killed everyone. She is convinced that her entire family and everyone else outside of the hospital is dead. She is unable to be calmed down verbally, or with offers to contact her family via phone. She ultimately required PRN Ativan IM this afternoon. Will switch Risperdal tabs to Risperdal M-tabs for compliance. Have also restarted Luvox 50 mg QHS for depression and OCD.   Principal Problem: Psychosis (HCC) Diagnosis: Principal Problem:   Psychosis (HCC) Active Problems:   Social anxiety disorder   OCD (obsessive compulsive disorder)  Total Time spent with patient: 1 hour  Past Psychiatric History: Patient has had 2 prior hospitalizations in 2015 and 2019. Last diagnosis was depression and OCD. Did not follow-up with outpatient treatment. Not currently getting any outpatient treatment. Has made suicidal statements in the past. Recently had not been trying to hurt her self or showing any violence to others. Some history of cannabis use in the past but currently does not seem to be an active issue.  Past Medical History:  Past Medical History:  Diagnosis Date  . Allergy   . Anxiety   . Cannabis use  disorder, moderate, dependence (HCC)   . Depression   . OCD (obsessive compulsive disorder)   . Severe major depression, single episode, with psychotic features (HCC)   . Social anxiety disorder    History reviewed. No pertinent surgical history. Family History: History reviewed. No pertinent family history. Family Psychiatric  History: None reported Social History:  Social History   Substance and Sexual Activity  Alcohol Use No     Social History   Substance and Sexual Activity  Drug Use Yes  . Types: Marijuana    Social History   Socioeconomic History  . Marital status: Single    Spouse name: Not on file  . Number of children: Not on file  . Years of education: Not on file  . Highest education level: Not on file  Occupational History  . Not on file  Tobacco Use  . Smoking status: Never Smoker  . Smokeless tobacco: Never Used  Substance and Sexual Activity  . Alcohol use: No  . Drug use: Yes    Types: Marijuana  . Sexual activity: Never    Birth control/protection: None  Other Topics Concern  . Not on file  Social History Narrative  . Not on file   Social Determinants of Health   Financial Resource Strain:   . Difficulty of Paying Living Expenses: Not on file  Food Insecurity:   . Worried About Programme researcher, broadcasting/film/videounning Out of Food in the Last Year: Not on file  . Ran Out of Food in the Last Year: Not on file  Transportation Needs:   . Lack of Transportation (Medical): Not  on file  . Lack of Transportation (Non-Medical): Not on file  Physical Activity:   . Days of Exercise per Week: Not on file  . Minutes of Exercise per Session: Not on file  Stress:   . Feeling of Stress : Not on file  Social Connections:   . Frequency of Communication with Friends and Family: Not on file  . Frequency of Social Gatherings with Friends and Family: Not on file  . Attends Religious Services: Not on file  . Active Member of Clubs or Organizations: Not on file  . Attends Tax inspector Meetings: Not on file  . Marital Status: Not on file   Additional Social History:                         Sleep: Fair  Appetite:  Fair  Current Medications: Current Facility-Administered Medications  Medication Dose Route Frequency Provider Last Rate Last Admin  . acetaminophen (TYLENOL) tablet 650 mg  650 mg Oral Q6H PRN Clapacs, [name], MD   650 mg at 04/24/20 0818  . alum & mag hydroxide-simeth (MAALOX/MYLANTA) 200-200-20 MG/5ML suspension 30 mL  30 mL Oral Q4H PRN Clapacs, [name], MD      . chlorproMAZINE (THORAZINE) injection 25 mg  25 mg Intramuscular TID PRN [name], MD      . haloperidol (HALDOL) tablet 5 mg  5 mg Oral Q8H PRN [name], MD   5 mg at 04/21/20 0533   And  . LORazepam (ATIVAN) tablet 2 mg  2 mg Oral Q8H PRN [name], MD   2 mg at 04/23/20 2115   And  . diphenhydrAMINE (BENADRYL) capsule 50 mg  50 mg Oral Q8H PRN [name], MD   50 mg at 04/23/20 2115  . feeding supplement (ENSURE ENLIVE / ENSURE PLUS) liquid 237 mL  237 mL Oral BID BM [name], MD   237 mL at 04/25/20 1105  . fluvoxaMINE (LUVOX) tablet 50 mg  50 mg Oral QHS [name], MD      . hydrOXYzine (ATARAX/VISTARIL) tablet 50 mg  50 mg Oral TID PRN Clapacs, [name], MD   50 mg at 04/24/20 0819  . LORazepam (ATIVAN) tablet 2 mg  2 mg Oral Q6H PRN [name], MD   2 mg at 04/24/20 2121   Or  . LORazepam (ATIVAN) injection 2 mg  2 mg Intramuscular Q6H PRN [name], MD   2 mg at 04/25/20 1246  . magnesium hydroxide (MILK OF MAGNESIA) suspension 30 mL  30 mL Oral Daily PRN Clapacs, [name], MD      . QUEtiapine (SEROQUEL XR) 24 hr tablet 100 mg  100 mg Oral QHS [name], MD      . risperiDONE (RISPERDAL M-TABS) disintegrating tablet 0.5 mg  0.5 mg Oral Daily [name], MD   0.5 mg at 04/25/20 1122  . risperiDONE (RISPERDAL M-TABS) disintegrating tablet 3 mg  3 mg Oral QHS [name], MD      . risperiDONE  (RISPERDAL) tablet 2 mg  2 mg Oral QHS [name], MD   2 mg at 04/24/20 2121  . sodium chloride (OCEAN) 0.65 % nasal spray 2 spray  2 spray Each Nare Q4H PRN [name], MD      . traZODone (DESYREL) tablet 100 mg  100 mg Oral QHS PRN Clapacs, [name], MD  100 mg at 04/23/20 2115    Lab Results:  No results found for this or any previous visit (from the past 48 hour(s)).  Blood Alcohol level:  Lab Results  Component Value Date   ETH <10 04/12/2020   ETH <10 03/03/2018    Metabolic Disorder Labs: Lab Results  Component Value Date   HGBA1C 4.7 (L) 04/13/2020   MPG 88.19 04/13/2020   MPG 93.93 03/03/2018   Lab Results  Component Value Date   PROLACTIN 17.5 05/02/2014   Lab Results  Component Value Date   CHOL 122 04/13/2020   TRIG 50 04/13/2020   HDL 48 04/13/2020   CHOLHDL 2.5 04/13/2020   VLDL 10 04/13/2020   LDLCALC 64 04/13/2020   LDLCALC 90 03/03/2018    Physical Findings: AIMS: Facial and Oral Movements Muscles of Facial Expression: None, normal Lips and Perioral Area: None, normal Jaw: None, normal Tongue: None, normal,Extremity Movements Upper (arms, wrists, hands, fingers): None, normal Lower (legs, knees, ankles, toes): None, normal, Trunk Movements Neck, shoulders, hips: None, normal, Overall Severity Severity of abnormal movements (highest score from questions above): None, normal Incapacitation due to abnormal movements: None, normal Patient's awareness of abnormal movements (rate only patient's report): No Awareness, Dental Status Current problems with teeth and/or dentures?: No  CIWA:  CIWA-Ar Total: 0 COWS:  COWS Total Score: 2  Musculoskeletal: Strength & Muscle Tone: within normal limits Gait & Station: normal Patient leans: N/A  Psychiatric Specialty Exam: Physical Exam Vitals and nursing note reviewed.  Constitutional:      General: She is in acute distress.  HENT:     Head: Normocephalic and atraumatic.     Right Ear:  External ear normal.     Left Ear: External ear normal.     Nose: Nose normal.     Mouth/Throat:     Mouth: Mucous membranes are moist.     Pharynx: Oropharynx is clear.  Eyes:     Extraocular Movements: Extraocular movements intact.     Conjunctiva/sclera: Conjunctivae normal.     Pupils: Pupils are equal, round, and reactive to light.  Cardiovascular:     Rate and Rhythm: Normal rate.     Pulses: Normal pulses.  Pulmonary:     Effort: Pulmonary effort is normal.     Breath sounds: Normal breath sounds.  Abdominal:     General: Abdomen is flat.     Palpations: Abdomen is soft.  Musculoskeletal:        General: No swelling. Normal range of motion.     Cervical back: Normal range of motion and neck supple.  Skin:    General: Skin is warm and dry.  Neurological:     General: No focal deficit present.     Mental Status: She is alert and oriented to person, place, and time.  Psychiatric:        Attention and Perception: She is inattentive. She perceives auditory hallucinations.        Mood and Affect: Mood is depressed. Affect is tearful.        Speech: Speech is delayed.        Behavior: Behavior is cooperative.        Thought Content: Thought content is paranoid and delusional. Thought content includes suicidal ideation.        Cognition and Memory: Cognition is impaired. Memory is impaired.        Judgment: Judgment is impulsive.     Review of Systems  Constitutional: Positive for fatigue.  Negative for appetite change.  HENT: Negative for rhinorrhea and sore throat.   Eyes: Negative for photophobia and visual disturbance.  Respiratory: Negative for cough and shortness of breath.   Cardiovascular: Negative for chest pain and palpitations.  Gastrointestinal: Negative for constipation, diarrhea, nausea and vomiting.  Endocrine: Negative for cold intolerance and heat intolerance.  Genitourinary: Negative for difficulty urinating and dysuria.  Musculoskeletal: Negative for  arthralgias and gait problem.  Skin: Negative for rash and wound.  Allergic/Immunologic: Negative for food allergies and immunocompromised state.  Neurological: Negative for dizziness and headaches.  Hematological: Negative for adenopathy. Does not bruise/bleed easily.  Psychiatric/Behavioral: Positive for agitation, dysphoric mood, hallucinations and suicidal ideas. The patient is nervous/anxious.     Blood pressure 115/72, pulse (!) 101, temperature 98.3 F (36.8 C), temperature source Oral, resp. rate 18, height 5\' 11"  (1.803 m), weight 63.5 kg, last menstrual period 03/30/2020, SpO2 97 %.Body mass index is 19.53 kg/m.  General Appearance: Disheveled  Eye Contact:  Fair  Speech:  Slow  Volume:  Normal  Mood:  Depressed  Affect:  Tearful  Thought Process:  Disorganized  Orientation:  Full (Time, Place, and Person)  Thought Content:  Delusions, Hallucinations: Auditory, Paranoid Ideation and Rumination  Suicidal Thoughts:  Yes.  without intent/plan  Homicidal Thoughts:  No  Memory:  Immediate;   Fair Recent;   Fair Remote;   Fair  Judgement:  Impaired  Insight:  Present  Psychomotor Activity:  Restlessness  Concentration:  Concentration: Poor and Attention Span: Poor  Recall:  13/09/2019 of Knowledge:  Poor  Language:  Fair  Akathisia:  Negative  Handed:  Right  AIMS (if indicated):     Assets:  Desire for Improvement Housing Social Support  ADL's:  Impaired  Cognition:  Impaired,  Mild  Sleep:  Number of Hours: 8.5     Treatment Plan Summary: Daily contact with patient to assess and evaluate symptoms and progress in treatment and Medication management PLAN OF CARE:19 year old woman with a historydepression and OCDnot currently in any treatment. Immediate behavior consistent with acute psychosis. Patient is living with her family,not working,not in school,low functioning. As patient's psychosis has began to clear her diagnosis seems most consistent with MDD,  recurrent, severe with psychotic features. However,differential diagnosis continues to includenew onset schizophrenia, bipolar disorder with psychosis. Continue IVC. Seroquel not effective at 450 mg total dose, and have started to transition to Risperdal. Decrease Seroquel to 100 mg in the evening. Increase Risperdal M-tab to 1 mg daily and 3 mg QHS to see if this is more effective for psychosis.  Haldol/Ativan/Bendaryl PRN for acute agitation.   34, MD 04/25/2020, 1:33 PM

## 2020-04-25 NOTE — Progress Notes (Signed)
[phone] This is Ashima's Mother's alternate phone number to reach her.

## 2020-04-25 NOTE — Plan of Care (Signed)
  Problem: Education: Goal: Knowledge of the prescribed therapeutic regimen will improve Outcome: Not Progressing   Problem: Activity: Goal: Interest or engagement in leisure activities will improve Outcome: Progressing   Problem: Coping: Goal: Coping ability will improve Outcome: Progressing Goal: Will verbalize feelings Outcome: Progressing   Problem: Health Behavior/Discharge Planning: Goal: Compliance with therapeutic regimen will improve Outcome: Progressing   Problem: Safety: Goal: Ability to disclose and discuss suicidal ideas will improve Outcome: Progressing

## 2020-04-25 NOTE — Progress Notes (Signed)
Patient approached nurse at beginning of shift and wanted to take her night meds early due to anxiety and some paranoia. Writer agreed due to difficulty at times taking meds. Patient had other meds later in shift, but patient asleep and would not wake up.  No crying episodes or outbursts thus far this shift. Pt visible in milieu. Called mother and talked. Pt reports still hearing voices.  Encouragement and support provided. Safety checks maintained. Medications given as prescribed. Pt receptive and remains safe on unit with q 15 min checks.

## 2020-04-25 NOTE — [facility] Group Notes (Signed)
LCSW Group Therapy Note  04/25/2020 2:15 PM  Type of Therapy/Topic:  Group Therapy:  Emotion Regulation  Participation Level:  Did Not Attend   Description of Group:   The purpose of this group is to assist patients in learning to regulate negative emotions and experience positive emotions. Patients will be guided to discuss ways in which they have been vulnerable to their negative emotions. These vulnerabilities will be juxtaposed with experiences of positive emotions or situations, and patients will be challenged to use positive emotions to combat negative ones. Special emphasis will be placed on coping with negative emotions in conflict situations, and patients will process healthy conflict resolution skills.  Therapeutic Goals: 1. Patient will identify two positive emotions or experiences to reflect on in order to balance out negative emotions 2. Patient will label two or more emotions that they find the most difficult to experience 3. Patient will demonstrate positive conflict resolution skills through discussion and/or role plays  Summary of Patient Progress: X   Therapeutic Modalities:   Cognitive Behavioral Therapy Feelings Identification Dialectical Behavioral Therapy  Diannia Hogenson R. Tiann Saha, MSW, LCSW, LCAS 04/25/2020 2:15 PM   

## 2020-04-25 NOTE — Progress Notes (Signed)
Recreation Therapy Notes   Date: 04/25/2020  Time: 9:30 am   Location: Craft room     Behavioral response: N/A   Intervention Topic: Coping-Skills   Discussion/Intervention: Patient did not attend group.   Clinical Observations/Feedback:  Patient did not attend group.   Aspyn Warnke LRT/CTRS        [place] K[place]ett 04/25/2020 1:05 PM

## 2020-04-26 DIAGNOSIS — F23 Brief psychotic disorder: Secondary | ICD-10-CM | POA: Diagnosis not present

## 2020-04-26 MED ORDER — QUETIAPINE FUMARATE ER 50 MG PO TB24
50.0000 mg | ORAL_TABLET | Freq: Every day | ORAL | Status: DC
  Filled 2020-04-26: qty 1

## 2020-04-26 MED ORDER — RISPERIDONE 1 MG PO TBDP: 4.0000 mg | ORAL_TABLET | Freq: Every day | ORAL | Status: DC

## 2020-04-26 MED ORDER — RISPERIDONE 1 MG PO TBDP
3.0000 mg | ORAL_TABLET | Freq: Every day | ORAL | Status: DC
  Administered 2020-04-26 – 2020-04-29 (×2): 3 mg via ORAL
  Filled 2020-04-26 (×3): qty 3

## 2020-04-26 MED ORDER — QUETIAPINE FUMARATE ER 50 MG PO TB24
50.0000 mg | ORAL_TABLET | Freq: Every day | ORAL | Status: AC
  Administered 2020-04-26: 50 mg via ORAL
  Filled 2020-04-26: qty 1

## 2020-04-26 NOTE — Progress Notes (Signed)
[facility] MD Progress Note  04/26/2020 3:07 PM Robin Hess  MRN:  [id number]   Subjective:  19 year old woman brought into the hospital under IVC Patient was brought in by EMS after running away from family running out through the woods with bizarre talking behavior. Reported by EMS that the patient was muttering to herself about being guilty of something or of having killed someone.  Yesterday, patient intermittently confused, disorganized, and crying hysterically. She felt that everyone in the world had died including her mother. She required PRN Ativan. She was eventually able to get a hold of her mother and calmed down. She was medication compliant overnight and this morning.   She was seen one-on-one in office today. She is much clearer today, and able to maintain eye contact. Her speech is no longer delayed. She states "I don't think so, but sometime I worry everyone out there is dead. I know my mom is alive though." She denies suicidal ideations, homicidal ideations, visual hallucinations, and auditory hallucinations today. She states she is feeling depressed today due to being homesick. She also notes some daytime sedation. Discussed diagnosis and plan of care. Will shift all risperdal to bedtime to address daytime sedation.     Principal Problem: Psychosis (HCC) Diagnosis: Principal Problem:   Psychosis (HCC) Active Problems:   Social anxiety disorder   OCD (obsessive compulsive disorder)  Total Time spent with patient: 1 hour  Past Psychiatric History: Patient has had 2 prior hospitalizations in 2015 and 2019. Last diagnosis was depression and OCD. Did not follow-up with outpatient treatment. Not currently getting any outpatient treatment. Has made suicidal statements in the past. Recently had not been trying to hurt her self or showing any violence to others. Some history of cannabis use in the past but currently does not seem to be an active issue.  Past Medical History:  Past  Medical History:  Diagnosis Date  . Allergy   . Anxiety   . Cannabis use disorder, moderate, dependence (HCC)   . Depression   . OCD (obsessive compulsive disorder)   . Severe major depression, single episode, with psychotic features (HCC)   . Social anxiety disorder    History reviewed. No pertinent surgical history. Family History: History reviewed. No pertinent family history. Family Psychiatric  History: None reported Social History:  Social History   Substance and Sexual Activity  Alcohol Use No     Social History   Substance and Sexual Activity  Drug Use Yes  . Types: Marijuana    Social History   Socioeconomic History  . Marital status: Single    Spouse name: Not on file  . Number of children: Not on file  . Years of education: Not on file  . Highest education level: Not on file  Occupational History  . Not on file  Tobacco Use  . Smoking status: Never Smoker  . Smokeless tobacco: Never Used  Substance and Sexual Activity  . Alcohol use: No  . Drug use: Yes    Types: Marijuana  . Sexual activity: Never    Birth control/protection: None  Other Topics Concern  . Not on file  Social History Narrative  . Not on file   Social Determinants of Health   Financial Resource Strain:   . Difficulty of Paying Living Expenses: Not on file  Food Insecurity:   . Worried About Programme researcher, broadcasting/film/video in the Last Year: Not on file  . Ran Out of Food in the Last Year: Not on  file  Transportation Needs:   . Freight forwarderLack of Transportation (Medical): Not on file  . Lack of Transportation (Non-Medical): Not on file  Physical Activity:   . Days of Exercise per Week: Not on file  . Minutes of Exercise per Session: Not on file  Stress:   . Feeling of Stress : Not on file  Social Connections:   . Frequency of Communication with Friends and Family: Not on file  . Frequency of Social Gatherings with Friends and Family: Not on file  . Attends Religious Services: Not on file  . Active  Member of Clubs or Organizations: Not on file  . Attends BankerClub or Organization Meetings: Not on file  . Marital Status: Not on file   Additional Social History:                         Sleep: Fair  Appetite:  Fair  Current Medications: Current Facility-Administered Medications  Medication Dose Route Frequency Provider Last Rate Last Admin  . acetaminophen (TYLENOL) tablet 650 mg  650 mg Oral Q6H PRN Clapacs, Jackquline DenmarkJohn T, MD   650 mg at 04/24/20 0818  . alum & mag hydroxide-simeth (MAALOX/MYLANTA) 200-200-20 MG/5ML suspension 30 mL  30 mL Oral Q4H PRN Clapacs, [name], MD      . chlorproMAZINE (THORAZINE) injection 25 mg  25 mg Intramuscular TID PRN Jesse SansFreeman, Congetta Odriscoll M, MD      . haloperidol (HALDOL) tablet 5 mg  5 mg Oral Q8H PRN Jesse SansFreeman, Ashrita Chrismer M, MD   5 mg at 04/21/20 0533   And  . LORazepam (ATIVAN) tablet 2 mg  2 mg Oral Q8H PRN Jesse SansFreeman, Jeremias Broyhill M, MD   2 mg at 04/23/20 2115   And  . diphenhydrAMINE (BENADRYL) capsule 50 mg  50 mg Oral Q8H PRN Jesse SansFreeman, Vivaan Helseth M, MD   50 mg at 04/23/20 2115  . feeding supplement (ENSURE ENLIVE / ENSURE PLUS) liquid 237 mL  237 mL Oral BID BM Jesse SansFreeman, Laray Corbit M, MD   237 mL at 04/26/20 1027  . fluvoxaMINE (LUVOX) tablet 50 mg  50 mg Oral QHS Jesse SansFreeman, Benito Lemmerman M, MD      . hydrOXYzine (ATARAX/VISTARIL) tablet 50 mg  50 mg Oral TID PRN Clapacs, Jackquline DenmarkJohn T, MD   50 mg at 04/24/20 0819  . LORazepam (ATIVAN) tablet 2 mg  2 mg Oral Q6H PRN Jesse SansFreeman, Sevyn Markham M, MD   2 mg at 04/25/20 1914   Or  . LORazepam (ATIVAN) injection 2 mg  2 mg Intramuscular Q6H PRN Jesse SansFreeman, Mckinnley Smithey M, MD   2 mg at 04/25/20 1246  . magnesium hydroxide (MILK OF MAGNESIA) suspension 30 mL  30 mL Oral Daily PRN Clapacs, [name], MD      . QUEtiapine (SEROQUEL XR) 24 hr tablet 50 mg  50 mg Oral QHS Jesse SansFreeman, Consuelo Suthers M, MD      . risperiDONE (RISPERDAL M-TABS) disintegrating tablet 4 mg  4 mg Oral QHS Jesse SansFreeman, Sadiyah Kangas M, MD      . sodium chloride (OCEAN) 0.65 % nasal spray 2 spray  2 spray Each Nare Q4H PRN  Jesse SansFreeman, Parv Manthey M, MD      . traZODone (DESYREL) tablet 100 mg  100 mg Oral QHS PRN Clapacs, Jackquline DenmarkJohn T, MD   100 mg at 04/23/20 2115    Lab Results:  No results found for this or any previous visit (from the past 48 hour(s)).  Blood Alcohol level:  Lab Results  Component Value Date  ETH <10 04/12/2020   ETH <10 03/03/2018    Metabolic Disorder Labs: Lab Results  Component Value Date   HGBA1C 4.7 (L) 04/13/2020   MPG 88.19 04/13/2020   MPG 93.93 03/03/2018   Lab Results  Component Value Date   PROLACTIN 17.5 05/02/2014   Lab Results  Component Value Date   CHOL 122 04/13/2020   TRIG 50 04/13/2020   HDL 48 04/13/2020   CHOLHDL 2.5 04/13/2020   VLDL 10 04/13/2020   LDLCALC 64 04/13/2020   LDLCALC 90 03/03/2018    Physical Findings: AIMS: Facial and Oral Movements Muscles of Facial Expression: None, normal Lips and Perioral Area: None, normal Jaw: None, normal Tongue: None, normal,Extremity Movements Upper (arms, wrists, hands, fingers): None, normal Lower (legs, knees, ankles, toes): None, normal, Trunk Movements Neck, shoulders, hips: None, normal, Overall Severity Severity of abnormal movements (highest score from questions above): None, normal Incapacitation due to abnormal movements: None, normal Patient's awareness of abnormal movements (rate only patient's report): No Awareness, Dental Status Current problems with teeth and/or dentures?: No  CIWA:  CIWA-Ar Total: 0 COWS:  COWS Total Score: 2  Musculoskeletal: Strength & Muscle Tone: within normal limits Gait & Station: normal Patient leans: N/A  Psychiatric Specialty Exam: Physical Exam Vitals and nursing note reviewed.  Constitutional:      General: She is not in acute distress.    Appearance: Normal appearance.  HENT:     Head: Normocephalic and atraumatic.     Right Ear: External ear normal.     Left Ear: External ear normal.     Nose: Nose normal.     Mouth/Throat:     Mouth: Mucous membranes  are moist.     Pharynx: Oropharynx is clear.  Eyes:     Extraocular Movements: Extraocular movements intact.     Conjunctiva/sclera: Conjunctivae normal.     Pupils: Pupils are equal, round, and reactive to light.  Cardiovascular:     Rate and Rhythm: Normal rate.     Pulses: Normal pulses.  Pulmonary:     Effort: Pulmonary effort is normal.     Breath sounds: Normal breath sounds.  Abdominal:     General: Abdomen is flat.     Palpations: Abdomen is soft.  Musculoskeletal:        General: No swelling. Normal range of motion.     Cervical back: Normal range of motion and neck supple.  Skin:    General: Skin is warm and dry.  Neurological:     General: No focal deficit present.     Mental Status: She is alert and oriented to person, place, and time.  Psychiatric:        Attention and Perception: She is attentive. She does not perceive auditory hallucinations.        Mood and Affect: Mood is depressed. Affect is not tearful.        Speech: Speech normal. Speech is not delayed.        Behavior: Behavior is cooperative.        Thought Content: Thought content is paranoid. Thought content is not delusional. Thought content does not include suicidal ideation.        Cognition and Memory: Cognition is not impaired. Memory is not impaired.        Judgment: Judgment is impulsive.     Review of Systems  Constitutional: Positive for fatigue. Negative for appetite change.  HENT: Negative for rhinorrhea and sore throat.   Eyes: Negative for photophobia  and visual disturbance.  Respiratory: Negative for cough and shortness of breath.   Cardiovascular: Negative for chest pain and palpitations.  Gastrointestinal: Negative for constipation, diarrhea, nausea and vomiting.  Endocrine: Negative for cold intolerance and heat intolerance.  Genitourinary: Negative for difficulty urinating and dysuria.  Musculoskeletal: Negative for arthralgias and gait problem.  Skin: Negative for rash and wound.   Allergic/Immunologic: Negative for food allergies and immunocompromised state.  Neurological: Negative for dizziness and headaches.  Hematological: Negative for adenopathy. Does not bruise/bleed easily.  Psychiatric/Behavioral: Positive for agitation, dysphoric mood, hallucinations and suicidal ideas. The patient is nervous/anxious.     Blood pressure 107/79, pulse (!) 137, temperature 98 F (36.7 C), temperature source Oral, resp. rate 17, height 5\' 11"  (1.803 m), weight 63.5 kg, last menstrual period 03/30/2020, SpO2 94 %.Body mass index is 19.53 kg/m.  General Appearance: Disheveled  Eye Contact:  Fair  Speech:  Slow  Volume:  Normal  Mood:  Depressed  Affect:  Tearful  Thought Process:  Disorganized  Orientation:  Full (Time, Place, and Person)  Thought Content:  Delusions, Hallucinations: Auditory, Paranoid Ideation and Rumination  Suicidal Thoughts:  Yes.  without intent/plan  Homicidal Thoughts:  No  Memory:  Immediate;   Fair Recent;   Fair Remote;   Fair  Judgement:  Impaired  Insight:  Present  Psychomotor Activity:  Restlessness  Concentration:  Concentration: Poor and Attention Span: Poor  Recall:  13/09/2019 of Knowledge:  Poor  Language:  Fair  Akathisia:  Negative  Handed:  Right  AIMS (if indicated):     Assets:  Desire for Improvement Housing Social Support  ADL's:  Impaired  Cognition:  Impaired,  Mild  Sleep:  Number of Hours: 6.75     Treatment Plan Summary: Daily contact with patient to assess and evaluate symptoms and progress in treatment and Medication management PLAN OF CARE:19 year old woman with a historydepression and OCDnot currently in any treatment. Immediate behavior consistent with acute psychosis. Patient is living with her family,not working,not in school,low functioning. As patient's psychosis has began to clear her diagnosis seems most consistent with MDD, recurrent, severe with psychotic features. However,differential  diagnosis continues to includenew onset schizophrenia, bipolar disorder with psychosis. Continue IVC. Seroquel not effective at 450 mg total dose, and have started to transition to Risperdal. Decrease Seroquel to 50 mg in the evening. Continue Risperdal M-tab to 3 mg QHS as this seems to be improving psychosis.  Haldol/Ativan/Bendaryl PRN for acute agitation.   34, MD 04/26/2020, 3:07 PM

## 2020-04-26 NOTE — Progress Notes (Signed)
BRIEF PHARMACY NOTE   This patient attended and participated in Medication Management Group counseling led by [facility] staff pharmacist.  This interactive class reviews basic information about prescription medications and education on personal responsibility in medication management.  The class also includes general knowledge of 3 main classes of behavioral medications, including antipsychotics, antidepressants, and mood stabilizers.     Patient behavior was appropriate for group setting.   Educational materials sourced from:  "Medication Do's and Don'ts" from WWW[employer]MED-PASS.COM   "Mental Health Medications" from National Institute of [employer] Https://www.nimh.nih.gov/health/topics/mental-health-medications/index.shtml#part 149856   Saleem Coccia M Yuvan Medinger, PharmD, BCPS Clinical Pharmacist 04/26/2020 3:01 PM  

## 2020-04-26 NOTE — Plan of Care (Signed)
Patient attended groups and was able to make logical conversation with staff today.Patient stated that she miss her mom a lot.Denies SI,HI and AVH.At about dinner time patient standing at the exit door states " I need to go home and see my mom." Patient started crying loud.Redirected patient back to room after long period of encouragement. Patient could not stop crying. Ativan IM given.Patient had dinner.Patient is on the phone with her mother at this time.

## 2020-04-26 NOTE — Progress Notes (Signed)
Recreation Therapy Notes  Date: 04/26/2020  Time: 9:30 am   Location: Craft room  Behavioral response: Appropriate  Intervention Topic: Social Skills   Discussion/Intervention:  Group content on today was focused on social skills. The group defined social skills and identified ways they use social skills. Patients expressed what obstacles they face when trying to be social. Participants described the importance of social skills. The group listed ways to improve social skills and reasons to improve social skills. Individuals had an opportunity to learn new and improve social skills as well as identify their weaknesses. Clinical Observations/Feedback: Patient came to group and was focus and cooperative. Individual was social with peers and staff while participating in the intervention.  Tzivia Oneil LRT/CTRS         Fedra Lanter 04/26/2020 11:47 AM

## 2020-04-26 NOTE — [facility] Group Notes (Signed)
[facility] Group Notes:  (Nursing/MHT/Case Management/Adjunct)  Date:  04/26/2020  Time:  9:43 PM  Type of Therapy:  Group Therapy  Participation Level:  Did Not Attend  Mayra Neer 04/26/2020, 9:43 PM

## 2020-04-26 NOTE — [facility] Group Notes (Signed)
LCSW Group Therapy Note  04/26/2020 3:03 PM  Type of Therapy/Topic:  Group Therapy:  Balance in Life  Participation Level:  Minimal  Description of Group:    This group will address the concept of balance and how it feels and looks when one is unbalanced. Patients will be encouraged to process areas in their lives that are out of balance and identify reasons for remaining unbalanced. Facilitators will guide patients in utilizing problem-solving interventions to address and correct the stressor making their life unbalanced. Understanding and applying boundaries will be explored and addressed for obtaining and maintaining a balanced life. Patients will be encouraged to explore ways to assertively make their unbalanced needs known to significant others in their lives, using other group members and facilitator for support and feedback.  Therapeutic Goals: 1. Patient will identify two or more emotions or situations they have that consume much of in their lives. 2. Patient will identify signs/triggers that life has become out of balance:  3. Patient will identify two ways to set boundaries in order to achieve balance in their lives:  4. Patient will demonstrate ability to communicate their needs through discussion and/or role plays  Summary of Patient Progress: Patient attended group.  Patient engaged in group discussion and shared that she will find balance by "focusing on what I have to get done".    Therapeutic Modalities:   Cognitive Behavioral Therapy Solution-Focused Therapy Assertiveness Training  Penni Homans MSW, LCSW 04/26/2020 3:03 PM

## 2020-04-27 DIAGNOSIS — F23 Brief psychotic disorder: Secondary | ICD-10-CM | POA: Diagnosis not present

## 2020-04-27 MED ORDER — FLUVOXAMINE MALEATE 50 MG PO TABS: 100.0000 mg | ORAL_TABLET | Freq: Every day | ORAL | Status: DC

## 2020-04-27 MED ORDER — FLUVOXAMINE MALEATE 50 MG PO TABS
50.0000 mg | ORAL_TABLET | Freq: Two times a day (BID) | ORAL | Status: DC
  Administered 2020-04-28 – 2020-05-03 (×11): 50 mg via ORAL
  Filled 2020-04-27 (×11): qty 1

## 2020-04-27 NOTE — Progress Notes (Signed)
[facility] MD Progress Note  04/27/2020 1:53 PM Robin Hess  MRN:  [id number]   Subjective:  19 year old woman brought into the hospital under IVC Patient was brought in by EMS after running away from family running out through the woods with bizarre talking behavior. Reported by EMS that the patient was muttering to herself about being guilty of something or of having killed someone.  Yesterday, patient intermittently confused, disorganized, and crying hysterically. She felt that everyone hated her, and that if she went to sleep her mother would die. She required PRN Ativan. She was medication compliant overnight and this morning.   She was seen one-on-one at bedside today. Her speech is no longer delayed, but she remains very tearful. She continues to feel that everyone hates her at this time. She also feels that people are dying in the world right now because of her. She has suicidal ideations without plan due to these thoughts. She also endorses feeling like her mother might not be real on the phone, and requests a video visit. Mother states she will be available around 3:30-4pm.    Principal Problem: Psychosis (HCC) Diagnosis: Principal Problem:   Psychosis (HCC) Active Problems:   Social anxiety disorder   OCD (obsessive compulsive disorder)  Total Time spent with patient: 1 hour  Past Psychiatric History: Patient has had 2 prior hospitalizations in 2015 and 2019. Last diagnosis was depression and OCD. Did not follow-up with outpatient treatment. Not currently getting any outpatient treatment. Has made suicidal statements in the past. Recently had not been trying to hurt her self or showing any violence to others. Some history of cannabis use in the past but currently does not seem to be an active issue.  Past Medical History:  Past Medical History:  Diagnosis Date  . Allergy   . Anxiety   . Cannabis use disorder, moderate, dependence (HCC)   . Depression   . OCD (obsessive  compulsive disorder)   . Severe major depression, single episode, with psychotic features (HCC)   . Social anxiety disorder    History reviewed. No pertinent surgical history. Family History: History reviewed. No pertinent family history. Family Psychiatric  History: None reported Social History:  Social History   Substance and Sexual Activity  Alcohol Use No     Social History   Substance and Sexual Activity  Drug Use Yes  . Types: Marijuana    Social History   Socioeconomic History  . Marital status: Single    Spouse name: Not on file  . Number of children: Not on file  . Years of education: Not on file  . Highest education level: Not on file  Occupational History  . Not on file  Tobacco Use  . Smoking status: Never Smoker  . Smokeless tobacco: Never Used  Substance and Sexual Activity  . Alcohol use: No  . Drug use: Yes    Types: Marijuana  . Sexual activity: Never    Birth control/protection: None  Other Topics Concern  . Not on file  Social History Narrative  . Not on file   Social Determinants of Health   Financial Resource Strain:   . Difficulty of Paying Living Expenses: Not on file  Food Insecurity:   . Worried About Programme researcher, broadcasting/film/videounning Out of Food in the Last Year: Not on file  . Ran Out of Food in the Last Year: Not on file  Transportation Needs:   . Lack of Transportation (Medical): Not on file  . Lack of Transportation (Non-Medical):  Not on file  Physical Activity:   . Days of Exercise per Week: Not on file  . Minutes of Exercise per Session: Not on file  Stress:   . Feeling of Stress : Not on file  Social Connections:   . Frequency of Communication with Friends and Family: Not on file  . Frequency of Social Gatherings with Friends and Family: Not on file  . Attends Religious Services: Not on file  . Active Member of Clubs or Organizations: Not on file  . Attends Banker Meetings: Not on file  . Marital Status: Not on file   Additional  Social History:                         Sleep: Fair  Appetite:  Fair  Current Medications: Current Facility-Administered Medications  Medication Dose Route Frequency Provider Last Rate Last Admin  . acetaminophen (TYLENOL) tablet 650 mg  650 mg Oral Q6H PRN Clapacs, [name], MD   650 mg at 04/24/20 0818  . alum & mag hydroxide-simeth (MAALOX/MYLANTA) 200-200-20 MG/5ML suspension 30 mL  30 mL Oral Q4H PRN Clapacs, [name], MD      . chlorproMAZINE (THORAZINE) injection 25 mg  25 mg Intramuscular TID PRN [name], MD      . haloperidol (HALDOL) tablet 5 mg  5 mg Oral Q8H PRN [name], MD   5 mg at 04/21/20 0533   And  . LORazepam (ATIVAN) tablet 2 mg  2 mg Oral Q8H PRN [name], MD   2 mg at 04/23/20 2115   And  . diphenhydrAMINE (BENADRYL) capsule 50 mg  50 mg Oral Q8H PRN [name], MD   50 mg at 04/23/20 2115  . feeding supplement (ENSURE ENLIVE / ENSURE PLUS) liquid 237 mL  237 mL Oral BID BM [name], MD   237 mL at 04/27/20 1057  . fluvoxaMINE (LUVOX) tablet 100 mg  100 mg Oral QHS [name], MD      . hydrOXYzine (ATARAX/VISTARIL) tablet 50 mg  50 mg Oral TID PRN Clapacs, [name], MD   50 mg at 04/24/20 0819  . LORazepam (ATIVAN) tablet 2 mg  2 mg Oral Q6H PRN [name], MD   2 mg at 04/25/20 1914   Or  . LORazepam (ATIVAN) injection 2 mg  2 mg Intramuscular Q6H PRN [name], MD   2 mg at 04/26/20 1645  . magnesium hydroxide (MILK OF MAGNESIA) suspension 30 mL  30 mL Oral Daily PRN Clapacs, [name], MD      . risperiDONE (RISPERDAL M-TABS) disintegrating tablet 3 mg  3 mg Oral QHS [name], MD   3 mg at 04/26/20 2122  . sodium chloride (OCEAN) 0.65 % nasal spray 2 spray  2 spray Each Nare Q4H PRN [name], MD      . traZODone (DESYREL) tablet 100 mg  100 mg Oral QHS PRN Clapacs, [name], MD   100 mg at 04/26/20 2123    Lab Results:  No results found for this or any previous visit (from the past 48  hour(s)).  Blood Alcohol level:  Lab Results  Component Value Date   ETH <10 04/12/2020   ETH <10 03/03/2018    Metabolic Disorder Labs: Lab Results  Component Value Date   HGBA1C 4.7 (L) 04/13/2020   MPG 88.19 04/13/2020   MPG 93.93 03/03/2018  Lab Results  Component Value Date   PROLACTIN 17.5 05/02/2014   Lab Results  Component Value Date   CHOL 122 04/13/2020   TRIG 50 04/13/2020   HDL 48 04/13/2020   CHOLHDL 2.5 04/13/2020   VLDL 10 04/13/2020   LDLCALC 64 04/13/2020   LDLCALC 90 03/03/2018    Physical Findings: AIMS: Facial and Oral Movements Muscles of Facial Expression: None, normal Lips and Perioral Area: None, normal Jaw: None, normal Tongue: None, normal,Extremity Movements Upper (arms, wrists, hands, fingers): None, normal Lower (legs, knees, ankles, toes): None, normal, Trunk Movements Neck, shoulders, hips: None, normal, Overall Severity Severity of abnormal movements (highest score from questions above): None, normal Incapacitation due to abnormal movements: None, normal Patient's awareness of abnormal movements (rate only patient's report): No Awareness, Dental Status Current problems with teeth and/or dentures?: No  CIWA:  CIWA-Ar Total: 0 COWS:  COWS Total Score: 2  Musculoskeletal: Strength & Muscle Tone: within normal limits Gait & Station: normal Patient leans: N/A  Psychiatric Specialty Exam: Physical Exam Vitals and nursing note reviewed.  Constitutional:      General: She is not in acute distress.    Appearance: Normal appearance.  HENT:     Head: Normocephalic and atraumatic.     Right Ear: External ear normal.     Left Ear: External ear normal.     Nose: Nose normal.     Mouth/Throat:     Mouth: Mucous membranes are moist.     Pharynx: Oropharynx is clear.  Eyes:     Extraocular Movements: Extraocular movements intact.     Conjunctiva/sclera: Conjunctivae normal.     Pupils: Pupils are equal, round, and reactive to light.   Cardiovascular:     Rate and Rhythm: Normal rate.     Pulses: Normal pulses.  Pulmonary:     Effort: Pulmonary effort is normal.     Breath sounds: Normal breath sounds.  Abdominal:     General: Abdomen is flat.     Palpations: Abdomen is soft.  Musculoskeletal:        General: No swelling. Normal range of motion.     Cervical back: Normal range of motion and neck supple.  Skin:    General: Skin is warm and dry.  Neurological:     General: No focal deficit present.     Mental Status: She is alert and oriented to person, place, and time.  Psychiatric:        Attention and Perception: She is attentive. She does not perceive auditory hallucinations.        Mood and Affect: Mood is depressed. Affect is tearful.        Speech: Speech normal. Speech is not delayed.        Behavior: Behavior is cooperative.        Thought Content: Thought content is paranoid and delusional. Thought content does not include suicidal ideation.        Cognition and Memory: Cognition is not impaired. Memory is not impaired.        Judgment: Judgment is impulsive.     Review of Systems  Constitutional: Positive for fatigue. Negative for appetite change.  HENT: Negative for rhinorrhea and sore throat.   Eyes: Negative for photophobia and visual disturbance.  Respiratory: Negative for cough and shortness of breath.   Cardiovascular: Negative for chest pain and palpitations.  Gastrointestinal: Negative for constipation, diarrhea, nausea and vomiting.  Endocrine: Negative for cold intolerance and heat intolerance.  Genitourinary: Negative  for difficulty urinating and dysuria.  Musculoskeletal: Negative for arthralgias and gait problem.  Skin: Negative for rash and wound.  Allergic/Immunologic: Negative for food allergies and immunocompromised state.  Neurological: Negative for dizziness and headaches.  Hematological: Negative for adenopathy. Does not bruise/bleed easily.  Psychiatric/Behavioral: Positive  for agitation, dysphoric mood, hallucinations and suicidal ideas. The patient is nervous/anxious.     Blood pressure 103/71, pulse (!) 113, temperature 98.4 F (36.9 C), temperature source Oral, resp. rate 18, height 5\' 11"  (1.803 m), weight 63.5 kg, last menstrual period 03/30/2020, SpO2 97 %.Body mass index is 19.53 kg/m.  General Appearance: Disheveled  Eye Contact:  Fair  Speech:  Slow  Volume:  Normal  Mood:  Depressed  Affect:  Tearful  Thought Process:  Disorganized  Orientation:  Full (Time, Place, and Person)  Thought Content:  Delusions, Hallucinations: Auditory, Paranoid Ideation and Rumination  Suicidal Thoughts:  Yes.  without intent/plan  Homicidal Thoughts:  No  Memory:  Immediate;   Fair Recent;   Fair Remote;   Fair  Judgement:  Impaired  Insight:  Present  Psychomotor Activity:  Restlessness  Concentration:  Concentration: Poor and Attention Span: Poor  Recall:  13/09/2019 of Knowledge:  Poor  Language:  Fair  Akathisia:  Negative  Handed:  Right  AIMS (if indicated):     Assets:  Desire for Improvement Housing Social Support  ADL's:  Impaired  Cognition:  Impaired,  Mild  Sleep:  Number of Hours: 8     Treatment Plan Summary: Daily contact with patient to assess and evaluate symptoms and progress in treatment and Medication management PLAN OF CARE:19 year old woman with a historydepression and OCDnot currently in any treatment. Immediate behavior consistent with acute psychosis. Patient is living with her family,not working,not in school,low functioning. As patient's psychosis has began to clear her diagnosis seems most consistent with MDD, recurrent, severe with psychotic features. However,differential diagnosis continues to includenew onset schizophrenia, bipolar disorder with psychosis. Continue IVC. Seroquel not effective at 450 mg total dose, and have titrated off and discontinued. Continue Risperdal M-tab to 3 mg QHS as this seems to be  improving psychosis. Increase Luvox to 50 mg BID. Haldol/Ativan/Bendaryl PRN for acute agitation.   34, MD 04/27/2020, 1:53 PM

## 2020-04-27 NOTE — Progress Notes (Signed)
D: Pt alert and oriented. Pt endorses experiencing anxiety/depression however does not rate them. When asked what was bring on the anxiety/depression pt states "Because I've been ignoring things." Pt reports that she did not sleep well. Pt reports experiencing pain however gives no rating or details as to where the pain is located. Pt was offered pain medication management however refuses and just wanted to rest in bed. Pt denies experiencing any HI, or AVH at this time, however endorses SI. Pt verbally contracts for safety.   This evening pt is pacing and verbalizes anxiety. Pt was given prn vistaril. Pt later heard screaming from room. This Clinical research associate along with security went to check on the pt and found her in the bed crying. When ask why she was crying the pt replied that her family was dead that she didn't have any family. This writer reassured the pt that her family was not dead. The pt proceeded to crying and stated the voice are telling her they are dead. This Clinical research associate offered the pt prn meds. The pt came to the medication room and willingly took the prn meds offered. MD made aware of the situation and the scheduled medication due soon. MD stated scheduled medication can be given once pt wakes up. Pt has been concerned and anxious about family's well being the entire day, wanting to see her parents.  A: Scheduled medications administered to pt, per MD orders. Support and encouragement provided. Frequent verbal contact made. Routine safety checks conducted q15 minutes.   R: No adverse drug reactions noted. Pt verbally contracts for safety at this time. Pt complaint with medications. Pt interacts minimally with others on the unit. Pt remains safe at this time. Will continue to monitor.

## 2020-04-27 NOTE — Progress Notes (Signed)
Patient spent of shift in bed resting. She was cooperative with treatment. She remains sad and depressed. She seemed to rest well through out the night.

## 2020-04-27 NOTE — Progress Notes (Signed)
Recreation Therapy Notes  Date: 04/27/2020  Time: 9:30 am   Location: Craft room     Behavioral response: N/A   Intervention Topic: Happiness   Discussion/Intervention: Patient did not attend group.   Clinical Observations/Feedback:  Patient did not attend group.   Bethsaida Siegenthaler LRT/CTRS           Elyanah Farino 04/27/2020 11:14 AM

## 2020-04-28 DIAGNOSIS — F29 Unspecified psychosis not due to a substance or known physiological condition: Secondary | ICD-10-CM | POA: Diagnosis not present

## 2020-04-28 NOTE — Progress Notes (Signed)
[facility]  04/28/2020 10:28 AM Robin Hess  MRN:  [id number]   Subjective:  and interval hx- Ok"  19 year old woman brought into the hospital under IVC Patient was brought in by EMS after running away from family running out through the woods with bizarre talking behavior. Reported by EMS that the patient was muttering to herself about being guilty of something or of having killed someone.  Patient is seen for follow-up today.  Chart reviewed discussed with nursing staff and the social worker.  Patient reportedly having crying spells at times with help the patient has been cooperative, taking medications, tolerating well.  Patient was lying in her bed, denies any complaints, blunted affect speech is slow with latency, patient slightly anxious,  endorses hearing voices at times telling her she is a bad person, denies suicidal or homicidal thoughts intent or plan.   Principal Problem: Psychosis (HCC) Diagnosis: Principal Problem:   Psychosis (HCC) Active Problems:   Social anxiety disorder   OCD (obsessive compulsive disorder)  Total Time spent with patient: 25 min  Past Psychiatric History: No new information , patient has had 2 prior hospitalizations in 2015 and 2019. Last diagnosis was depression and OCD. Did not follow-up with outpatient treatment. Not currently getting any outpatient treatment. Has made suicidal statements in the past. Recently had not been trying to hurt her self or showing any violence to others. Some history of cannabis use in the past but currently does not seem to be an active issue.  Past Medical History:  Past Medical History:  Diagnosis Date  . Allergy   . Anxiety   . Cannabis use disorder, moderate, dependence (HCC)   . Depression   . OCD (obsessive compulsive disorder)   . Severe major depression, single episode, with psychotic features (HCC)   . Social anxiety disorder    History reviewed. No pertinent surgical history. Family  History: History reviewed. No pertinent family history. Family Psychiatric  History: None reported Social History:  Social History   Substance and Sexual Activity  Alcohol Use No     Social History   Substance and Sexual Activity  Drug Use Yes  . Types: Marijuana    Social History   Socioeconomic History  . Marital status: Single    Spouse name: Not on file  . Number of children: Not on file  . Years of education: Not on file  . Highest education level: Not on file  Occupational History  . Not on file  Tobacco Use  . Smoking status: Never Smoker  . Smokeless tobacco: Never Used  Substance and Sexual Activity  . Alcohol use: No  . Drug use: Yes    Types: Marijuana  . Sexual activity: Never    Birth control/protection: None  Other Topics Concern  . Not on file  Social History Narrative  . Not on file   Social Determinants of Health   Financial Resource Strain:   . Difficulty of Paying Living Expenses: Not on file  Food Insecurity:   . Worried About Programme researcher, broadcasting/film/videounning Out of Food in the Last Year: Not on file  . Ran Out of Food in the Last Year: Not on file  Transportation Needs:   . Lack of Transportation (Medical): Not on file  . Lack of Transportation (Non-Medical): Not on file  Physical Activity:   . Days of Exercise per Week: Not on file  . Minutes of Exercise per Session: Not on file  Stress:   . Feeling of Stress :  Not on file  Social Connections:   . Frequency of Communication with Friends and Family: Not on file  . Frequency of Social Gatherings with Friends and Family: Not on file  . Attends Religious Services: Not on file  . Active Member of Clubs or Organizations: Not on file  . Attends Banker Meetings: Not on file  . Marital Status: Not on file   Additional Social History:                         Sleep: Fair  Appetite:  Fair  Current Medications: Current Facility-Administered Medications  Medication Dose Route Frequency  Provider Last Rate Last Admin  . acetaminophen (TYLENOL) tablet 650 mg  650 mg Oral Q6H PRN Clapacs, [name], MD   650 mg at 04/24/20 0818  . alum & mag hydroxide-simeth (MAALOX/MYLANTA) 200-200-20 MG/5ML suspension 30 mL  30 mL Oral Q4H PRN Clapacs, [name], MD      . chlorproMAZINE (THORAZINE) injection 25 mg  25 mg Intramuscular TID PRN [name], MD      . haloperidol (HALDOL) tablet 5 mg  5 mg Oral Q8H PRN [name], MD   5 mg at 04/27/20 1627   And  . LORazepam (ATIVAN) tablet 2 mg  2 mg Oral Q8H PRN [name], MD   2 mg at 04/27/20 1627   And  . diphenhydrAMINE (BENADRYL) capsule 50 mg  50 mg Oral Q8H PRN [name], MD   50 mg at 04/27/20 1627  . feeding supplement (ENSURE ENLIVE / ENSURE PLUS) liquid 237 mL  237 mL Oral BID BM [name], MD   237 mL at 04/27/20 1646  . fluvoxaMINE (LUVOX) tablet 50 mg  50 mg Oral BID [name], MD   50 mg at 04/28/20 0902  . hydrOXYzine (ATARAX/VISTARIL) tablet 50 mg  50 mg Oral TID PRN Clapacs, [name], MD   50 mg at 04/27/20 1601  . LORazepam (ATIVAN) tablet 2 mg  2 mg Oral Q6H PRN [name], MD   2 mg at 04/25/20 1914   Or  . LORazepam (ATIVAN) injection 2 mg  2 mg Intramuscular Q6H PRN [name], MD   2 mg at 04/26/20 1645  . magnesium hydroxide (MILK OF MAGNESIA) suspension 30 mL  30 mL Oral Daily PRN Clapacs, [name], MD      . risperiDONE (RISPERDAL M-TABS) disintegrating tablet 3 mg  3 mg Oral QHS [name], MD   3 mg at 04/26/20 2122  . sodium chloride (OCEAN) 0.65 % nasal spray 2 spray  2 spray Each Nare Q4H PRN [name], MD      . traZODone (DESYREL) tablet 100 mg  100 mg Oral QHS PRN Clapacs, [name], MD   100 mg at 04/26/20 2123    Lab Results:  No results found for this or any previous visit (from the past 48 hour(s)).  Blood Alcohol level:  Lab Results  Component Value Date   [facility] <10 04/12/2020   ETH <10 03/03/2018    Metabolic Disorder Labs: Lab Results   Component Value Date   HGBA1C 4.7 (L) 04/13/2020   MPG 88.19 04/13/2020   MPG 93.93 03/03/2018   Lab Results  Component Value Date   PROLACTIN 17.5 05/02/2014   Lab Results  Component Value Date   CHOL 122 04/13/2020   TRIG 50 04/13/2020   HDL 48  04/13/2020   CHOLHDL 2.5 04/13/2020   VLDL 10 04/13/2020   LDLCALC 64 04/13/2020   LDLCALC 90 03/03/2018    Physical Findings: AIMS: Facial and Oral Movements Muscles of Facial Expression: None, normal Lips and Perioral Area: None, normal Jaw: None, normal Tongue: None, normal,Extremity Movements Upper (arms, wrists, hands, fingers): None, normal Lower (legs, knees, ankles, toes): None, normal, Trunk Movements Neck, shoulders, hips: None, normal, Overall Severity Severity of abnormal movements (highest score from questions above): None, normal Incapacitation due to abnormal movements: None, normal Patient's awareness of abnormal movements (rate only patient's report): No Awareness, Dental Status Current problems with teeth and/or dentures?: No  CIWA:  CIWA-Ar Total: 0 COWS:  COWS Total Score: 2  Musculoskeletal: Strength & Muscle Tone: within normal limits Gait & Station: normal Patient leans: N/A  Psychiatric Specialty Exam: Physical Exam Vitals and nursing note reviewed.  Constitutional:      General: She is not in acute distress.    Appearance: Normal appearance.  HENT:     Head: Normocephalic and atraumatic.     Right Ear: External ear normal.     Left Ear: External ear normal.     Nose: Nose normal.     Mouth/Throat:     Mouth: Mucous membranes are moist.     Pharynx: Oropharynx is clear.  Eyes:     Extraocular Movements: Extraocular movements intact.     Conjunctiva/sclera: Conjunctivae normal.  Cardiovascular:     Rate and Rhythm: Normal rate.     Pulses: Normal pulses.  Pulmonary:     Effort: Pulmonary effort is normal.     Breath sounds: Normal breath sounds.  Abdominal:     General: Abdomen is  flat.  Musculoskeletal:        General: No swelling. Normal range of motion.     Cervical back: Normal range of motion.  Neurological:     General: No focal deficit present.     Mental Status: She is alert.  Psychiatric:        Attention and Perception: She is attentive. She does not perceive auditory hallucinations.        Mood and Affect: Mood is depressed. Affect is tearful.        Speech: Speech normal. Speech is not delayed.        Behavior: Behavior is cooperative.        Thought Content: Thought content is paranoid and delusional. Thought content does not include suicidal ideation.        Cognition and Memory: Cognition is not impaired. Memory is not impaired.        Judgment: Judgment is impulsive.     Review of Systems  Constitutional: Positive for fatigue. Negative for appetite change.  HENT: Negative for rhinorrhea and sore throat.   Eyes: Negative for photophobia and visual disturbance.  Respiratory: Negative for cough and shortness of breath.   Cardiovascular: Negative for chest pain and palpitations.  Gastrointestinal: Negative for constipation, diarrhea, nausea and vomiting.  Endocrine: Negative for cold intolerance and heat intolerance.  Genitourinary: Negative for difficulty urinating and dysuria.  Musculoskeletal: Negative for arthralgias and gait problem.  Skin: Negative for rash and wound.  Allergic/Immunologic: Negative for food allergies and immunocompromised state.  Neurological: Negative for dizziness and headaches.  Hematological: Negative for adenopathy. Does not bruise/bleed easily.  Psychiatric/Behavioral: Positive for agitation, dysphoric mood, hallucinations and suicidal ideas. The patient is nervous/anxious.     Blood pressure 100/71, pulse 94, temperature 99.2 F (37.3 C), temperature  source Oral, resp. rate 20, height 5\' 11"  (1.803 m), weight 63.5 kg, last menstrual period 03/30/2020, SpO2 98 %.Body mass index is 19.53 kg/m.  General Appearance:  Disheveled  Eye Contact:  Fair  Speech:  Slow  Volume:  Normal  Mood:  Depressed  Affect:  Blunted, anxious  Thought Process:  Disorganized  Orientation:  Full (Time, Place, and Person)  Thought Content:  Delusions, Hallucinations: Auditory, Paranoid Ideation and Rumination, AH telling her she is a bad person  Suicidal Thoughts:  Yes.  without intent/plan  Homicidal Thoughts:  No  Memory:  Immediate;   Fair Recent;   Fair Remote;   Fair  Judgement:  Impaired  Insight:  Present  Psychomotor Activity:  Restlessness  Concentration:  Concentration: Poor and Attention Span: Poor  Recall:  13/09/2019 of Knowledge:  Poor  Language:  Fair  Akathisia:  Negative  Handed:  Right  AIMS (if indicated):     Assets:  Desire for Improvement Housing Social Support  ADL's:  Impaired  Cognition:  Impaired,  Mild  Sleep:  Number of Hours: 8.25     Treatment Plan Summary: Daily contact with patient to assess and evaluate symptoms and progress in treatment and Medication management PLAN OF CARE:18 year old woman with a historydepression and OCDnot currently in any treatment. Immediate behavior consistent with acute psychosis. Patient is living with her family,not working,not in school,low functioning. As patient's psychosis has began to clear her diagnosis seems most consistent with MDD, recurrent, severe with psychotic features. However,differential diagnosis continues to includenew onset schizophrenia, bipolar disorder with psychosis.  Plan- Continue Risperdal 3 mg for psychosis, QTC 459, Continue Lovenox 50 mg twice daily for depression anxiety and OCD.  Seroquel not effective at 450 mg total dose, and have titrated off and discontinued. Continue Risperdal M-tab to 3 mg QHS as this seems to be improving psychosis. Haldol/Ativan/Bendaryl PRN for acute agitation.  Group and milieu treatment.  36, MD 04/28/2020, 10:28 AMPatient ID: [id number], female   DOB: 30-Oct-2000,  19 y.o.   MRN: [id number]

## 2020-04-28 NOTE — [facility] Group Notes (Signed)
[facility] Group Notes: (Clinical Social Work)   04/28/2020      Type of Therapy:  Group Therapy   Participation Level:  Did Not Attend - was invited individually by Nurse/MHT and chose not to attend.   Susa Simmonds, LCSWA 04/28/2020  4:22 PM

## 2020-04-28 NOTE — Progress Notes (Signed)
Patient alert and oriented x 3,she appears less anxious, affect is blunted thoughts are disorganized she isolated most of the shift to her room, she was heavily sedated so didn't take night medication, no distress noted will continue to monitor.

## 2020-04-28 NOTE — Tx Team (Signed)
Interdisciplinary Treatment and Diagnostic Plan Update  04/28/2020 Time of Session: 10:00 AM  Valerie Cones MRN: [id number]  Principal Diagnosis: Psychosis [facility])  Secondary Diagnoses: Principal Problem:   Psychosis (HCC) Active Problems:   Social anxiety disorder   OCD (obsessive compulsive disorder)   Current Medications:  Current Facility-Administered Medications  Medication Dose Route Frequency Provider Last Rate Last Admin  . acetaminophen (TYLENOL) tablet 650 mg  650 mg Oral Q6H PRN Clapacs, [name], MD   650 mg at 04/24/20 0818  . alum & mag hydroxide-simeth (MAALOX/MYLANTA) 200-200-20 MG/5ML suspension 30 mL  30 mL Oral Q4H PRN Clapacs, [name], MD      . chlorproMAZINE (THORAZINE) injection 25 mg  25 mg Intramuscular TID PRN [name], MD      . haloperidol (HALDOL) tablet 5 mg  5 mg Oral Q8H PRN [name], MD   5 mg at 04/27/20 1627   And  . LORazepam (ATIVAN) tablet 2 mg  2 mg Oral Q8H PRN [name], MD   2 mg at 04/27/20 1627   And  . diphenhydrAMINE (BENADRYL) capsule 50 mg  50 mg Oral Q8H PRN [name], MD   50 mg at 04/27/20 1627  . feeding supplement (ENSURE ENLIVE / ENSURE PLUS) liquid 237 mL  237 mL Oral BID BM [name], MD   237 mL at 04/27/20 1646  . fluvoxaMINE (LUVOX) tablet 50 mg  50 mg Oral BID [name], MD   50 mg at 04/28/20 0902  . hydrOXYzine (ATARAX/VISTARIL) tablet 50 mg  50 mg Oral TID PRN Clapacs, [name], MD   50 mg at 04/27/20 1601  . LORazepam (ATIVAN) tablet 2 mg  2 mg Oral Q6H PRN [name], MD   2 mg at 04/25/20 1914   Or  . LORazepam (ATIVAN) injection 2 mg  2 mg Intramuscular Q6H PRN [name], MD   2 mg at 04/26/20 1645  . magnesium hydroxide (MILK OF MAGNESIA) suspension 30 mL  30 mL Oral Daily PRN Clapacs, [name], MD      . risperiDONE (RISPERDAL M-TABS) disintegrating tablet 3 mg  3 mg Oral QHS [name], MD   3 mg at 04/26/20 2122  . sodium chloride (OCEAN) 0.65 % nasal spray 2  spray  2 spray Each Nare Q4H PRN [name], MD      . traZODone (DESYREL) tablet 100 mg  100 mg Oral QHS PRN Clapacs, [name], MD   100 mg at 04/26/20 2123   PTA Medications: Medications Prior to Admission  Medication Sig Dispense Refill Last Dose  . fluvoxaMINE (LUVOX) 100 MG tablet Take 1 tablet (100 mg total) by mouth at bedtime. (Patient not taking: Reported on 04/12/2020) 30 tablet 1   . hydrOXYzine (ATARAX/VISTARIL) 50 MG tablet Take 1 tablet (50 mg total) by mouth 3 (three) times daily as needed for anxiety. (Patient not taking: Reported on 04/12/2020) 90 tablet 1   . lamoTRIgine (LAMICTAL) 25 MG tablet Take 1 tablet (25 mg total) by mouth at bedtime. Take 1 tab for two weeks, 2 tabs for 2 weeks, 4 tabs for 2 weeks, 8 tabs or 200 mg after (Patient not taking: Reported on 04/12/2020) 45 tablet 1   . prazosin (MINIPRESS) 1 MG capsule Take 1 capsule (1 mg total) by mouth 2 (two) times daily. (Patient not taking: Reported on 04/12/2020) 60 capsule 1   . traZODone (DESYREL) 100 MG tablet Take  1 tablet (100 mg total) by mouth at bedtime as needed for sleep. (Patient not taking: Reported on 04/12/2020) 30 tablet 1     Patient Stressors:    Patient Strengths:    Treatment Modalities: Medication Management, Group therapy, Case management,  1 to 1 session with clinician, Psychoeducation, Recreational therapy.   Physician Treatment Plan for Primary Diagnosis: Psychosis (HCC) Long Term Goal(s): Improvement in symptoms so as ready for discharge Improvement in symptoms so as ready for discharge   Short Term Goals: Ability to identify changes in lifestyle to reduce recurrence of condition will improve Ability to verbalize feelings will improve Ability to disclose and discuss suicidal ideas Ability to demonstrate self-control will improve Ability to identify and develop effective coping behaviors will improve Compliance with prescribed medications will improve Ability to identify changes  in lifestyle to reduce recurrence of condition will improve Ability to verbalize feelings will improve Ability to disclose and discuss suicidal ideas Ability to demonstrate self-control will improve Ability to identify and develop effective coping behaviors will improve Compliance with prescribed medications will improve  Medication Management: Evaluate patient's response, side effects, and tolerance of medication regimen.  Therapeutic Interventions: 1 to 1 sessions, Unit Group sessions and Medication administration.  Evaluation of Outcomes: Not Progressing  Physician Treatment Plan for Secondary Diagnosis: Principal Problem:   Psychosis (HCC) Active Problems:   Social anxiety disorder   OCD (obsessive compulsive disorder)  Long Term Goal(s): Improvement in symptoms so as ready for discharge Improvement in symptoms so as ready for discharge   Short Term Goals: Ability to identify changes in lifestyle to reduce recurrence of condition will improve Ability to verbalize feelings will improve Ability to disclose and discuss suicidal ideas Ability to demonstrate self-control will improve Ability to identify and develop effective coping behaviors will improve Compliance with prescribed medications will improve Ability to identify changes in lifestyle to reduce recurrence of condition will improve Ability to verbalize feelings will improve Ability to disclose and discuss suicidal ideas Ability to demonstrate self-control will improve Ability to identify and develop effective coping behaviors will improve Compliance with prescribed medications will improve     Medication Management: Evaluate patient's response, side effects, and tolerance of medication regimen.  Therapeutic Interventions: 1 to 1 sessions, Unit Group sessions and Medication administration.  Evaluation of Outcomes: Not Progressing   RN Treatment Plan for Primary Diagnosis: Psychosis (HCC) Long Term Goal(s): Knowledge  of disease and therapeutic regimen to maintain health will improve  Short Term Goals: Ability to verbalize frustration and anger appropriately will improve, Ability to participate in decision making will improve, Ability to verbalize feelings will improve, Ability to identify and develop effective coping behaviors will improve and Compliance with prescribed medications will improve  Medication Management: RN will administer medications as ordered by provider, will assess and evaluate patient's response and provide education to patient for prescribed medication. RN will report any adverse and/or side effects to prescribing provider.  Therapeutic Interventions: 1 on 1 counseling sessions, Psychoeducation, Medication administration, Evaluate responses to treatment, Monitor vital signs and CBGs as ordered, Perform/monitor CIWA, COWS, AIMS and Fall Risk screenings as ordered, Perform wound care treatments as ordered.  Evaluation of Outcomes: Not Progressing   LCSW Treatment Plan for Primary Diagnosis: Psychosis (HCC) Long Term Goal(s): Safe transition to appropriate next level of care at discharge, Engage patient in therapeutic group addressing interpersonal concerns.  Short Term Goals: Engage patient in aftercare planning with referrals and resources, Increase social support, Increase ability to appropriately verbalize  feelings, Facilitate acceptance of mental health diagnosis and concerns, Identify triggers associated with mental health/substance abuse issues and Increase skills for wellness and recovery  Therapeutic Interventions: Assess for all discharge needs, 1 to 1 time with Social worker, Explore available resources and support systems, Assess for adequacy in community support network, Educate family and significant other(s) on suicide prevention, Complete Psychosocial Assessment, Interpersonal group therapy.  Evaluation of Outcomes: Not Progressing   Progress in Treatment: Attending groups:  No. Participating in groups: No. Taking medication as prescribed: Yes. Toleration medication: Yes. Family/Significant other contact made: Yes, individual(s) contacted:  Ernestina Patches, Father  Patient understands diagnosis: No. Discussing patient identified problems/goals with staff: No. Medical problems stabilized or resolved: Yes. Denies suicidal/homicidal ideation: Yes. Issues/concerns per patient self-inventory: No. Other: None  New problem(s) identified: No, Describe:  None   New Short Term/Long Term Goal(s): Elimination of symptoms of psychosis, medication management for mood stabilization; elimination of SI thoughts; development of comprehensive mental wellness/sobriety plan. Update (04/18/20) Patient was able to have brief (30 minutes) conversation with physician. Update 04/23/20: No changes at this time. Update 04/28/2020: No changes at this time   Patient Goals:  "Work on myself." "Update (04/18/20) nothing significant to report. Update 04/23/20: No changes at this time. Update 04/28/20: No changes at this time   Discharge Plan or Barriers:  CSW will assist with aftercare planning as needed. Update 04/23/20: No changes at this time. Update 04/28/2020: No changes at this time   Reason for Continuation of Hospitalization: Anxiety Delusions  Depression Hallucinations Medication stabilization  Estimated Length of Stay: TBD   Attendees: Patient: 04/28/2020 12:03 PM  Physician: Dr. Joseph Art, MD 04/28/2020 12:03 PM  Nursing:  04/28/2020 12:03 PM  RN Care Manager: 04/28/2020 12:03 PM  Social Worker: Susa Simmonds, LCSWA 04/28/2020 12:03 PM  Recreational Therapist:  04/28/2020 12:03 PM  Other:  04/28/2020 12:03 PM  Other:  04/28/2020 12:03 PM  Other: 04/28/2020 12:03 PM    Scribe for Treatment Team: Susa Simmonds, LCSWA 04/28/2020 12:03 PM

## 2020-04-29 DIAGNOSIS — F29 Unspecified psychosis not due to a substance or known physiological condition: Secondary | ICD-10-CM | POA: Diagnosis not present

## 2020-04-29 NOTE — Progress Notes (Signed)
[facility] MD Progress Note  04/29/2020 11:07 AM Robin Hess  MRN:  [id number]   Subjective:  and interval hx- Ok"  19 year old woman brought into the hospital under IVC Patient was brought in by EMS after running away from family running out through the woods with bizarre talking behavior. Reported by EMS that the patient was muttering to herself about being guilty of something or of having killed someone.  Patient is seen for follow-up today.  Chart reviewed discussed with nursing staff .  Patient reportedly having crying spells at times with help the patient has been cooperative, taking medications, tolerating well.  Patient was eating her breakfast in the day room,  denies any complaints, blunted affect speech is slow with latency, poor eye contact,  patient  anxious, denies hallucination,  denies suicidal or homicidal thoughts intent or plan.   Principal Problem: Psychosis (HCC) Diagnosis: Principal Problem:   Psychosis (HCC) Active Problems:   Social anxiety disorder   OCD (obsessive compulsive disorder)  Total Time spent with patient: 25 min  Past Psychiatric History: No new information , patient has had 2 prior hospitalizations in 2015 and 2019. Last diagnosis was depression and OCD. Did not follow-up with outpatient treatment. Not currently getting any outpatient treatment. Has made suicidal statements in the past. Recently had not been trying to hurt her self or showing any violence to others. Some history of cannabis use in the past but currently does not seem to be an active issue.  Past Medical History:  Past Medical History:  Diagnosis Date  . Allergy   . Anxiety   . Cannabis use disorder, moderate, dependence (HCC)   . Depression   . OCD (obsessive compulsive disorder)   . Severe major depression, single episode, with psychotic features (HCC)   . Social anxiety disorder    History reviewed. No pertinent surgical history. Family History: History reviewed. No  pertinent family history. Family Psychiatric  History: None reported Social History:  Social History   Substance and Sexual Activity  Alcohol Use No     Social History   Substance and Sexual Activity  Drug Use Yes  . Types: Marijuana    Social History   Socioeconomic History  . Marital status: Single    Spouse name: Not on file  . Number of children: Not on file  . Years of education: Not on file  . Highest education level: Not on file  Occupational History  . Not on file  Tobacco Use  . Smoking status: Never Smoker  . Smokeless tobacco: Never Used  Substance and Sexual Activity  . Alcohol use: No  . Drug use: Yes    Types: Marijuana  . Sexual activity: Never    Birth control/protection: None  Other Topics Concern  . Not on file  Social History Narrative  . Not on file   Social Determinants of Health   Financial Resource Strain:   . Difficulty of Paying Living Expenses: Not on file  Food Insecurity:   . Worried About Programme researcher, broadcasting/film/video in the Last Year: Not on file  . Ran Out of Food in the Last Year: Not on file  Transportation Needs:   . Lack of Transportation (Medical): Not on file  . Lack of Transportation (Non-Medical): Not on file  Physical Activity:   . Days of Exercise per Week: Not on file  . Minutes of Exercise per Session: Not on file  Stress:   . Feeling of Stress : Not on file  Social Connections:   . Frequency of Communication with Friends and Family: Not on file  . Frequency of Social Gatherings with Friends and Family: Not on file  . Attends Religious Services: Not on file  . Active Member of Clubs or Organizations: Not on file  . Attends Banker Meetings: Not on file  . Marital Status: Not on file   Additional Social History:                         Sleep: Fair  Appetite:  Fair  Current Medications: Current Facility-Administered Medications  Medication Dose Route Frequency Provider Last Rate Last Admin  .  acetaminophen (TYLENOL) tablet 650 mg  650 mg Oral Q6H PRN Clapacs, [name], MD   650 mg at 04/24/20 0818  . alum & mag hydroxide-simeth (MAALOX/MYLANTA) 200-200-20 MG/5ML suspension 30 mL  30 mL Oral Q4H PRN Clapacs, [name], MD      . chlorproMAZINE (THORAZINE) injection 25 mg  25 mg Intramuscular TID PRN [name], MD      . haloperidol (HALDOL) tablet 5 mg  5 mg Oral Q8H PRN [name], MD   5 mg at 04/27/20 1627   And  . LORazepam (ATIVAN) tablet 2 mg  2 mg Oral Q8H PRN [name], MD   2 mg at 04/29/20 1058   And  . diphenhydrAMINE (BENADRYL) capsule 50 mg  50 mg Oral Q8H PRN [name], MD   50 mg at 04/27/20 1627  . feeding supplement (ENSURE ENLIVE / ENSURE PLUS) liquid 237 mL  237 mL Oral BID BM [name], MD   237 mL at 04/29/20 0945  . fluvoxaMINE (LUVOX) tablet 50 mg  50 mg Oral BID [name], MD   50 mg at 04/29/20 0945  . hydrOXYzine (ATARAX/VISTARIL) tablet 50 mg  50 mg Oral TID PRN Clapacs, [name], MD   50 mg at 04/27/20 1601  . LORazepam (ATIVAN) tablet 2 mg  2 mg Oral Q6H PRN [name], MD   2 mg at 04/25/20 1914   Or  . LORazepam (ATIVAN) injection 2 mg  2 mg Intramuscular Q6H PRN [name], MD   2 mg at 04/26/20 1645  . magnesium hydroxide (MILK OF MAGNESIA) suspension 30 mL  30 mL Oral Daily PRN Clapacs, [name], MD      . risperiDONE (RISPERDAL M-TABS) disintegrating tablet 3 mg  3 mg Oral QHS [name], MD   3 mg at 04/26/20 2122  . sodium chloride (OCEAN) 0.65 % nasal spray 2 spray  2 spray Each Nare Q4H PRN [name], MD      . traZODone (DESYREL) tablet 100 mg  100 mg Oral QHS PRN Clapacs, [name], MD   100 mg at 04/26/20 2123    Lab Results:  No results found for this or any previous visit (from the past 48 hour(s)).  Blood Alcohol level:  Lab Results  Component Value Date   [facility] <10 04/12/2020   ETH <10 03/03/2018    Metabolic Disorder Labs: Lab Results  Component Value Date   HGBA1C 4.7 (L)  04/13/2020   MPG 88.19 04/13/2020   MPG 93.93 03/03/2018   Lab Results  Component Value Date   PROLACTIN 17.5 05/02/2014   Lab Results  Component Value Date   CHOL 122 04/13/2020   TRIG 50 04/13/2020   HDL 48 04/13/2020   CHOLHDL  2.5 04/13/2020   VLDL 10 04/13/2020   LDLCALC 64 04/13/2020   LDLCALC 90 03/03/2018    Physical Findings: AIMS: Facial and Oral Movements Muscles of Facial Expression: None, normal Lips and Perioral Area: None, normal Jaw: None, normal Tongue: None, normal,Extremity Movements Upper (arms, wrists, hands, fingers): None, normal Lower (legs, knees, ankles, toes): None, normal, Trunk Movements Neck, shoulders, hips: None, normal, Overall Severity Severity of abnormal movements (highest score from questions above): None, normal Incapacitation due to abnormal movements: None, normal Patient's awareness of abnormal movements (rate only patient's report): No Awareness, Dental Status Current problems with teeth and/or dentures?: No  CIWA:  CIWA-Ar Total: 0 COWS:  COWS Total Score: 2  Musculoskeletal: Strength & Muscle Tone: within normal limits Gait & Station: normal Patient leans: N/A  Psychiatric Specialty Exam: Physical Exam Vitals and nursing note reviewed.  Constitutional:      General: She is not in acute distress.    Appearance: Normal appearance.  HENT:     Head: Normocephalic and atraumatic.     Right Ear: External ear normal.     Left Ear: External ear normal.     Nose: Nose normal.     Mouth/Throat:     Mouth: Mucous membranes are moist.     Pharynx: Oropharynx is clear.  Eyes:     Extraocular Movements: Extraocular movements intact.     Conjunctiva/sclera: Conjunctivae normal.  Cardiovascular:     Rate and Rhythm: Normal rate.     Pulses: Normal pulses.  Pulmonary:     Effort: Pulmonary effort is normal.     Breath sounds: Normal breath sounds.  Abdominal:     General: Abdomen is flat.  Musculoskeletal:        General: No  swelling. Normal range of motion.     Cervical back: Normal range of motion.  Neurological:     General: No focal deficit present.     Mental Status: She is alert.  Psychiatric:        Attention and Perception: She is attentive. She does not perceive auditory hallucinations.        Mood and Affect: Mood is depressed. Affect is tearful.        Speech: Speech normal. Speech is not delayed.        Behavior: Behavior is cooperative.        Thought Content: Thought content is paranoid and delusional. Thought content does not include suicidal ideation.        Cognition and Memory: Cognition is not impaired. Memory is not impaired.        Judgment: Judgment is impulsive.     Review of Systems  Constitutional: Positive for fatigue. Negative for appetite change.  HENT: Negative for rhinorrhea and sore throat.   Eyes: Negative for photophobia and visual disturbance.  Respiratory: Negative for cough and shortness of breath.   Cardiovascular: Negative for chest pain and palpitations.  Gastrointestinal: Negative for constipation, diarrhea, nausea and vomiting.  Endocrine: Negative for cold intolerance and heat intolerance.  Genitourinary: Negative for difficulty urinating and dysuria.  Musculoskeletal: Negative for arthralgias and gait problem.  Skin: Negative for rash and wound.  Allergic/Immunologic: Negative for food allergies and immunocompromised state.  Neurological: Negative for dizziness and headaches.  Hematological: Negative for adenopathy. Does not bruise/bleed easily.  Psychiatric/Behavioral: Positive for agitation, dysphoric mood, hallucinations and suicidal ideas. The patient is nervous/anxious.     Blood pressure 114/78, pulse 82, temperature 97.9 F (36.6 C), temperature source Oral, resp. rate  16, height 5\' 11"  (1.803 m), weight 63.5 kg, last menstrual period 03/30/2020, SpO2 99 %.Body mass index is 19.53 kg/m.  General Appearance: Disheveled  Eye Contact:  Fair  Speech:  Slow   Volume:  Normal  Mood:  Depressed  Affect:  Blunted, anxious  Thought Process:  Disorganized  Orientation:  Full (Time, Place, and Person)  Thought Content:  Delusions, Hallucinations: Auditory, Paranoid Ideation and Rumination, AH at times  Suicidal Thoughts:  Yes.  without intent/plan  Homicidal Thoughts:  No  Memory:  Immediate;   Fair Recent;   Fair Remote;   Fair  Judgement:  Impaired  Insight:  Present  Psychomotor Activity:  Restlessness  Concentration:  Concentration: Poor and Attention Span: Poor  Recall:  13/09/2019 of Knowledge:  Poor  Language:  Fair  Akathisia:  Negative  Handed:  Right  AIMS (if indicated):     Assets:  Desire for Improvement Housing Social Support  ADL's:  Impaired  Cognition:  Impaired,  Mild  Sleep:  Number of Hours: 8.25     Treatment Plan Summary: Daily contact with patient to assess and evaluate symptoms and progress in treatment and Medication management PLAN OF CARE:19 year old woman with a historydepression and OCDnot currently in any treatment. Immediate behavior consistent with acute psychosis. Patient is living with her family,not working,not in school,low functioning. As patient's psychosis has began to clear her diagnosis seems most consistent with MDD, recurrent, severe with psychotic features. However,differential diagnosis continues to includenew onset schizophrenia, bipolar disorder with psychosis.  Plan-patient continues to be psychotic,  Continue Risperdal 3 mg for psychosis, QTC 459, Continue Lovenox 50 mg twice daily for depression anxiety and OCD.  Seroquel not effective at 450 mg total dose, and have titrated off and discontinued. Continue  Haldol/Ativan/Bendaryl PRN for acute agitation.  Group and milieu treatment.  36, MD 04/29/2020, 11:07 AMPatient ID: [id number]/2019, female   DOB: 07-Feb-2001, 19 y.o.   MRN: [id number] Patient ID: Robin Hess, female   DOB: [birth year], 19 y.o.   MRN:  [id number]

## 2020-04-29 NOTE — Progress Notes (Signed)
Patient is still eating breakfast, this writer will administer morning medication once patient is available.

## 2020-04-29 NOTE — Plan of Care (Signed)
D- Patient alert and oriented. Patient presents in a pleasant mood on assessment stating that she slept "pretty ok" last night and had no complaints to voice to this Clinical research associate. Patient denies SI, HI, AVH, and pain at this time. Patient also denies any signs/symptoms of depression and anxiety stating "I feel ok". Patient had no stated goals for today, however, she has asked about going outside to the courtyard.   A- Scheduled medications administered to patient, per MD orders. Support and encouragement provided.  Routine safety checks conducted every 15 minutes.  Patient informed to notify staff with problems or concerns.  R- No adverse drug reactions noted. Patient contracts for safety at this time. Patient compliant with medications and treatment plan. Patient receptive, calm, and cooperative. Patient interacts well with others on the unit.  Patient remains safe at this time.  Problem: Consults Goal: Concurrent Medical Patient Education Description: (See Patient Education Module for education specifics) Outcome: Progressing   Problem: [facility] Concurrent Medical Problem Goal: LTG-Pt will be physically stable and he/significant other Description: (Patient will be physically stable and he/significant other will be able to verbalize understanding of follow-up care and symptoms that would warrant further treatment) Outcome: Progressing Goal: STG-Vital signs will be within defined limits or stabilized Description: (STG- Vital signs will be within defined limits or stabilized for individual) Outcome: Progressing Goal: STG-Compliance with medication and/or treatment as ordered Description: (STG-Compliance with medication and/or treatment as ordered by MD) Outcome: Progressing Goal: STG-Verbalize two symptoms that would warrant further Description: (STG-Verbalize two symptoms that would warrant further treatment) Outcome: Progressing Goal: STG-Patient will participate in  management/stabilization Description: (STG-Patient will participate in management/stabilization of medical condition) Outcome: Progressing Goal: STG-Other (Specify): Description: STG-Other Concurrent Medical (Specify): Outcome: Progressing   Problem: Education: Goal: Utilization of techniques to improve thought processes will improve Outcome: Progressing Goal: Knowledge of the prescribed therapeutic regimen will improve Outcome: Progressing   Problem: Activity: Goal: Interest or engagement in leisure activities will improve Outcome: Progressing Goal: Imbalance in normal sleep/wake cycle will improve Outcome: Progressing   Problem: Coping: Goal: Coping ability will improve Outcome: Progressing Goal: Will verbalize feelings Outcome: Progressing   Problem: Health Behavior/Discharge Planning: Goal: Ability to make decisions will improve Outcome: Progressing Goal: Compliance with therapeutic regimen will improve Outcome: Progressing   Problem: Role Relationship: Goal: Will demonstrate positive changes in social behaviors and relationships Outcome: Progressing   Problem: Safety: Goal: Ability to disclose and discuss suicidal ideas will improve Outcome: Progressing Goal: Ability to identify and utilize support systems that promote safety will improve Outcome: Progressing   Problem: Self-Concept: Goal: Will verbalize positive feelings about self Outcome: Progressing Goal: Level of anxiety will decrease Outcome: Progressing   Problem: Education: Goal: Knowledge of [facility] Education information/materials will improve Outcome: Progressing Goal: Emotional status will improve Outcome: Progressing Goal: Mental status will improve Outcome: Progressing Goal: Verbalization of understanding the information provided will improve Outcome: Progressing

## 2020-04-29 NOTE — Progress Notes (Signed)
Patient came in from outside, crying, stating that she killed her whole family by telling the truth. This Clinical research associate tried to redirect/reassure patient that her mother called this morning, wanting to speak to her. Patient was very tearful and did not believe this Clinical research associate. Patient was given PRN medication for anxiety/agitation.

## 2020-04-29 NOTE — Progress Notes (Signed)
Previous PRN interventions were effective. Patient is now down in the dayroom eating dinner, without any issues at this time.

## 2020-04-29 NOTE — Plan of Care (Signed)
  Problem: Self-Concept: Goal: Level of anxiety will decrease Outcome: Progressing   

## 2020-04-29 NOTE — Progress Notes (Signed)
Patient is going around to different doors on the unit trying to get out. This writer went to ask patient if she needed something, and she stated "no, I'm ok".

## 2020-04-29 NOTE — Progress Notes (Signed)
Patient slept throughout shift in no distress. No interaction with peers. Pt remains safe on unit with q 15 min checks.

## 2020-04-29 NOTE — [facility] Group Notes (Signed)
LCSW Aftercare Discharge Planning Group Note   04/29/2020 1:25 PM- 2:06 PM   Type of Group and Topic: Psychoeducational Group:  Discharge Planning  Participation Level:  Active  Description of Group  Discharge planning group reviews patient's anticipated discharge plans and assists patients to anticipate and address any barriers to wellness/recovery in the community.  Suicide prevention education is reviewed with patients in group.   Therapeutic Goals 1. Patients will state their anticipated discharge plan and mental health aftercare 2. Patients will identify potential barriers to wellness in the community setting 3. Patients will engage in problem solving, solution focused discussion of ways to anticipate and address barriers to wellness/recovery   Summary of Patient Progress: Patient participated in group by writing down her goals and what she wants to do when she gets out of the hospital. Patient stated that she wants to get a job and become a positive influencer.     Therapeutic Modalities: Motivational Interviewing    Susa Simmonds, Theresia Majors 04/29/2020 2:49 PM

## 2020-04-29 NOTE — Progress Notes (Signed)
Patient has been in bed resting, however, she has been sleeping peacefully,for the past thirty minutes.

## 2020-04-30 DIAGNOSIS — F23 Brief psychotic disorder: Secondary | ICD-10-CM | POA: Diagnosis not present

## 2020-04-30 MED ORDER — RISPERIDONE 1 MG PO TBDP
4.0000 mg | ORAL_TABLET | Freq: Every day | ORAL | Status: DC
  Administered 2020-04-30 – 2020-05-01 (×2): 4 mg via ORAL
  Filled 2020-04-30 (×2): qty 4

## 2020-04-30 NOTE — Progress Notes (Signed)
Patient was outside in the courtyard, conversing with peers, having a very good time this afternoon. She stayed outside until it was time to come in for dinner.

## 2020-04-30 NOTE — Progress Notes (Signed)
Patient is alert and cooperative. She is oriented and pleasant at this encounter. She denies SI  HI  AVH anxiety and pain. She does endorse depression and reports its due to her readiness to go home. She is medication compliance and tolerated her medications without incident.  She remains safe on the unit with 25 minute safety checks and was encouraged to come to staff with any concerns.      Cleo Butler-Nicholson, LPN

## 2020-04-30 NOTE — Progress Notes (Signed)
Recreation Therapy Notes   Date: 04/30/2020  Time: 9:30 am   Location: Craft room     Behavioral response: N/A   Intervention Topic: Relaxation   Discussion/Intervention: Patient did not attend group.   Clinical Observations/Feedback:  Patient did not attend group.   Mariyanna Mucha LRT/CTRS        Jannet Calip 04/30/2020 2:03 PM 

## 2020-04-30 NOTE — Plan of Care (Signed)
D- Patient alert and oriented. Patient presents in a sad, but pleasant mood on assessment stating that she slept good last night and had no complaints to voice to this Clinical research associate. Patient endorsed both depression and anxiety, stating "I'm just ready to go home".  Patient denies SI, HI, AVH, and pain at this time. Patient's goal for today is "to be positive", in which she will "not be down", in order to achieve her goal.  A- Scheduled medications administered to patient, per MD orders. Support and encouragement provided.  Routine safety checks conducted every 15 minutes.  Patient informed to notify staff with problems or concerns.  R- No adverse drug reactions noted. Patient contracts for safety at this time. Patient compliant with medications and treatment plan. Patient receptive, calm, and cooperative. Patient interacts well with others on the unit.  Patient remains safe at this time.  Problem: Consults Goal: Concurrent Medical Patient Education Description: (See Patient Education Module for education specifics) Outcome: Progressing   Problem: [facility] Concurrent Medical Problem Goal: LTG-Pt will be physically stable and he/significant other Description: (Patient will be physically stable and he/significant other will be able to verbalize understanding of follow-up care and symptoms that would warrant further treatment) Outcome: Progressing Goal: STG-Vital signs will be within defined limits or stabilized Description: (STG- Vital signs will be within defined limits or stabilized for individual) Outcome: Progressing Goal: STG-Compliance with medication and/or treatment as ordered Description: (STG-Compliance with medication and/or treatment as ordered by MD) Outcome: Progressing Goal: STG-Verbalize two symptoms that would warrant further Description: (STG-Verbalize two symptoms that would warrant further treatment) Outcome: Progressing Goal: STG-Patient will participate in  management/stabilization Description: (STG-Patient will participate in management/stabilization of medical condition) Outcome: Progressing Goal: STG-Other (Specify): Description: STG-Other Concurrent Medical (Specify): Outcome: Progressing   Problem: Education: Goal: Utilization of techniques to improve thought processes will improve Outcome: Progressing Goal: Knowledge of the prescribed therapeutic regimen will improve Outcome: Progressing   Problem: Activity: Goal: Interest or engagement in leisure activities will improve Outcome: Progressing Goal: Imbalance in normal sleep/wake cycle will improve Outcome: Progressing   Problem: Coping: Goal: Coping ability will improve Outcome: Progressing Goal: Will verbalize feelings Outcome: Progressing   Problem: Health Behavior/Discharge Planning: Goal: Ability to make decisions will improve Outcome: Progressing Goal: Compliance with therapeutic regimen will improve Outcome: Progressing   Problem: Role Relationship: Goal: Will demonstrate positive changes in social behaviors and relationships Outcome: Progressing   Problem: Safety: Goal: Ability to disclose and discuss suicidal ideas will improve Outcome: Progressing Goal: Ability to identify and utilize support systems that promote safety will improve Outcome: Progressing   Problem: Self-Concept: Goal: Will verbalize positive feelings about self Outcome: Progressing Goal: Level of anxiety will decrease Outcome: Progressing   Problem: Education: Goal: Knowledge of [facility] Education information/materials will improve Outcome: Progressing Goal: Emotional status will improve Outcome: Progressing Goal: Mental status will improve Outcome: Progressing Goal: Verbalization of understanding the information provided will improve Outcome: Progressing

## 2020-04-30 NOTE — Progress Notes (Signed)
Patient has been isolative to her room this evening laying in her bed. She had to be prompted to get snack and to take her medications.  She is pleasant and cooperative and presents with no behavioral issues.  She denies SI  HI  AVH depression anxiety and pain at this encounter. She report being "just tired" as her reason for her isolation. She is med compliant and tolerated her medication without incident. She remains safe on the unit with 15 minute safety checks and encouraged to contact staff with any concerns.    Cleo Butler-Nicholson, LPN

## 2020-04-30 NOTE — Plan of Care (Signed)
  Problem: Consults Goal: Concurrent Medical Patient Education Description: (See Patient Education Module for education specifics) Outcome: Progressing   Problem: [facility] Concurrent Medical Problem Goal: LTG-Pt will be physically stable and he/significant other Description: (Patient will be physically stable and he/significant other will be able to verbalize understanding of follow-up care and symptoms that would warrant further treatment) Outcome: Progressing Goal: STG-Vital signs will be within defined limits or stabilized Description: (STG- Vital signs will be within defined limits or stabilized for individual) Outcome: Progressing Goal: STG-Compliance with medication and/or treatment as ordered Description: (STG-Compliance with medication and/or treatment as ordered by MD) Outcome: Progressing Goal: STG-Verbalize two symptoms that would warrant further Description: (STG-Verbalize two symptoms that would warrant further treatment) Outcome: Progressing Goal: STG-Patient will participate in management/stabilization Description: (STG-Patient will participate in management/stabilization of medical condition) Outcome: Progressing Goal: STG-Other (Specify): Description: STG-Other Concurrent Medical (Specify): Outcome: Progressing   Problem: Education: Goal: Utilization of techniques to improve thought processes will improve Outcome: Progressing Goal: Knowledge of the prescribed therapeutic regimen will improve Outcome: Progressing   Problem: Activity: Goal: Interest or engagement in leisure activities will improve Outcome: Progressing Goal: Imbalance in normal sleep/wake cycle will improve Outcome: Progressing   Problem: Coping: Goal: Coping ability will improve Outcome: Progressing Goal: Will verbalize feelings Outcome: Progressing   Problem: Health Behavior/Discharge Planning: Goal: Ability to make decisions will improve Outcome: Progressing Goal: Compliance with therapeutic  regimen will improve Outcome: Progressing   Problem: Role Relationship: Goal: Will demonstrate positive changes in social behaviors and relationships Outcome: Progressing   Problem: Safety: Goal: Ability to disclose and discuss suicidal ideas will improve Outcome: Progressing Goal: Ability to identify and utilize support systems that promote safety will improve Outcome: Progressing   Problem: Self-Concept: Goal: Will verbalize positive feelings about self Outcome: Progressing Goal: Level of anxiety will decrease Outcome: Progressing   Problem: Education: Goal: Knowledge of [facility] Education information/materials will improve Outcome: Progressing Goal: Emotional status will improve Outcome: Progressing Goal: Mental status will improve Outcome: Progressing Goal: Verbalization of understanding the information provided will improve Outcome: Progressing   

## 2020-04-30 NOTE — Progress Notes (Signed)
[facility]  04/30/2020 3:18 PM Robin Hess  MRN:  [id number]   Subjective:   "I'm a little bit anxious"   19 year old woman brought into the hospital under IVC Patient was brought in by EMS after running away from family running out through the woods with bizarre talking behavior. Reported by EMS that the patient was muttering to herself about being guilty of something or of having killed someone.  Patient is seen for follow-up today.  Chart reviewed discussed with nursing staff. Yesterday patient was attempting to elope from unit, and again feeling like she had killed her entire family. She required PRN Haldol/Benadryl/Ativan yesterday afternoon. Overnight and this morning patient has been medication compliant, and eating meals.   She is seen one-on-one in office today. She continues to have constricted affect, poor eye contact, and remains internally preoccupied. Today she states she is feeling a bit anxious because she is having difficulty telling what is real, and what is not. She continues to have auditory hallucinations telling her that her family has died. Today, she able to recognize that the voices are lying because she has spoken to her family on the phone. She denies visual hallucinations,  suicidal thoughts, or homicidal thoughts   Principal Problem: Psychosis (HCC) Diagnosis: Principal Problem:   Psychosis (HCC) Active Problems:   Social anxiety disorder   OCD (obsessive compulsive disorder)  Total Time spent with patient: 25 min  Past Psychiatric History: No new information , patient has had 2 prior hospitalizations in 2015 and 2019. Last diagnosis was depression and OCD. Did not follow-up with outpatient treatment. Not currently getting any outpatient treatment. Has made suicidal statements in the past. Recently had not been trying to hurt her self or showing any violence to others. Some history of cannabis use in the past but currently does not seem to be an  active issue.  Past Medical History:  Past Medical History:  Diagnosis Date  . Allergy   . Anxiety   . Cannabis use disorder, moderate, dependence (HCC)   . Depression   . OCD (obsessive compulsive disorder)   . Severe major depression, single episode, with psychotic features (HCC)   . Social anxiety disorder    History reviewed. No pertinent surgical history. Family History: History reviewed. No pertinent family history. Family Psychiatric  History: None reported Social History:  Social History   Substance and Sexual Activity  Alcohol Use No     Social History   Substance and Sexual Activity  Drug Use Yes  . Types: Marijuana    Social History   Socioeconomic History  . Marital status: Single    Spouse name: Not on file  . Number of children: Not on file  . Years of education: Not on file  . Highest education level: Not on file  Occupational History  . Not on file  Tobacco Use  . Smoking status: Never Smoker  . Smokeless tobacco: Never Used  Substance and Sexual Activity  . Alcohol use: No  . Drug use: Yes    Types: Marijuana  . Sexual activity: Never    Birth control/protection: None  Other Topics Concern  . Not on file  Social History Narrative  . Not on file   Social Determinants of Health   Financial Resource Strain:   . Difficulty of Paying Living Expenses: Not on file  Food Insecurity:   . Worried About Programme researcher, broadcasting/film/videounning Out of Food in the Last Year: Not on file  . Ran Out of  Food in the Last Year: Not on file  Transportation Needs:   . Lack of Transportation (Medical): Not on file  . Lack of Transportation (Non-Medical): Not on file  Physical Activity:   . Days of Exercise per Week: Not on file  . Minutes of Exercise per Session: Not on file  Stress:   . Feeling of Stress : Not on file  Social Connections:   . Frequency of Communication with Friends and Family: Not on file  . Frequency of Social Gatherings with Friends and Family: Not on file  .  Attends Religious Services: Not on file  . Active Member of Clubs or Organizations: Not on file  . Attends Banker Meetings: Not on file  . Marital Status: Not on file   Additional Social History:                         Sleep: Fair  Appetite:  Fair  Current Medications: Current Facility-Administered Medications  Medication Dose Route Frequency Provider Last Rate Last Admin  . acetaminophen (TYLENOL) tablet 650 mg  650 mg Oral Q6H PRN Clapacs, [name], MD   650 mg at 04/24/20 0818  . alum & mag hydroxide-simeth (MAALOX/MYLANTA) 200-200-20 MG/5ML suspension 30 mL  30 mL Oral Q4H PRN Clapacs, [name], MD      . chlorproMAZINE (THORAZINE) injection 25 mg  25 mg Intramuscular TID PRN [name], MD      . haloperidol (HALDOL) tablet 5 mg  5 mg Oral Q8H PRN [name], MD   5 mg at 04/29/20 1529   And  . LORazepam (ATIVAN) tablet 2 mg  2 mg Oral Q8H PRN [name], MD   2 mg at 04/27/20 1627   And  . diphenhydrAMINE (BENADRYL) capsule 50 mg  50 mg Oral Q8H PRN [name], MD   50 mg at 04/29/20 1529  . feeding supplement (ENSURE ENLIVE / ENSURE PLUS) liquid 237 mL  237 mL Oral BID BM [name], MD   237 mL at 04/30/20 1020  . fluvoxaMINE (LUVOX) tablet 50 mg  50 mg Oral BID [name], MD   50 mg at 04/30/20 0751  . hydrOXYzine (ATARAX/VISTARIL) tablet 50 mg  50 mg Oral TID PRN Clapacs, [name], MD   50 mg at 04/29/20 1529  . LORazepam (ATIVAN) tablet 2 mg  2 mg Oral Q6H PRN [name], MD   2 mg at 04/29/20 1058   Or  . LORazepam (ATIVAN) injection 2 mg  2 mg Intramuscular Q6H PRN [name], MD   2 mg at 04/26/20 1645  . magnesium hydroxide (MILK OF MAGNESIA) suspension 30 mL  30 mL Oral Daily PRN Clapacs, [name], MD      . risperiDONE (RISPERDAL M-TABS) disintegrating tablet 4 mg  4 mg Oral QHS [name], MD      . sodium chloride (OCEAN) 0.65 % nasal spray 2 spray  2 spray Each Nare Q4H PRN [name], MD       . traZODone (DESYREL) tablet 100 mg  100 mg Oral QHS PRN Clapacs, [name], MD   100 mg at 04/29/20 2135    Lab Results:  No results found for this or any previous visit (from the past 48 hour(s)).  Blood Alcohol level:  Lab Results  Component Value Date   ETH <10 04/12/2020   ETH <10 03/03/2018  Metabolic Disorder Labs: Lab Results  Component Value Date   HGBA1C 4.7 (L) 04/13/2020   MPG 88.19 04/13/2020   MPG 93.93 03/03/2018   Lab Results  Component Value Date   PROLACTIN 17.5 05/02/2014   Lab Results  Component Value Date   CHOL 122 04/13/2020   TRIG 50 04/13/2020   HDL 48 04/13/2020   CHOLHDL 2.5 04/13/2020   VLDL 10 04/13/2020   LDLCALC 64 04/13/2020   LDLCALC 90 03/03/2018    Physical Findings: AIMS: Facial and Oral Movements Muscles of Facial Expression: None, normal Lips and Perioral Area: None, normal Jaw: None, normal Tongue: None, normal,Extremity Movements Upper (arms, wrists, hands, fingers): None, normal Lower (legs, knees, ankles, toes): None, normal, Trunk Movements Neck, shoulders, hips: None, normal, Overall Severity Severity of abnormal movements (highest score from questions above): None, normal Incapacitation due to abnormal movements: None, normal Patient's awareness of abnormal movements (rate only patient's report): No Awareness, Dental Status Current problems with teeth and/or dentures?: No  CIWA:  CIWA-Ar Total: 0 COWS:  COWS Total Score: 2  Musculoskeletal: Strength & Muscle Tone: within normal limits Gait & Station: normal Patient leans: N/A  Psychiatric Specialty Exam: Physical Exam Vitals and nursing note reviewed.  Constitutional:      General: She is not in acute distress.    Appearance: Normal appearance.  HENT:     Head: Normocephalic and atraumatic.     Right Ear: External ear normal.     Left Ear: External ear normal.     Nose: Nose normal.     Mouth/Throat:     Mouth: Mucous membranes are moist.      Pharynx: Oropharynx is clear.  Eyes:     Extraocular Movements: Extraocular movements intact.     Conjunctiva/sclera: Conjunctivae normal.  Cardiovascular:     Rate and Rhythm: Normal rate.     Pulses: Normal pulses.  Pulmonary:     Effort: Pulmonary effort is normal.     Breath sounds: Normal breath sounds.  Abdominal:     General: Abdomen is flat.  Musculoskeletal:        General: No swelling. Normal range of motion.     Cervical back: Normal range of motion.  Neurological:     General: No focal deficit present.     Mental Status: She is alert.  Psychiatric:        Attention and Perception: She is attentive. She perceives auditory hallucinations.        Mood and Affect: Mood is depressed. Affect is flat.        Speech: Speech normal. Speech is not delayed.        Behavior: Behavior is cooperative.        Thought Content: Thought content is paranoid and delusional. Thought content does not include suicidal ideation.        Cognition and Memory: Cognition is not impaired. Memory is not impaired.        Judgment: Judgment is impulsive.     Review of Systems  Constitutional: Positive for fatigue. Negative for appetite change.  HENT: Negative for rhinorrhea and sore throat.   Eyes: Negative for photophobia and visual disturbance.  Respiratory: Negative for cough and shortness of breath.   Cardiovascular: Negative for chest pain and palpitations.  Gastrointestinal: Negative for constipation, diarrhea, nausea and vomiting.  Endocrine: Negative for cold intolerance and heat intolerance.  Genitourinary: Negative for difficulty urinating and dysuria.  Musculoskeletal: Negative for arthralgias and gait problem.  Skin: Negative for  rash and wound.  Allergic/Immunologic: Negative for food allergies and immunocompromised state.  Neurological: Negative for dizziness and headaches.  Hematological: Negative for adenopathy. Does not bruise/bleed easily.  Psychiatric/Behavioral: Positive for  agitation, dysphoric mood, hallucinations and suicidal ideas. The patient is nervous/anxious.     Blood pressure 98/69, pulse 83, temperature 98 F (36.7 C), temperature source Oral, resp. rate 17, height 5\' 11"  (1.803 m), weight 63.5 kg, last menstrual period 03/30/2020, SpO2 96 %.Body mass index is 19.53 kg/m.  General Appearance: Disheveled  Eye Contact:  Fair  Speech:  Slow  Volume:  Normal  Mood:  Depressed  Affect:  Blunted, anxious  Thought Process:  Disorganized  Orientation:  Full (Time, Place, and Person)  Thought Content:  Delusions, Hallucinations: Auditory, Paranoid Ideation and Rumination, AH at times  Suicidal Thoughts:  Yes.  without intent/plan  Homicidal Thoughts:  No  Memory:  Immediate;   Fair Recent;   Fair Remote;   Fair  Judgement:  Impaired  Insight:  Present  Psychomotor Activity:  Restlessness  Concentration:  Concentration: Poor and Attention Span: Poor  Recall:  13/09/2019 of Knowledge:  Poor  Language:  Fair  Akathisia:  Negative  Handed:  Right  AIMS (if indicated):     Assets:  Desire for Improvement Housing Social Support  ADL's:  Impaired  Cognition:  Impaired,  Mild  Sleep:  Number of Hours: 8     Treatment Plan Summary: Daily contact with patient to assess and evaluate symptoms and progress in treatment and Medication management PLAN OF CARE:19 year old woman with a historydepression and OCDnot currently in any treatment. Immediate behavior consistent with acute psychosis. Patient is living with her family,not working,not in school,low functioning. As patient's psychosis has began to clear her diagnosis seems most consistent with MDD, recurrent, severe with psychotic features. However,differential diagnosis continues to includenew onset schizophrenia, bipolar disorder with psychosis.  Plan-patient continues to be psychotic,  Increase Risperdal 4 mg for psychosis, QTC 459, Continue Lovenox 50 mg twice daily for depression anxiety  and OCD.  Seroquel not effective at 450 mg total dose, and have titrated off and discontinued. Continue  Haldol/Ativan/Bendaryl PRN for acute agitation.  Group and milieu treatment.  36, MD 04/30/2020, 3:18 PMPatient ID: [id number], female   DOB: 2000/06/01, 19 y.o.   MRN: [id number] Patient ID: Dhruvi Crenshaw, female   DOB: 2001/04/01, 19 y.o.   MRN: [id number]

## 2020-04-30 NOTE — [facility] Group Notes (Signed)
LCSW Group Therapy Note   04/30/2020 2:28 PM  Type of Therapy and Topic:  Group Therapy:  Overcoming Obstacles   Participation Level:  None   Description of Group:    In this group patients will be encouraged to explore what they see as obstacles to their own wellness and recovery. They will be guided to discuss their thoughts, feelings, and behaviors related to these obstacles. The group will process together ways to cope with barriers, with attention given to specific choices patients can make. Each patient will be challenged to identify changes they are motivated to make in order to overcome their obstacles. This group will be process-oriented, with patients participating in exploration of their own experiences as well as giving and receiving support and challenge from other group members.   Therapeutic Goals: 1. Patient will identify personal and current obstacles as they relate to admission. 2. Patient will identify barriers that currently interfere with their wellness or overcoming obstacles.  3. Patient will identify feelings, thought process and behaviors related to these barriers. 4. Patient will identify two changes they are willing to make to overcome these obstacles:      Summary of Patient Progress Pt was physically present in the group but did not participate in the discussion on any level.      Therapeutic Modalities:   Cognitive Behavioral Therapy Solution Focused Therapy Motivational Interviewing Relapse Prevention Therapy  Simona Huh R. Algis Greenhouse, MSW, LCSW, LCAS 04/30/2020 2:28 PM

## 2020-04-30 NOTE — Plan of Care (Signed)
  Problem: Consults Goal: Concurrent Medical Patient Education Description: (See Patient Education Module for education specifics) Outcome: Progressing   Problem: [facility] Concurrent Medical Problem Goal: LTG-Pt will be physically stable and he/significant other Description: (Patient will be physically stable and he/significant other will be able to verbalize understanding of follow-up care and symptoms that would warrant further treatment) Outcome: Progressing Goal: STG-Vital signs will be within defined limits or stabilized Description: (STG- Vital signs will be within defined limits or stabilized for individual) Outcome: Progressing Goal: STG-Compliance with medication and/or treatment as ordered Description: (STG-Compliance with medication and/or treatment as ordered by MD) Outcome: Progressing Goal: STG-Verbalize two symptoms that would warrant further Description: (STG-Verbalize two symptoms that would warrant further treatment) Outcome: Progressing Goal: STG-Patient will participate in management/stabilization Description: (STG-Patient will participate in management/stabilization of medical condition) Outcome: Progressing Goal: STG-Other (Specify): Description: STG-Other Concurrent Medical (Specify): Outcome: Progressing   Problem: Education: Goal: Utilization of techniques to improve thought processes will improve Outcome: Progressing Goal: Knowledge of the prescribed therapeutic regimen will improve Outcome: Progressing   Problem: Activity: Goal: Interest or engagement in leisure activities will improve Outcome: Progressing Goal: Imbalance in normal sleep/wake cycle will improve Outcome: Progressing   Problem: Coping: Goal: Coping ability will improve Outcome: Progressing Goal: Will verbalize feelings Outcome: Progressing   Problem: Health Behavior/Discharge Planning: Goal: Ability to make decisions will improve Outcome: Progressing Goal: Compliance with therapeutic  regimen will improve Outcome: Progressing   Problem: Role Relationship: Goal: Will demonstrate positive changes in social behaviors and relationships Outcome: Progressing   Problem: Safety: Goal: Ability to disclose and discuss suicidal ideas will improve Outcome: Progressing Goal: Ability to identify and utilize support systems that promote safety will improve Outcome: Progressing   Problem: Self-Concept: Goal: Will verbalize positive feelings about self Outcome: Progressing Goal: Level of anxiety will decrease Outcome: Progressing   Problem: Education: Goal: Knowledge of [facility] Education information/materials will improve Outcome: Progressing Goal: Emotional status will improve Outcome: Progressing Goal: Mental status will improve Outcome: Progressing Goal: Verbalization of understanding the information provided will improve Outcome: Progressing   

## 2020-05-01 DIAGNOSIS — F23 Brief psychotic disorder: Secondary | ICD-10-CM | POA: Diagnosis not present

## 2020-05-01 NOTE — Progress Notes (Signed)
D: Pt alert and oriented. Pt endorses experiencing anxiety/depression however does not rate them or given further details as to origin. Pt reports experiencing headach pain at this time, prn meds given. Pt denies experiencing any HI, or AVH at this time, however states she does sometimes when she's in her room but doesn't know it what she's hearing/seeing is real or not. Pt directed to come and get staff and we can tell her if what she's hearing/seeing is actually real or not. Pt also endorses SI w/no plan or intent to act. Pt contracts for safety while here.  A: Scheduled medications administered to pt, per MD orders. Support and encouragement provided. Frequent verbal contact made. Routine safety checks conducted q15 minutes.   R: No adverse drug reactions noted. Pt verbally contracts for safety at this time. Pt complaint with medications. Pt interacts well with others on the unit minimally and can be seen from time to time in the milue. Pt remains safe at this time. Will continue to monitor.

## 2020-05-01 NOTE — Progress Notes (Signed)
This evening a lawyer called from the medical mall entrance to speak to the patient however due to proper legal paperwork not being served to the pt the lawyer was unable to come and speak with this patient. Myself and the unit secretary both checked the pt's record for the legal documentation that was suppose to be served and it was not present.

## 2020-05-01 NOTE — Progress Notes (Signed)
Recreation Therapy Notes  Date: 05/01/2020  Time: 9:30 am   Location: Craft room     Behavioral response: N/A   Intervention Topic: Problem Solving    Discussion/Intervention: Patient did not attend group.   Clinical Observations/Feedback:  Patient did not attend group.   Jaece Ducharme LRT/CTRS        Robin Hess 05/01/2020 10:53 AM 

## 2020-05-01 NOTE — Progress Notes (Signed)
[facility] MD Progress Note  05/01/2020 1:48 PM Earlean Fidalgo  MRN:  [id number]   Subjective:   "I'm a little bit anxious"   19 year old woman brought into the hospital under IVC Patient was brought in by EMS after running away from family running out through the woods with bizarre talking behavior. Reported by EMS that the patient was muttering to herself about being guilty of something or of having killed someone.  Patient is seen for follow-up today.  Chart reviewed discussed with nursing staff. No PRNs needed yesterday. PRN hydroxyzine given this morning for anxiety. Patient has been medication compliant, and eating meals.   She is seen one-on-one at bedside today today. She continues to have constricted affect, poor eye contact, and remains internally preoccupied. She continues to endorse auditory hallucinations of voices telling her that her family is dead. She continues to have difficulty telling what is real, and what is in her mind. She has had passive suicidal thoughts today stating that she wished she would die because she has hurt so many people. She denies active plan or intent. Today she reports headache from trying to ignore the voices.  She denies visual hallucinations, or homicidal thoughts   Principal Problem: Psychosis (HCC) Diagnosis: Principal Problem:   Psychosis (HCC) Active Problems:   Social anxiety disorder   OCD (obsessive compulsive disorder)  Total Time spent with patient: 25 min  Past Psychiatric History: No new information , patient has had 2 prior hospitalizations in 2015 and 2019. Last diagnosis was depression and OCD. Did not follow-up with outpatient treatment. Not currently getting any outpatient treatment. Has made suicidal statements in the past. Recently had not been trying to hurt her self or showing any violence to others. Some history of cannabis use in the past but currently does not seem to be an active issue.  Past Medical History:  Past Medical  History:  Diagnosis Date  . Allergy   . Anxiety   . Cannabis use disorder, moderate, dependence (HCC)   . Depression   . OCD (obsessive compulsive disorder)   . Severe major depression, single episode, with psychotic features (HCC)   . Social anxiety disorder    History reviewed. No pertinent surgical history. Family History: History reviewed. No pertinent family history. Family Psychiatric  History: None reported Social History:  Social History   Substance and Sexual Activity  Alcohol Use No     Social History   Substance and Sexual Activity  Drug Use Yes  . Types: Marijuana    Social History   Socioeconomic History  . Marital status: Single    Spouse name: Not on file  . Number of children: Not on file  . Years of education: Not on file  . Highest education level: Not on file  Occupational History  . Not on file  Tobacco Use  . Smoking status: Never Smoker  . Smokeless tobacco: Never Used  Substance and Sexual Activity  . Alcohol use: No  . Drug use: Yes    Types: Marijuana  . Sexual activity: Never    Birth control/protection: None  Other Topics Concern  . Not on file  Social History Narrative  . Not on file   Social Determinants of Health   Financial Resource Strain:   . Difficulty of Paying Living Expenses: Not on file  Food Insecurity:   . Worried About Programme researcher, broadcasting/film/video in the Last Year: Not on file  . Ran Out of Food in the Last Year: Not  on file  Transportation Needs:   . Lack of Transportation (Medical): Not on file  . Lack of Transportation (Non-Medical): Not on file  Physical Activity:   . Days of Exercise per Week: Not on file  . Minutes of Exercise per Session: Not on file  Stress:   . Feeling of Stress : Not on file  Social Connections:   . Frequency of Communication with Friends and Family: Not on file  . Frequency of Social Gatherings with Friends and Family: Not on file  . Attends Religious Services: Not on file  . Active Member  of Clubs or Organizations: Not on file  . Attends Banker Meetings: Not on file  . Marital Status: Not on file   Additional Social History:                         Sleep: Fair  Appetite:  Fair  Current Medications: Current Facility-Administered Medications  Medication Dose Route Frequency Provider Last Rate Last Admin  . acetaminophen (TYLENOL) tablet 650 mg  650 mg Oral Q6H PRN Clapacs, [name], MD   650 mg at 05/01/20 0802  . alum & mag hydroxide-simeth (MAALOX/MYLANTA) 200-200-20 MG/5ML suspension 30 mL  30 mL Oral Q4H PRN Clapacs, [name], MD      . chlorproMAZINE (THORAZINE) injection 25 mg  25 mg Intramuscular TID PRN [name], MD      . haloperidol (HALDOL) tablet 5 mg  5 mg Oral Q8H PRN [name], MD   5 mg at 04/29/20 1529   And  . LORazepam (ATIVAN) tablet 2 mg  2 mg Oral Q8H PRN [name], MD   2 mg at 04/27/20 1627   And  . diphenhydrAMINE (BENADRYL) capsule 50 mg  50 mg Oral Q8H PRN [name], MD   50 mg at 04/29/20 1529  . feeding supplement (ENSURE ENLIVE / ENSURE PLUS) liquid 237 mL  237 mL Oral BID BM [name], MD   237 mL at 05/01/20 1009  . fluvoxaMINE (LUVOX) tablet 50 mg  50 mg Oral BID [name], MD   50 mg at 05/01/20 0802  . hydrOXYzine (ATARAX/VISTARIL) tablet 50 mg  50 mg Oral TID PRN Clapacs, [name], MD   50 mg at 05/01/20 0802  . LORazepam (ATIVAN) tablet 2 mg  2 mg Oral Q6H PRN [name], MD   2 mg at 04/29/20 1058   Or  . LORazepam (ATIVAN) injection 2 mg  2 mg Intramuscular Q6H PRN [name], MD   2 mg at 04/26/20 1645  . magnesium hydroxide (MILK OF MAGNESIA) suspension 30 mL  30 mL Oral Daily PRN Clapacs, [name], MD      . risperiDONE (RISPERDAL M-TABS) disintegrating tablet 4 mg  4 mg Oral QHS [name], MD   4 mg at 04/30/20 2117  . sodium chloride (OCEAN) 0.65 % nasal spray 2 spray  2 spray Each Nare Q4H PRN [name], MD      . traZODone (DESYREL) tablet 100  mg  100 mg Oral QHS PRN Clapacs, [name], MD   100 mg at 04/30/20 2117    Lab Results:  No results found for this or any previous visit (from the past 48 hour(s)).  Blood Alcohol level:  Lab Results  Component Value Date   ETH <10 04/12/2020   ETH <10 03/03/2018    Metabolic Disorder Labs:  Lab Results  Component Value Date   HGBA1C 4.7 (L) 04/13/2020   MPG 88.19 04/13/2020   MPG 93.93 03/03/2018   Lab Results  Component Value Date   PROLACTIN 17.5 05/02/2014   Lab Results  Component Value Date   CHOL 122 04/13/2020   TRIG 50 04/13/2020   HDL 48 04/13/2020   CHOLHDL 2.5 04/13/2020   VLDL 10 04/13/2020   LDLCALC 64 04/13/2020   LDLCALC 90 03/03/2018    Physical Findings: AIMS: Facial and Oral Movements Muscles of Facial Expression: None, normal Lips and Perioral Area: None, normal Jaw: None, normal Tongue: None, normal,Extremity Movements Upper (arms, wrists, hands, fingers): None, normal Lower (legs, knees, ankles, toes): None, normal, Trunk Movements Neck, shoulders, hips: None, normal, Overall Severity Severity of abnormal movements (highest score from questions above): None, normal Incapacitation due to abnormal movements: None, normal Patient's awareness of abnormal movements (rate only patient's report): No Awareness, Dental Status Current problems with teeth and/or dentures?: No  CIWA:  CIWA-Ar Total: 0 COWS:  COWS Total Score: 2  Musculoskeletal: Strength & Muscle Tone: within normal limits Gait & Station: normal Patient leans: N/A  Psychiatric Specialty Exam: Physical Exam Vitals and nursing note reviewed.  Constitutional:      General: She is not in acute distress.    Appearance: Normal appearance.  HENT:     Head: Normocephalic and atraumatic.     Right Ear: External ear normal.     Left Ear: External ear normal.     Nose: Nose normal.     Mouth/Throat:     Mouth: Mucous membranes are moist.     Pharynx: Oropharynx is clear.  Eyes:      Extraocular Movements: Extraocular movements intact.     Conjunctiva/sclera: Conjunctivae normal.  Cardiovascular:     Rate and Rhythm: Normal rate.     Pulses: Normal pulses.  Pulmonary:     Effort: Pulmonary effort is normal.     Breath sounds: Normal breath sounds.  Abdominal:     General: Abdomen is flat.  Musculoskeletal:        General: No swelling. Normal range of motion.     Cervical back: Normal range of motion.  Neurological:     General: No focal deficit present.     Mental Status: She is alert.  Psychiatric:        Attention and Perception: She is attentive. She perceives auditory hallucinations.        Mood and Affect: Mood is depressed. Affect is flat.        Speech: Speech normal. Speech is not delayed.        Behavior: Behavior is cooperative.        Thought Content: Thought content is paranoid and delusional. Thought content includes suicidal ideation. Thought content does not include suicidal plan.        Cognition and Memory: Cognition is not impaired. Memory is not impaired.        Judgment: Judgment is impulsive.     Review of Systems  Constitutional: Positive for fatigue. Negative for appetite change.  HENT: Negative for rhinorrhea and sore throat.   Eyes: Negative for photophobia and visual disturbance.  Respiratory: Negative for cough and shortness of breath.   Cardiovascular: Negative for chest pain and palpitations.  Gastrointestinal: Negative for constipation, diarrhea, nausea and vomiting.  Endocrine: Negative for cold intolerance and heat intolerance.  Genitourinary: Negative for difficulty urinating and dysuria.  Musculoskeletal: Negative for arthralgias and gait problem.  Skin:  Negative for rash and wound.  Allergic/Immunologic: Negative for food allergies and immunocompromised state.  Neurological: Negative for dizziness and headaches.  Hematological: Negative for adenopathy. Does not bruise/bleed easily.  Psychiatric/Behavioral: Positive for  agitation, dysphoric mood, hallucinations and suicidal ideas. The patient is nervous/anxious.     Blood pressure 100/67, pulse (!) 112, temperature 97.9 F (36.6 C), temperature source Oral, resp. rate 17, height 5\' 11"  (1.803 m), weight 63.5 kg, last menstrual period 03/30/2020, SpO2 98 %.Body mass index is 19.53 kg/m.  General Appearance: Disheveled  Eye Contact:  Fair  Speech:  Slow  Volume:  Normal  Mood:  Depressed  Affect:  Blunted, anxious  Thought Process:  Disorganized  Orientation:  Full (Time, Place, and Person)  Thought Content:  Delusions, Hallucinations: Auditory, Paranoid Ideation and Rumination, AH at times  Suicidal Thoughts:  Yes.  without intent/plan  Homicidal Thoughts:  No  Memory:  Immediate;   Fair Recent;   Fair Remote;   Fair  Judgement:  Impaired  Insight:  Present  Psychomotor Activity:  Restlessness  Concentration:  Concentration: Poor and Attention Span: Poor  Recall:  [employer]Fair  Fund of Knowledge:  Poor  Language:  Fair  Akathisia:  Negative  Handed:  Right  AIMS (if indicated):     Assets:  Desire for Improvement Housing Social Support  ADL's:  Impaired  Cognition:  Impaired,  Mild  Sleep:  Number of Hours: 8.15     Treatment Plan Summary: Daily contact with patient to assess and evaluate symptoms and progress in treatment and Medication management PLAN OF CARE:19 year old woman with a historydepression and OCDnot currently in any treatment. Immediate behavior consistent with acute psychosis. Patient is living with her family,not working,not in school,low functioning. As patient's psychosis has began to clear her diagnosis seems most consistent with MDD, recurrent, severe with psychotic features. However,differential diagnosis continues to includenew onset schizophrenia, bipolar disorder with psychosis.  Plan-patient continues to be psychotic,  Continue Risperdal 4 mg for psychosis, QTC 459, Continue Lovenox 50 mg twice daily for  depression anxiety and OCD.  Seroquel not effective at 450 mg total dose, and have titrated off and discontinued. Continue  Haldol/Ativan/Bendaryl PRN for acute agitation.  Group and milieu treatment.  Jesse SansMegan M Khalaya Mcgurn, MD 05/01/2020, 1:48 PMPatient ID: Baldo AshAlicia Uy, female   DOB: [birth year], 19 y.o.   MRN: [id number] Patient ID: Baldo Ashlicia Faron, female   DOB: [birth year], 19 y.o.   MRN: [id number]

## 2020-05-02 DIAGNOSIS — F23 Brief psychotic disorder: Secondary | ICD-10-CM | POA: Diagnosis not present

## 2020-05-02 MED ORDER — HALOPERIDOL 1 MG PO TABS
2.0000 mg | ORAL_TABLET | Freq: Every day | ORAL | Status: DC
  Administered 2020-05-02: 2 mg via ORAL
  Filled 2020-05-02: qty 2

## 2020-05-02 MED ORDER — RISPERIDONE 1 MG PO TBDP
2.0000 mg | ORAL_TABLET | Freq: Every day | ORAL | Status: DC
  Administered 2020-05-02: 2 mg via ORAL
  Filled 2020-05-02: qty 2

## 2020-05-02 NOTE — Plan of Care (Signed)
  Problem: Group Participation Goal: STG - Patient will engage in groups without prompting or encouragement from LRT x3 group sessions within 5 recreation therapy group sessions Description: STG - Patient will engage in groups without prompting or encouragement from LRT x3 group sessions within 5 recreation therapy group sessions Outcome: Not Progressing   

## 2020-05-02 NOTE — Progress Notes (Signed)
D: Pt alert and oriented. Pt rates depression 10/10, hopelessness 10/10, and anxiety 10/10. Pt goal: " I don't know." Pt reports energy level as low and concentration as being poor. Pt reports sleep last night as being poor. Pt did not receive medications for sleep. Pt reports experiencing 10/10 headache pain, prn meds given. Pt denies experiencing any or AVH at this time. Pt endorses SI/HI. Pt has no plan while here and contracts for safety. Pt is HI "toward someone but doesn't know who." MD aware of SI/HI statement.   Pt BP low with high pulse, MD notified and is aware. When asked pt admits she has not been eating or drinking much. Pt given Gatorade and encouraged to drink more water. Pt has increase fluid intake slightly. Pt BP and pulse retaken and looks better.   A: Scheduled medications administered to pt, per MD orders. Support and encouragement provided. Frequent verbal contact made. Routine safety checks conducted q15 minutes.   R: No adverse drug reactions noted. Pt verbally contracts for safety at this time. Pt complaint with medications. Pt interacts minimally with others on the unit. Pt remains safe at this time. Will continue to monitor.

## 2020-05-02 NOTE — Progress Notes (Signed)
[facility] MD Progress Note  05/02/2020 11:31 AM Robin Hess  MRN:  [id number]   Subjective:   "I don't feel good"   19 year old woman brought into the hospital under IVC Patient was brought in by EMS after running away from family running out through the woods with bizarre talking behavior. Reported by EMS that the patient was muttering to herself about being guilty of something or of having killed someone.  Patient is seen for follow-up today.  Chart reviewed discussed with nursing staff. Yesterday, patient required PRN Ativan for anxiety. She was withdrawn to room majority of the day, and did not eat her meals.   She is seen one-on-one at bedside today today. She continues to have constricted affect, poor eye contact, and remains internally preoccupied. She continues to endorse auditory hallucinations. She continues to have difficulty telling what is real, and what is in her mind. She continues to have passive suicidal thoughts today stating that she wished she would die because she has hurt so many people. She denies active plan or intent. Today she reports headache from trying to ignore the voices.  She denies visual hallucinations, or homicidal thoughts.  Parents contacted at [phone]. They have been in daily contact with her on the phone, and also have not seen improvement with medication changes. They filed for guardianship two days after she was in the hospital. They worry that patient will be taken advantage of, or possibly convince her to leave the home. They are very concerned about her mental health, and want to remained involved in her care. They note she first started to experience increasingly levels of stress and paranoid delusions two months prior to admission. [facility]. Initially, she had been perseverating on "trolling" people on the internet, and feeling guilty about bullying others online. However, those delusions continued to morph  and worsen to point she felt she had started a human trafficking ring for the [place]. She became paranoid that people were trying to kill her, and ultimately ran away from home resulting in this admission. Ultimately, when patient is stabilized they want her to return home, and follow-up with outpatient provider. Reviewed her diagnosis, medication changes, and plan of care with parents.    Principal Problem: Psychosis (HCC) Diagnosis: Principal Problem:   Psychosis (HCC) Active Problems:   Social anxiety disorder   OCD (obsessive compulsive disorder)  Total Time spent with patient: 45 min  Past Psychiatric History: No new information , patient has had 2 prior hospitalizations in 2015 and 2019. Last diagnosis was depression and OCD. Did not follow-up with outpatient treatment. Not currently getting any outpatient treatment. Has made suicidal statements in the past. Recently had not been trying to hurt her self or showing any violence to others. Some history of cannabis use in the past but currently does not seem to be an active issue.  Past Medical History:  Past Medical History:  Diagnosis Date  . Allergy   . Anxiety   . Cannabis use disorder, moderate, dependence (HCC)   . Depression   . OCD (obsessive compulsive disorder)   . Severe major depression, single episode, with psychotic features (HCC)   . Social anxiety disorder    History reviewed. No pertinent surgical history. Family History: History reviewed. No pertinent family history. Family Psychiatric  History: None reported Social History:  Social History   Substance and Sexual Activity  Alcohol Use No     Social History  Substance and Sexual Activity  Drug Use Yes  . Types: Marijuana    Social History   Socioeconomic History  . Marital status: Single    Spouse name: Not on file  . Number of children: Not on file  . Years of education: Not on file  . Highest education level: Not on file   Occupational History  . Not on file  Tobacco Use  . Smoking status: Never Smoker  . Smokeless tobacco: Never Used  Substance and Sexual Activity  . Alcohol use: No  . Drug use: Yes    Types: Marijuana  . Sexual activity: Never    Birth control/protection: None  Other Topics Concern  . Not on file  Social History Narrative  . Not on file   Social Determinants of Health   Financial Resource Strain:   . Difficulty of Paying Living Expenses: Not on file  Food Insecurity:   . Worried About Programme researcher, broadcasting/film/video in the Last Year: Not on file  . Ran Out of Food in the Last Year: Not on file  Transportation Needs:   . Lack of Transportation (Medical): Not on file  . Lack of Transportation (Non-Medical): Not on file  Physical Activity:   . Days of Exercise per Week: Not on file  . Minutes of Exercise per Session: Not on file  Stress:   . Feeling of Stress : Not on file  Social Connections:   . Frequency of Communication with Friends and Family: Not on file  . Frequency of Social Gatherings with Friends and Family: Not on file  . Attends Religious Services: Not on file  . Active Member of Clubs or Organizations: Not on file  . Attends Banker Meetings: Not on file  . Marital Status: Not on file   Additional Social History:                         Sleep: Fair  Appetite:  Fair  Current Medications: Current Facility-Administered Medications  Medication Dose Route Frequency Provider Last Rate Last Admin  . acetaminophen (TYLENOL) tablet 650 mg  650 mg Oral Q6H PRN Clapacs, [name], MD   650 mg at 05/02/20 8250  . alum & mag hydroxide-simeth (MAALOX/MYLANTA) 200-200-20 MG/5ML suspension 30 mL  30 mL Oral Q4H PRN Clapacs, [name], MD      . chlorproMAZINE (THORAZINE) injection 25 mg  25 mg Intramuscular TID PRN [name], MD      . haloperidol (HALDOL) tablet 5 mg  5 mg Oral Q8H PRN [name], MD   5 mg at 04/29/20 1529   And  . LORazepam  (ATIVAN) tablet 2 mg  2 mg Oral Q8H PRN [name], MD   2 mg at 04/27/20 1627   And  . diphenhydrAMINE (BENADRYL) capsule 50 mg  50 mg Oral Q8H PRN [name], MD   50 mg at 04/29/20 1529  . feeding supplement (ENSURE ENLIVE / ENSURE PLUS) liquid 237 mL  237 mL Oral BID BM [name], MD   237 mL at 05/02/20 1049  . fluvoxaMINE (LUVOX) tablet 50 mg  50 mg Oral BID [name], MD   50 mg at 05/02/20 5397  . haloperidol (HALDOL) tablet 2 mg  2 mg Oral QHS [name], MD      . hydrOXYzine (ATARAX/VISTARIL) tablet 50 mg  50 mg Oral TID PRN Clapacs, [name], MD  50 mg at 05/01/20 0802  . LORazepam (ATIVAN) tablet 2 mg  2 mg Oral Q6H PRN Jesse SansFreeman, Alyn Riedinger M, MD   2 mg at 04/29/20 1058   Or  . LORazepam (ATIVAN) injection 2 mg  2 mg Intramuscular Q6H PRN Jesse SansFreeman, Denis Koppel M, MD   2 mg at 04/26/20 1645  . magnesium hydroxide (MILK OF MAGNESIA) suspension 30 mL  30 mL Oral Daily PRN Clapacs, [name], MD      . risperiDONE (RISPERDAL M-TABS) disintegrating tablet 2 mg  2 mg Oral QHS Jesse SansFreeman, Vylet Maffia M, MD      . sodium chloride (OCEAN) 0.65 % nasal spray 2 spray  2 spray Each Nare Q4H PRN Jesse SansFreeman, Donato Studley M, MD      . traZODone (DESYREL) tablet 100 mg  100 mg Oral QHS PRN Clapacs, Jackquline DenmarkJohn T, MD   100 mg at 04/30/20 2117    Lab Results:  No results found for this or any previous visit (from the past 48 hour(s)).  Blood Alcohol level:  Lab Results  Component Value Date   ETH <10 04/12/2020   ETH <10 03/03/2018    Metabolic Disorder Labs: Lab Results  Component Value Date   HGBA1C 4.7 (L) 04/13/2020   MPG 88.19 04/13/2020   MPG 93.93 03/03/2018   Lab Results  Component Value Date   PROLACTIN 17.5 05/02/2014   Lab Results  Component Value Date   CHOL 122 04/13/2020   TRIG 50 04/13/2020   HDL 48 04/13/2020   CHOLHDL 2.5 04/13/2020   VLDL 10 04/13/2020   LDLCALC 64 04/13/2020   LDLCALC 90 03/03/2018    Physical Findings: AIMS: Facial and Oral Movements Muscles of  Facial Expression: None, normal Lips and Perioral Area: None, normal Jaw: None, normal Tongue: None, normal,Extremity Movements Upper (arms, wrists, hands, fingers): None, normal Lower (legs, knees, ankles, toes): None, normal, Trunk Movements Neck, shoulders, hips: None, normal, Overall Severity Severity of abnormal movements (highest score from questions above): None, normal Incapacitation due to abnormal movements: None, normal Patient's awareness of abnormal movements (rate only patient's report): No Awareness, Dental Status Current problems with teeth and/or dentures?: No  CIWA:  CIWA-Ar Total: 0 COWS:  COWS Total Score: 2  Musculoskeletal: Strength & Muscle Tone: within normal limits Gait & Station: normal Patient leans: N/A  Psychiatric Specialty Exam: Physical Exam Vitals and nursing note reviewed.  Constitutional:      General: She is not in acute distress.    Appearance: Normal appearance.  HENT:     Head: Normocephalic and atraumatic.     Right Ear: External ear normal.     Left Ear: External ear normal.     Nose: Nose normal.     Mouth/Throat:     Mouth: Mucous membranes are moist.     Pharynx: Oropharynx is clear.  Eyes:     Extraocular Movements: Extraocular movements intact.     Conjunctiva/sclera: Conjunctivae normal.  Cardiovascular:     Rate and Rhythm: Normal rate.     Pulses: Normal pulses.  Pulmonary:     Effort: Pulmonary effort is normal.     Breath sounds: Normal breath sounds.  Abdominal:     General: Abdomen is flat.  Musculoskeletal:        General: No swelling. Normal range of motion.     Cervical back: Normal range of motion.  Neurological:     General: No focal deficit present.     Mental Status: She is alert.  Psychiatric:  Attention and Perception: She is attentive. She perceives auditory hallucinations.        Mood and Affect: Mood is depressed. Affect is flat.        Speech: Speech normal. Speech is not delayed.         Behavior: Behavior is cooperative.        Thought Content: Thought content is paranoid and delusional. Thought content includes suicidal ideation. Thought content does not include suicidal plan.        Cognition and Memory: Cognition is not impaired. Memory is not impaired.        Judgment: Judgment is impulsive.     Review of Systems  Constitutional: Positive for fatigue. Negative for appetite change.  HENT: Negative for rhinorrhea and sore throat.   Eyes: Negative for photophobia and visual disturbance.  Respiratory: Negative for cough and shortness of breath.   Cardiovascular: Negative for chest pain and palpitations.  Gastrointestinal: Negative for constipation, diarrhea, nausea and vomiting.  Endocrine: Negative for cold intolerance and heat intolerance.  Genitourinary: Negative for difficulty urinating and dysuria.  Musculoskeletal: Negative for arthralgias and gait problem.  Skin: Negative for rash and wound.  Allergic/Immunologic: Negative for food allergies and immunocompromised state.  Neurological: Positive for headaches. Negative for dizziness.  Hematological: Negative for adenopathy. Does not bruise/bleed easily.  Psychiatric/Behavioral: Positive for agitation, dysphoric mood, hallucinations and suicidal ideas. The patient is nervous/anxious.     Blood pressure 98/64, pulse (!) 102, temperature (!) 97.3 F (36.3 C), temperature source Oral, resp. rate 17, height  (1.803 m), weight 63.5 kg, last menstrual period 03/30/2020, SpO2 99 %.Body mass index is 19.53 kg/m.  General Appearance: Disheveled  Eye Contact:  Fair  Speech:  Slow  Volume:  Normal  Mood:  Depressed  Affect:  Blunted, anxious  Thought Process:  Disorganized  Orientation:  Full (Time, Place, and Person)  Thought Content:  Delusions, Hallucinations: Auditory, Paranoid Ideation and Rumination, AH at times  Suicidal Thoughts:  Yes.  without intent/plan  Homicidal Thoughts:  No  Memory:  Immediate;    Fair Recent;   Fair Remote;   Fair  Judgement:  Impaired  Insight:  Present  Psychomotor Activity:  Restlessness  Concentration:  Concentration: Poor and Attention Span: Poor  Recall:  [employer] of Knowledge:  Poor  Language:  Fair  Akathisia:  Negative  Handed:  Right  AIMS (if indicated):     Assets:  Desire for Improvement Housing Social Support  ADL's:  Impaired  Cognition:  Impaired,  Mild  Sleep:  Number of Hours: 8.25     Treatment Plan Summary: Daily contact with patient to assess and evaluate symptoms and progress in treatment and Medication management PLAN OF CARE:19 year old woman with a historydepression and OCDnot currently in any treatment. Immediate behavior consistent with acute psychosis. Patient is living with her family,not working,not in school,low functioning. As patient's psychosis has began to clear her diagnosis seems most consistent with MDD, recurrent, severe with psychotic features. However,differential diagnosis continues to includenew onset schizophrenia, bipolar disorder with psychosis. Seroquel not effective at 450 mg total dose, and have titrated off and discontinued. Plan-patient continues to be psychotic despite increase in Risperdal. -Decrease  Risperdal 2 mg QHS for psychosis with plan to titrate off and discontinue -Start Haldol 2 mg QHS, with plan to increase for psychosis. Patient appears to do better on days after receiving PRN Haldol/Ativan/Benadryl, QTC 459 -Continue Lovenox 50 mg twice daily for depression anxiety and OCD. -Continue  Haldol/Ativan/Bendaryl  PRN for acute agitation.  - Group and milieu treatment.  [name], MD 05/02/2020, 11:31 AMPatient ID: Robin Hess, female   DOB: 2001/03/15, 19 y.o.   MRN: [id number] Patient ID: Robin Hess, female   DOB: Oct 31, 2000, 19 y.o.   MRN: [id number]

## 2020-05-02 NOTE — Progress Notes (Signed)
Recreation Therapy Notes   Date: 05/02/2020  Time: 9:30 am   Location: Craft room     Behavioral response: N/A   Intervention Topic: Goal Setting  Discussion/Intervention: Patient did not attend group.   Clinical Observations/Feedback:  Patient did not attend group.   Tylon Kemmerling LRT/CTRS        Zoanne Newill 05/02/2020 11:53 AM

## 2020-05-02 NOTE — Progress Notes (Signed)
Patient spent most of shift resting in bed. She remains sad and depressed, she is medication compliant, fluids encouraged for patient because she has been laying in the bed not eating or drinking well. Seemed to sleep well through out the night.

## 2020-05-02 NOTE — [facility] Group Notes (Signed)
LCSW Group Therapy Note  05/02/2020 1:41 PM  Type of Therapy/Topic:  Group Therapy:  Emotion Regulation  Participation Level:  Did Not Attend   Description of Group:   The purpose of this group is to assist patients in learning to regulate negative emotions and experience positive emotions. Patients will be guided to discuss ways in which they have been vulnerable to their negative emotions. These vulnerabilities will be juxtaposed with experiences of positive emotions or situations, and patients will be challenged to use positive emotions to combat negative ones. Special emphasis will be placed on coping with negative emotions in conflict situations, and patients will process healthy conflict resolution skills.  Therapeutic Goals: 1. Patient will identify two positive emotions or experiences to reflect on in order to balance out negative emotions 2. Patient will label two or more emotions that they find the most difficult to experience 3. Patient will demonstrate positive conflict resolution skills through discussion and/or role plays  Summary of Patient Progress:  X  Therapeutic Modalities:   Cognitive Behavioral Therapy Feelings Identification Dialectical Behavioral Therapy  Fedor Kazmierski, MSW, LCSW 05/02/2020 1:41 PM  

## 2020-05-02 NOTE — Plan of Care (Signed)
Cooperative with treatment , medication compliant, patient spent most of shift resting in bed, she had minimal interaction with peers and staff. She remains sad, anxious and depressed. She denies SI & AVH. She is currently in bed resting quietly at this time.

## 2020-05-03 DIAGNOSIS — F23 Brief psychotic disorder: Secondary | ICD-10-CM | POA: Diagnosis not present

## 2020-05-03 MED ORDER — HALOPERIDOL 5 MG PO TABS
5.0000 mg | ORAL_TABLET | Freq: Every day | ORAL | Status: DC
  Administered 2020-05-03 – 2020-05-04 (×2): 5 mg via ORAL
  Filled 2020-05-03 (×2): qty 1

## 2020-05-03 MED ORDER — FLUVOXAMINE MALEATE 50 MG PO TABS
100.0000 mg | ORAL_TABLET | Freq: Every day | ORAL | Status: DC
  Administered 2020-05-03 – 2020-05-05 (×3): 100 mg via ORAL
  Filled 2020-05-03 (×4): qty 2

## 2020-05-03 MED ORDER — FLUVOXAMINE MALEATE 50 MG PO TABS
50.0000 mg | ORAL_TABLET | Freq: Every morning | ORAL | Status: DC
  Administered 2020-05-04 – 2020-05-06 (×3): 50 mg via ORAL
  Filled 2020-05-03 (×2): qty 1

## 2020-05-03 MED ORDER — RISPERIDONE 1 MG PO TBDP
1.0000 mg | ORAL_TABLET | Freq: Every day | ORAL | Status: DC
  Administered 2020-05-03 – 2020-05-05 (×3): 1 mg via ORAL
  Filled 2020-05-03 (×3): qty 1

## 2020-05-03 NOTE — Progress Notes (Signed)
Recreation Therapy Notes  Date: 05/03/2020  Time: 9:30 am  Location: Craft room    Behavioral response: Appropriate   Intervention Topic: Animal Assisted Therapy   Discussion/Intervention:  Animal Assisted Therapy took place today during group.  Animal Assisted Therapy is the planned inclusion of an animal in a patient's treatment plan. The patients were able to engage in therapy with an animal during group. Participants were educated on what a service dog is and the different between a support dog and a service dog. Patient were informed on the many animal needs there are and how their needs are similar. Individuals were enlightened on the process to get a service animal or support animal. Patients got the opportunity to pet the animal and were offered emotional support from the animal and staff.  Clinical Observations/Feedback:  Patient came to group and was on topic and was focused on what peers and staff had to say. Participant shared their experiences and history with animals. She expressed that she has two cats one that she has had for five years and one that she has had for 10 years. Individual was social with peers, staff and animal while participating in group.  Laketia Vicknair LRT/CTRS           Keane Martelli 05/03/2020 11:26 AM

## 2020-05-03 NOTE — Plan of Care (Signed)
Patient compliant with medication administration per MD orders  Problem: Health Behavior/Discharge Planning: Goal: Compliance with therapeutic regimen will improve Outcome: Progressing   

## 2020-05-03 NOTE — Progress Notes (Signed)
Patient calm and cooperative during assessment. Patient isolative to her room this evening. Patient stated she was feeling good this evening. Patient compliant with medication administration per MD orders. Patient given education, support, and encouragement to be active in her treatment plan. Patient being monitored Q 15 minutes for safety per unit protocol. Pt remains safe on the unit. 

## 2020-05-03 NOTE — Progress Notes (Signed)
[facility] MD Progress Note  05/03/2020 11:26 AM Robin Hess  MRN:  [id number]   Subjective:   "I'm scared"   19 year old woman brought into the hospital under IVC Patient was brought in by EMS after running away from family running out through the woods with bizarre talking behavior. Reported by EMS that the patient was muttering to herself about being guilty of something or of having killed someone.  Patient is seen for follow-up today.  Chart reviewed discussed with nursing staff. She was withdrawn to room majority of the day, and did not eat her meals. She required PRN hydroxyzine this morning for anxiety.   She is seen one-on-one in office today. She continues to have constricted affect, poor eye contact, and remains internally preoccupied. She continues to endorse auditory hallucinations. She continues to have difficulty telling what is real, and what is in her mind. She feels certain today that the staff are trying to hurt her, an keep her here in the hospital. She also feels that her family has abandoned her, and they are not visiting due to past things she has done. She continues to believe she has broken the law, and killed people prior to admission. Reassured patient that lack of visitation was due to covid restrictions. Also provided reassurance that she had not committed any crimes. Reviewed her diagnosis, and symptoms of psychosis. Patient seems somewhat more receptive to definition of psychosis today, and is able to pinpoint some thoughts that may not be reality based today. She continues to have passive suicidal thoughts today stating that she wished she would die because she has hurt so many people. She denies active plan or intent.    Principal Problem: Psychosis (HCC) Diagnosis: Principal Problem:   Psychosis (HCC) Active Problems:   Social anxiety disorder   OCD (obsessive compulsive disorder)  Total Time spent with patient: 45 min  Past Psychiatric History: No new  information , patient has had 2 prior hospitalizations in 2015 and 2019. Last diagnosis was depression and OCD. Did not follow-up with outpatient treatment. Not currently getting any outpatient treatment. Has made suicidal statements in the past. Recently had not been trying to hurt her self or showing any violence to others. Some history of cannabis use in the past but currently does not seem to be an active issue.  Past Medical History:  Past Medical History:  Diagnosis Date  . Allergy   . Anxiety   . Cannabis use disorder, moderate, dependence (HCC)   . Depression   . OCD (obsessive compulsive disorder)   . Severe major depression, single episode, with psychotic features (HCC)   . Social anxiety disorder    History reviewed. No pertinent surgical history. Family History: History reviewed. No pertinent family history. Family Psychiatric  History: None reported Social History:  Social History   Substance and Sexual Activity  Alcohol Use No     Social History   Substance and Sexual Activity  Drug Use Yes  . Types: Marijuana    Social History   Socioeconomic History  . Marital status: Single    Spouse name: Not on file  . Number of children: Not on file  . Years of education: Not on file  . Highest education level: Not on file  Occupational History  . Not on file  Tobacco Use  . Smoking status: Never Smoker  . Smokeless tobacco: Never Used  Substance and Sexual Activity  . Alcohol use: No  . Drug use: Yes    Types:  Marijuana  . Sexual activity: Never    Birth control/protection: None  Other Topics Concern  . Not on file  Social History Narrative  . Not on file   Social Determinants of Health   Financial Resource Strain: Not on file  Food Insecurity: Not on file  Transportation Needs: Not on file  Physical Activity: Not on file  Stress: Not on file  Social Connections: Not on file   Additional Social History:                         Sleep:  Fair  Appetite:  Fair  Current Medications: Current Facility-Administered Medications  Medication Dose Route Frequency Provider Last Rate Last Admin  . acetaminophen (TYLENOL) tablet 650 mg  650 mg Oral Q6H PRN Clapacs, Jackquline DenmarkJohn T, MD   650 mg at 05/03/20 0828  . alum & mag hydroxide-simeth (MAALOX/MYLANTA) 200-200-20 MG/5ML suspension 30 mL  30 mL Oral Q4H PRN Clapacs, [name], MD      . chlorproMAZINE (THORAZINE) injection 25 mg  25 mg Intramuscular TID PRN Jesse SansFreeman, Sitara Cashwell M, MD      . haloperidol (HALDOL) tablet 5 mg  5 mg Oral Q8H PRN Jesse SansFreeman, Ciera Beckum M, MD   5 mg at 04/29/20 1529   And  . LORazepam (ATIVAN) tablet 2 mg  2 mg Oral Q8H PRN Jesse SansFreeman, Marijayne Rauth M, MD   2 mg at 04/27/20 1627   And  . diphenhydrAMINE (BENADRYL) capsule 50 mg  50 mg Oral Q8H PRN Jesse SansFreeman, Kristain Hu M, MD   50 mg at 04/29/20 1529  . feeding supplement (ENSURE ENLIVE / ENSURE PLUS) liquid 237 mL  237 mL Oral BID BM Jesse SansFreeman, Deniss Wormley M, MD   237 mL at 05/03/20 1100  . fluvoxaMINE (LUVOX) tablet 50 mg  50 mg Oral BID Jesse SansFreeman, Thompson Mckim M, MD   50 mg at 05/03/20 [id number]  . haloperidol (HALDOL) tablet 5 mg  5 mg Oral QHS Jesse SansFreeman, Analeya Luallen M, MD      . hydrOXYzine (ATARAX/VISTARIL) tablet 50 mg  50 mg Oral TID PRN Clapacs, Jackquline DenmarkJohn T, MD   50 mg at 05/03/20 [id number]  . LORazepam (ATIVAN) tablet 2 mg  2 mg Oral Q6H PRN Jesse SansFreeman, Yanci Bachtell M, MD   2 mg at 04/29/20 1058   Or  . LORazepam (ATIVAN) injection 2 mg  2 mg Intramuscular Q6H PRN Jesse SansFreeman, Aksel Bencomo M, MD   2 mg at 04/26/20 1645  . magnesium hydroxide (MILK OF MAGNESIA) suspension 30 mL  30 mL Oral Daily PRN Clapacs, [name], MD      . risperiDONE (RISPERDAL M-TABS) disintegrating tablet 1 mg  1 mg Oral QHS Jesse SansFreeman, Kamar Callender M, MD      . sodium chloride (OCEAN) 0.65 % nasal spray 2 spray  2 spray Each Nare Q4H PRN Jesse SansFreeman, Leveta Wahab M, MD      . traZODone (DESYREL) tablet 100 mg  100 mg Oral QHS PRN Clapacs, Jackquline DenmarkJohn T, MD   100 mg at 04/30/20 2117    Lab Results:  No results found for this or any previous visit  (from the past 48 hour(s)).  Blood Alcohol level:  Lab Results  Component Value Date   [facility] <10 04/12/2020   ETH <10 03/03/2018    Metabolic Disorder Labs: Lab Results  Component Value Date   HGBA1C 4.7 (L) 04/13/2020   MPG 88.19 04/13/2020   MPG 93.93 03/03/2018   Lab Results  Component Value Date   PROLACTIN 17.5 05/02/2014  Lab Results  Component Value Date   CHOL 122 04/13/2020   TRIG 50 04/13/2020   HDL 48 04/13/2020   CHOLHDL 2.5 04/13/2020   VLDL 10 04/13/2020   LDLCALC 64 04/13/2020   LDLCALC 90 03/03/2018    Physical Findings: AIMS: Facial and Oral Movements Muscles of Facial Expression: None, normal Lips and Perioral Area: None, normal Jaw: None, normal Tongue: None, normal,Extremity Movements Upper (arms, wrists, hands, fingers): None, normal Lower (legs, knees, ankles, toes): None, normal, Trunk Movements Neck, shoulders, hips: None, normal, Overall Severity Severity of abnormal movements (highest score from questions above): None, normal Incapacitation due to abnormal movements: None, normal Patient's awareness of abnormal movements (rate only patient's report): No Awareness, Dental Status Current problems with teeth and/or dentures?: No  CIWA:  CIWA-Ar Total: 0 COWS:  COWS Total Score: 2  Musculoskeletal: Strength & Muscle Tone: within normal limits Gait & Station: normal Patient leans: N/A  Psychiatric Specialty Exam: Physical Exam Vitals and nursing note reviewed.  Constitutional:      General: She is not in acute distress.    Appearance: Normal appearance.  HENT:     Head: Normocephalic and atraumatic.     Right Ear: External ear normal.     Left Ear: External ear normal.     Nose: Nose normal.     Mouth/Throat:     Mouth: Mucous membranes are moist.     Pharynx: Oropharynx is clear.  Eyes:     Extraocular Movements: Extraocular movements intact.     Conjunctiva/sclera: Conjunctivae normal.  Cardiovascular:     Rate and Rhythm:  Normal rate.     Pulses: Normal pulses.  Pulmonary:     Effort: Pulmonary effort is normal.     Breath sounds: Normal breath sounds.  Abdominal:     General: Abdomen is flat.  Musculoskeletal:        General: No swelling. Normal range of motion.     Cervical back: Normal range of motion.  Neurological:     General: No focal deficit present.     Mental Status: She is alert.  Psychiatric:        Attention and Perception: She is attentive. She perceives auditory hallucinations.        Mood and Affect: Mood is depressed. Affect is flat.        Speech: Speech normal. Speech is not delayed.        Behavior: Behavior is cooperative.        Thought Content: Thought content is paranoid and delusional. Thought content includes suicidal ideation. Thought content does not include suicidal plan.        Cognition and Memory: Cognition is not impaired. Memory is not impaired.        Judgment: Judgment is impulsive.     Review of Systems  Constitutional: Positive for fatigue. Negative for appetite change.  HENT: Negative for rhinorrhea and sore throat.   Eyes: Negative for photophobia and visual disturbance.  Respiratory: Negative for cough and shortness of breath.   Cardiovascular: Negative for chest pain and palpitations.  Gastrointestinal: Negative for constipation, diarrhea, nausea and vomiting.  Endocrine: Negative for cold intolerance and heat intolerance.  Genitourinary: Negative for difficulty urinating and dysuria.  Musculoskeletal: Negative for arthralgias and gait problem.  Skin: Negative for rash and wound.  Allergic/Immunologic: Negative for food allergies and immunocompromised state.  Neurological: Positive for headaches. Negative for dizziness.  Hematological: Negative for adenopathy. Does not bruise/bleed easily.  Psychiatric/Behavioral: Positive for agitation, dysphoric  mood, hallucinations and suicidal ideas. The patient is nervous/anxious.     Blood pressure 92/69, pulse 79,  temperature (!) 97.5 F (36.4 C), temperature source Oral, resp. rate 18, height 5\' 11"  (1.803 m), weight 63.5 kg, last menstrual period 03/30/2020, SpO2 96 %.Body mass index is 19.53 kg/m.  General Appearance: Disheveled  Eye Contact:  Fair  Speech:  Slow  Volume:  Normal  Mood:  Depressed  Affect:  Blunted, anxious  Thought Process:  Disorganized  Orientation:  Full (Time, Place, and Person)  Thought Content:  Delusions, Hallucinations: Auditory, Paranoid Ideation and Rumination, AH at times  Suicidal Thoughts:  Yes.  without intent/plan  Homicidal Thoughts:  No  Memory:  Immediate;   Fair Recent;   Fair Remote;   Fair  Judgement:  Impaired  Insight:  Present  Psychomotor Activity:  Restlessness  Concentration:  Concentration: Poor and Attention Span: Poor  Recall:  13/09/2019 of Knowledge:  Poor  Language:  Fair  Akathisia:  Negative  Handed:  Right  AIMS (if indicated):     Assets:  Desire for Improvement Housing Social Support  ADL's:  Impaired  Cognition:  Impaired,  Mild  Sleep:  Number of Hours: 8     Treatment Plan Summary: Daily contact with patient to assess and evaluate symptoms and progress in treatment and Medication management PLAN OF CARE:19 year old woman with a historydepression and OCDnot currently in any treatment. Immediate behavior consistent with acute psychosis. Patient is living with her family,not working,not in school,low functioning. As patient's psychosis has began to clear her diagnosis seems most consistent with MDD, recurrent, severe with psychotic features. However,differential diagnosis continues to includenew onset schizophrenia, bipolar disorder with psychosis. Seroquel not effective at 450 mg total dose, and have titrated off and discontinued. Plan-patient continues to be psychotic despite increase in Risperdal. -Decrease  Risperdal 1 mg QHS for psychosis with plan to titrate off and discontinue -Increase Haldol 5 mg QHS, with  plan to titrate as needed for psychosis. Patient appears to do better on days after receiving PRN Haldol/Ativan/Benadryl, QTC 459 -Increase Luvox 50 mg in the morning, 100 mg QHS for depression anxiety and OCD. -Continue  Haldol/Ativan/Bendaryl PRN for acute agitation.  - Group and milieu treatment.  36, MD 05/03/2020, 11:26 AMPatient ID: [id number], female   DOB: Jul 15, 2000, 19 y.o.   MRN: [id number] Patient ID: Dametra Whetsel, female   DOB: 31-Dec-2000, 19 y.o.   MRN: [id number]

## 2020-05-03 NOTE — Plan of Care (Signed)
D- Patient alert and oriented. Patient presented in a sad, but pleasant mood on assessment stating that she slept ok last night, but had some complaints of a headache. Patient rated her pain a "10/10", in which she did request pain medication from this Clinical research associate. Patient endorsed passive SI stating "I feel alone", however, she did contract for safety. Patient also endorsed both depression/anxiety stating "I'm stuck here", is why she feels this way. Patient did receive PRN medication for anxiety twice throughout the shift. Patient denied HI/AVH. Patient had no stated goals for today.  A- Scheduled medications administered to patient, per MD orders. Support and encouragement provided.  Routine safety checks conducted every 15 minutes.  Patient informed to notify staff with problems or concerns.  R- No adverse drug reactions noted. Patient contracts for safety at this time. Patient compliant with medications and treatment plan. Patient receptive, calm, and cooperative. Patient interacts well with others on the unit.  Patient remains safe at this time.  Problem: Consults Goal: Concurrent Medical Patient Education Description: (See Patient Education Module for education specifics) Outcome: Not Progressing   Problem: [facility] Concurrent Medical Problem Goal: LTG-Pt will be physically stable and he/significant other Description: (Patient will be physically stable and he/significant other will be able to verbalize understanding of follow-up care and symptoms that would warrant further treatment) Outcome: Not Progressing Goal: STG-Vital signs will be within defined limits or stabilized Description: (STG- Vital signs will be within defined limits or stabilized for individual) Outcome: Not Progressing Goal: STG-Compliance with medication and/or treatment as ordered Description: (STG-Compliance with medication and/or treatment as ordered by MD) Outcome: Not Progressing Goal: STG-Verbalize two symptoms that would  warrant further Description: (STG-Verbalize two symptoms that would warrant further treatment) Outcome: Not Progressing Goal: STG-Patient will participate in management/stabilization Description: (STG-Patient will participate in management/stabilization of medical condition) Outcome: Not Progressing Goal: STG-Other (Specify): Description: STG-Other Concurrent Medical (Specify): Outcome: Not Progressing   Problem: Education: Goal: Utilization of techniques to improve thought processes will improve Outcome: Not Progressing Goal: Knowledge of the prescribed therapeutic regimen will improve Outcome: Not Progressing   Problem: Activity: Goal: Interest or engagement in leisure activities will improve Outcome: Not Progressing Goal: Imbalance in normal sleep/wake cycle will improve Outcome: Not Progressing   Problem: Coping: Goal: Coping ability will improve Outcome: Not Progressing Goal: Will verbalize feelings Outcome: Not Progressing   Problem: Health Behavior/Discharge Planning: Goal: Ability to make decisions will improve Outcome: Not Progressing Goal: Compliance with therapeutic regimen will improve Outcome: Not Progressing   Problem: Role Relationship: Goal: Will demonstrate positive changes in social behaviors and relationships Outcome: Not Progressing   Problem: Safety: Goal: Ability to disclose and discuss suicidal ideas will improve Outcome: Not Progressing Goal: Ability to identify and utilize support systems that promote safety will improve Outcome: Not Progressing   Problem: Self-Concept: Goal: Will verbalize positive feelings about self Outcome: Not Progressing Goal: Level of anxiety will decrease Outcome: Not Progressing   Problem: Education: Goal: Knowledge of [facility] Education information/materials will improve Outcome: Not Progressing Goal: Emotional status will improve Outcome: Not Progressing Goal: Mental status will improve Outcome:  Not Progressing Goal: Verbalization of understanding the information provided will improve Outcome: Not Progressing

## 2020-05-03 NOTE — Tx Team (Addendum)
Interdisciplinary Treatment and Diagnostic Plan Update  05/03/2020 Time of Session: 8:30 AM  Robin Hess MRN: [id number]  Principal Diagnosis: Psychosis [facility])  Secondary Diagnoses: Principal Problem:   Psychosis (HCC) Active Problems:   Social anxiety disorder   OCD (obsessive compulsive disorder)   Current Medications:  Current Facility-Administered Medications  Medication Dose Route Frequency Provider Last Rate Last Admin  . acetaminophen (TYLENOL) tablet 650 mg  650 mg Oral Q6H PRN Clapacs, [name], MD   650 mg at 05/03/20 0828  . alum & mag hydroxide-simeth (MAALOX/MYLANTA) 200-200-20 MG/5ML suspension 30 mL  30 mL Oral Q4H PRN Clapacs, [name], MD      . chlorproMAZINE (THORAZINE) injection 25 mg  25 mg Intramuscular TID PRN [name], MD      . haloperidol (HALDOL) tablet 5 mg  5 mg Oral Q8H PRN [name], MD   5 mg at 04/29/20 1529   And  . LORazepam (ATIVAN) tablet 2 mg  2 mg Oral Q8H PRN [name], MD   2 mg at 04/27/20 1627   And  . diphenhydrAMINE (BENADRYL) capsule 50 mg  50 mg Oral Q8H PRN [name], MD   50 mg at 04/29/20 1529  . feeding supplement (ENSURE ENLIVE / ENSURE PLUS) liquid 237 mL  237 mL Oral BID BM [name], MD   237 mL at 05/02/20 1358  . fluvoxaMINE (LUVOX) tablet 50 mg  50 mg Oral BID [name], MD   50 mg at 05/03/20 5093  . haloperidol (HALDOL) tablet 2 mg  2 mg Oral QHS [name], MD   2 mg at 05/02/20 2127  . hydrOXYzine (ATARAX/VISTARIL) tablet 50 mg  50 mg Oral TID PRN Clapacs, [name], MD   50 mg at 05/03/20 2671  . LORazepam (ATIVAN) tablet 2 mg  2 mg Oral Q6H PRN [name], MD   2 mg at 04/29/20 1058   Or  . LORazepam (ATIVAN) injection 2 mg  2 mg Intramuscular Q6H PRN [name], MD   2 mg at 04/26/20 1645  . magnesium hydroxide (MILK OF MAGNESIA) suspension 30 mL  30 mL Oral Daily PRN Clapacs, [name], MD      . risperiDONE (RISPERDAL M-TABS) disintegrating tablet 2 mg  2 mg Oral QHS  [name], MD   2 mg at 05/02/20 2128  . sodium chloride (OCEAN) 0.65 % nasal spray 2 spray  2 spray Each Nare Q4H PRN [name], MD      . traZODone (DESYREL) tablet 100 mg  100 mg Oral QHS PRN Clapacs, [name], MD   100 mg at 04/30/20 2117   PTA Medications: Medications Prior to Admission  Medication Sig Dispense Refill Last Dose  . fluvoxaMINE (LUVOX) 100 MG tablet Take 1 tablet (100 mg total) by mouth at bedtime. (Patient not taking: Reported on 04/12/2020) 30 tablet 1   . hydrOXYzine (ATARAX/VISTARIL) 50 MG tablet Take 1 tablet (50 mg total) by mouth 3 (three) times daily as needed for anxiety. (Patient not taking: Reported on 04/12/2020) 90 tablet 1   . lamoTRIgine (LAMICTAL) 25 MG tablet Take 1 tablet (25 mg total) by mouth at bedtime. Take 1 tab for two weeks, 2 tabs for 2 weeks, 4 tabs for 2 weeks, 8 tabs or 200 mg after (Patient not taking: Reported on 04/12/2020) 45 tablet 1   . prazosin (MINIPRESS) 1 MG capsule Take 1 capsule (1 mg total) by  mouth 2 (two) times daily. (Patient not taking: Reported on 04/12/2020) 60 capsule 1   . traZODone (DESYREL) 100 MG tablet Take 1 tablet (100 mg total) by mouth at bedtime as needed for sleep. (Patient not taking: Reported on 04/12/2020) 30 tablet 1     Patient Stressors:    Patient Strengths:    Treatment Modalities: Medication Management, Group therapy, Case management,  1 to 1 session with clinician, Psychoeducation, Recreational therapy.   Physician Treatment Plan for Primary Diagnosis: Psychosis (HCC) Long Term Goal(s): Improvement in symptoms so as ready for discharge Improvement in symptoms so as ready for discharge   Short Term Goals: Ability to identify changes in lifestyle to reduce recurrence of condition will improve Ability to verbalize feelings will improve Ability to disclose and discuss suicidal ideas Ability to demonstrate self-control will improve Ability to identify and develop effective coping behaviors  will improve Compliance with prescribed medications will improve Ability to identify changes in lifestyle to reduce recurrence of condition will improve Ability to verbalize feelings will improve Ability to disclose and discuss suicidal ideas Ability to demonstrate self-control will improve Ability to identify and develop effective coping behaviors will improve Compliance with prescribed medications will improve  Medication Management: Evaluate patient's response, side effects, and tolerance of medication regimen.  Therapeutic Interventions: 1 to 1 sessions, Unit Group sessions and Medication administration.  Evaluation of Outcomes: Not Progressing  Physician Treatment Plan for Secondary Diagnosis: Principal Problem:   Psychosis (HCC) Active Problems:   Social anxiety disorder   OCD (obsessive compulsive disorder)  Long Term Goal(s): Improvement in symptoms so as ready for discharge Improvement in symptoms so as ready for discharge   Short Term Goals: Ability to identify changes in lifestyle to reduce recurrence of condition will improve Ability to verbalize feelings will improve Ability to disclose and discuss suicidal ideas Ability to demonstrate self-control will improve Ability to identify and develop effective coping behaviors will improve Compliance with prescribed medications will improve Ability to identify changes in lifestyle to reduce recurrence of condition will improve Ability to verbalize feelings will improve Ability to disclose and discuss suicidal ideas Ability to demonstrate self-control will improve Ability to identify and develop effective coping behaviors will improve Compliance with prescribed medications will improve     Medication Management: Evaluate patient's response, side effects, and tolerance of medication regimen.  Therapeutic Interventions: 1 to 1 sessions, Unit Group sessions and Medication administration.  Evaluation of Outcomes: Not  Progressing   RN Treatment Plan for Primary Diagnosis: Psychosis (HCC) Long Term Goal(s): Knowledge of disease and therapeutic regimen to maintain health will improve  Short Term Goals: Ability to verbalize frustration and anger appropriately will improve, Ability to participate in decision making will improve, Ability to verbalize feelings will improve, Ability to identify and develop effective coping behaviors will improve and Compliance with prescribed medications will improve  Medication Management: RN will administer medications as ordered by provider, will assess and evaluate patient's response and provide education to patient for prescribed medication. RN will report any adverse and/or side effects to prescribing provider.  Therapeutic Interventions: 1 on 1 counseling sessions, Psychoeducation, Medication administration, Evaluate responses to treatment, Monitor vital signs and CBGs as ordered, Perform/monitor CIWA, COWS, AIMS and Fall Risk screenings as ordered, Perform wound care treatments as ordered.  Evaluation of Outcomes: Not Progressing   LCSW Treatment Plan for Primary Diagnosis: Psychosis (HCC) Long Term Goal(s): Safe transition to appropriate next level of care at discharge, Engage patient in therapeutic group addressing  interpersonal concerns.  Short Term Goals: Engage patient in aftercare planning with referrals and resources, Increase social support, Increase ability to appropriately verbalize feelings, Facilitate acceptance of mental health diagnosis and concerns, Identify triggers associated with mental health/substance abuse issues and Increase skills for wellness and recovery  Therapeutic Interventions: Assess for all discharge needs, 1 to 1 time with Social worker, Explore available resources and support systems, Assess for adequacy in community support network, Educate family and significant other(s) on suicide prevention, Complete Psychosocial Assessment, Interpersonal  group therapy.  Evaluation of Outcomes: Not Progressing   Progress in Treatment: Attending groups: No. Patient has attending some groups. Participating in groups: No. Usually just sits in the group with little participation unless called on to speak. Taking medication as prescribed: Yes. Toleration medication: Yes. Family/Significant other contact made: Yes, individual(s) contacted:  Ernestina Patches, Father  Patient understands diagnosis: No. Discussing patient identified problems/goals with staff: No. Medical problems stabilized or resolved: Yes. Denies suicidal/homicidal ideation: Yes. Issues/concerns per patient self-inventory: No. Other: None  New problem(s) identified: No, Describe:  None   New Short Term/Long Term Goal(s): Elimination of symptoms of psychosis, medication management for mood stabilization; elimination of SI thoughts; development of comprehensive mental wellness/sobriety plan. Update (04/18/20) Patient was able to have brief (30 minutes) conversation with physician. Update 04/23/20: No changes at this time. Update 04/28/2020: No changes at this time Update 05/03/20: No changes at this time.  Patient Goals:  "Work on myself." "Update (04/18/20) nothing significant to report. Update 04/23/20: No changes at this time. Update 04/28/20: No changes at this time Update 05/03/20: No changes at this time.   Discharge Plan or Barriers:  CSW will assist with aftercare planning as needed. Update 04/23/20: No changes at this time. Update 04/28/2020: No changes at this time Update 05/03/20: No changes at this time.  Reason for Continuation of Hospitalization: Anxiety Delusions  Depression Hallucinations Medication stabilization  Estimated Length of Stay: TBD   Attendees: Patient: 05/03/2020 9:58 AM  Physician: Les Pou, MD 05/03/2020 9:58 AM  Nursing:  05/03/2020 9:58 AM  RN Care Manager: 05/03/2020 9:58 AM  Social Worker: Vilma Meckel. Algis Greenhouse, MSW, [place], LCAS 05/03/2020 9:58 AM   Recreational Therapist:  05/03/2020 9:58 AM  Other: Penni Homans, MSW, LCSW 05/03/2020 9:58 AM  Other: Gwenevere Ghazi, MSW, [place], Bridget Hartshorn 05/03/2020 9:58 AM  Other: 05/03/2020 9:58 AM    Scribe for Treatment Team: Glenis Smoker, LCSW 05/03/2020 9:58 AM

## 2020-05-03 NOTE — [facility] Group Notes (Signed)
LCSW Group Therapy Note  05/03/2020 1:21 PM  Type of Therapy/Topic:  Group Therapy:  Balance in Life  Participation Level:  Did Not Attend  Description of Group:    This group will address the concept of balance and how it feels and looks when one is unbalanced. Patients will be encouraged to process areas in their lives that are out of balance and identify reasons for remaining unbalanced. Facilitators will guide patients in utilizing problem-solving interventions to address and correct the stressor making their life unbalanced. Understanding and applying boundaries will be explored and addressed for obtaining and maintaining a balanced life. Patients will be encouraged to explore ways to assertively make their unbalanced needs known to significant others in their lives, using other group members and facilitator for support and feedback.  Therapeutic Goals: 1. Patient will identify two or more emotions or situations they have that consume much of in their lives. 2. Patient will identify signs/triggers that life has become out of balance:  3. Patient will identify two ways to set boundaries in order to achieve balance in their lives:  4. Patient will demonstrate ability to communicate their needs through discussion and/or role plays  Summary of Patient Progress: X  Therapeutic Modalities:   Cognitive Behavioral Therapy Solution-Focused Therapy Assertiveness Training  Simona Huh R. Algis Greenhouse, MSW, LCSW, LCAS 05/03/2020 1:21 PM

## 2020-05-04 NOTE — Progress Notes (Signed)
[facility] MD Progress Note  05/04/2020 12:55 PM Robin Hess  MRN:  [id number]   Subjective:   "I'm scared"   19 year old woman brought into the hospital under IVC Patient was brought in by EMS after running away from family running out through the woods with bizarre talking behavior. Reported by EMS that the patient was muttering to herself about being guilty of something or of having killed someone.  Patient is seen for follow-up today.  Chart reviewed discussed with nursing staff. She was withdrawn to room majority of the day, but has resumed eating meals. She required PRN hydroxyzine and PRN ativan yesterday due to anxiety.   She is seen one-on-one in office today. She continues to have constricted affect, poor eye contact, and remains internally preoccupied. She continues to endorse auditory hallucinations. She notes the voices tell her people are rioting against her outside, and telling her she should go ahead and die. Due to voices, she is having intermittent passive suicidal ideations, but contracts for safety. She continues to have difficulty telling what is real, and what is in her mind. Provided reassurance that she had not committed any crimes. Reviewed her diagnosis, and symptoms of psychosis.   Principal Problem: Psychosis (HCC) Diagnosis: Principal Problem:   Psychosis (HCC) Active Problems:   Social anxiety disorder   OCD (obsessive compulsive disorder)  Total Time spent with patient: 45 min  Past Psychiatric History: No new information , patient has had 2 prior hospitalizations in 2015 and 2019. Last diagnosis was depression and OCD. Did not follow-up with outpatient treatment. Not currently getting any outpatient treatment. Has made suicidal statements in the past. Recently had not been trying to hurt her self or showing any violence to others. Some history of cannabis use in the past but currently does not seem to be an active issue.  Past Medical History:  Past  Medical History:  Diagnosis Date  . Allergy   . Anxiety   . Cannabis use disorder, moderate, dependence (HCC)   . Depression   . OCD (obsessive compulsive disorder)   . Severe major depression, single episode, with psychotic features (HCC)   . Social anxiety disorder    History reviewed. No pertinent surgical history. Family History: History reviewed. No pertinent family history. Family Psychiatric  History: None reported Social History:  Social History   Substance and Sexual Activity  Alcohol Use No     Social History   Substance and Sexual Activity  Drug Use Yes  . Types: Marijuana    Social History   Socioeconomic History  . Marital status: Single    Spouse name: Not on file  . Number of children: Not on file  . Years of education: Not on file  . Highest education level: Not on file  Occupational History  . Not on file  Tobacco Use  . Smoking status: Never Smoker  . Smokeless tobacco: Never Used  Substance and Sexual Activity  . Alcohol use: No  . Drug use: Yes    Types: Marijuana  . Sexual activity: Never    Birth control/protection: None  Other Topics Concern  . Not on file  Social History Narrative  . Not on file   Social Determinants of Health   Financial Resource Strain: Not on file  Food Insecurity: Not on file  Transportation Needs: Not on file  Physical Activity: Not on file  Stress: Not on file  Social Connections: Not on file   Additional Social History:  Sleep: Fair  Appetite:  Fair  Current Medications: Current Facility-Administered Medications  Medication Dose Route Frequency Provider Last Rate Last Admin  . acetaminophen (TYLENOL) tablet 650 mg  650 mg Oral Q6H PRN Clapacs, [name], MD   650 mg at 05/04/20 5102  . alum & mag hydroxide-simeth (MAALOX/MYLANTA) 200-200-20 MG/5ML suspension 30 mL  30 mL Oral Q4H PRN Clapacs, [name], MD      . chlorproMAZINE (THORAZINE) injection 25 mg  25 mg  Intramuscular TID PRN [name], MD      . haloperidol (HALDOL) tablet 5 mg  5 mg Oral Q8H PRN [name], MD   5 mg at 04/29/20 1529   And  . LORazepam (ATIVAN) tablet 2 mg  2 mg Oral Q8H PRN [name], MD   2 mg at 04/27/20 1627   And  . diphenhydrAMINE (BENADRYL) capsule 50 mg  50 mg Oral Q8H PRN [name], MD   50 mg at 04/29/20 1529  . feeding supplement (ENSURE ENLIVE / ENSURE PLUS) liquid 237 mL  237 mL Oral BID BM [name], MD   237 mL at 05/04/20 1000  . fluvoxaMINE (LUVOX) tablet 100 mg  100 mg Oral QHS [name], MD   100 mg at 05/03/20 2108  . fluvoxaMINE (LUVOX) tablet 50 mg  50 mg Oral q AM [name], MD   50 mg at 05/04/20 5852  . haloperidol (HALDOL) tablet 5 mg  5 mg Oral QHS [name], MD   5 mg at 05/03/20 2107  . hydrOXYzine (ATARAX/VISTARIL) tablet 50 mg  50 mg Oral TID PRN Clapacs, [name], MD   50 mg at 05/03/20 7782  . LORazepam (ATIVAN) tablet 2 mg  2 mg Oral Q6H PRN [name], MD   2 mg at 05/03/20 1315   Or  . LORazepam (ATIVAN) injection 2 mg  2 mg Intramuscular Q6H PRN [name], MD   2 mg at 04/26/20 1645  . magnesium hydroxide (MILK OF MAGNESIA) suspension 30 mL  30 mL Oral Daily PRN Clapacs, [name], MD      . risperiDONE (RISPERDAL M-TABS) disintegrating tablet 1 mg  1 mg Oral QHS [name], MD   1 mg at 05/03/20 2108  . sodium chloride (OCEAN) 0.65 % nasal spray 2 spray  2 spray Each Nare Q4H PRN [name], MD      . traZODone (DESYREL) tablet 100 mg  100 mg Oral QHS PRN Clapacs, [name], MD   100 mg at 05/03/20 2107    Lab Results:  No results found for this or any previous visit (from the past 48 hour(s)).  Blood Alcohol level:  Lab Results  Component Value Date   ETH <10 04/12/2020   ETH <10 03/03/2018    Metabolic Disorder Labs: Lab Results  Component Value Date   HGBA1C 4.7 (L) 04/13/2020   MPG 88.19 04/13/2020   MPG 93.93 03/03/2018   Lab Results  Component  Value Date   PROLACTIN 17.5 05/02/2014   Lab Results  Component Value Date   CHOL 122 04/13/2020   TRIG 50 04/13/2020   HDL 48 04/13/2020   CHOLHDL 2.5 04/13/2020   VLDL 10 04/13/2020   LDLCALC 64 04/13/2020   LDLCALC 90 03/03/2018    Physical Findings: AIMS: Facial and Oral Movements Muscles of Facial Expression: None, normal Lips and Perioral Area: None, normal Jaw: None, normal Tongue: None, normal,Extremity Movements  Upper (arms, wrists, hands, fingers): None, normal Lower (legs, knees, ankles, toes): None, normal, Trunk Movements Neck, shoulders, hips: None, normal, Overall Severity Severity of abnormal movements (highest score from questions above): None, normal Incapacitation due to abnormal movements: None, normal Patient's awareness of abnormal movements (rate only patient's report): No Awareness, Dental Status Current problems with teeth and/or dentures?: No  CIWA:  CIWA-Ar Total: 0 COWS:  COWS Total Score: 2  Musculoskeletal: Strength & Muscle Tone: within normal limits Gait & Station: normal Patient leans: N/A  Psychiatric Specialty Exam: Physical Exam Vitals and nursing note reviewed.  Constitutional:      General: She is not in acute distress.    Appearance: Normal appearance.  HENT:     Head: Normocephalic and atraumatic.     Right Ear: External ear normal.     Left Ear: External ear normal.     Nose: Nose normal.     Mouth/Throat:     Mouth: Mucous membranes are moist.     Pharynx: Oropharynx is clear.  Eyes:     Extraocular Movements: Extraocular movements intact.     Conjunctiva/sclera: Conjunctivae normal.  Cardiovascular:     Rate and Rhythm: Normal rate.     Pulses: Normal pulses.  Pulmonary:     Effort: Pulmonary effort is normal.     Breath sounds: Normal breath sounds.  Abdominal:     General: Abdomen is flat.  Musculoskeletal:        General: No swelling. Normal range of motion.     Cervical back: Normal range of motion.   Neurological:     General: No focal deficit present.     Mental Status: She is alert.  Psychiatric:        Attention and Perception: She is attentive. She perceives auditory hallucinations.        Mood and Affect: Mood is depressed. Affect is flat.        Speech: Speech normal. Speech is not delayed.        Behavior: Behavior is cooperative.        Thought Content: Thought content is paranoid and delusional. Thought content includes suicidal ideation. Thought content does not include suicidal plan.        Cognition and Memory: Cognition is not impaired. Memory is not impaired.        Judgment: Judgment is impulsive.     Review of Systems  Constitutional: Positive for fatigue. Negative for appetite change.  HENT: Negative for rhinorrhea and sore throat.   Eyes: Negative for photophobia and visual disturbance.  Respiratory: Negative for cough and shortness of breath.   Cardiovascular: Negative for chest pain and palpitations.  Gastrointestinal: Negative for constipation, diarrhea, nausea and vomiting.  Endocrine: Negative for cold intolerance and heat intolerance.  Genitourinary: Negative for difficulty urinating and dysuria.  Musculoskeletal: Negative for arthralgias and gait problem.  Skin: Negative for rash and wound.  Allergic/Immunologic: Negative for food allergies and immunocompromised state.  Neurological: Positive for headaches. Negative for dizziness.  Hematological: Negative for adenopathy. Does not bruise/bleed easily.  Psychiatric/Behavioral: Positive for agitation, dysphoric mood, hallucinations and suicidal ideas. The patient is nervous/anxious.     Blood pressure 119/76, pulse (!) 59, temperature (!) 97.5 F (36.4 C), temperature source Oral, resp. rate 18, height 5\' 11"  (1.803 m), weight 63.5 kg, last menstrual period 03/30/2020, SpO2 99 %.Body mass index is 19.53 kg/m.  General Appearance: Disheveled  Eye Contact:  Fair  Speech:  Slow  Volume:  Normal  Mood:  Depressed  Affect:  Blunted, anxious  Thought Process:  Disorganized  Orientation:  Full (Time, Place, and Person)  Thought Content:  Delusions, Hallucinations: Auditory, Paranoid Ideation and Rumination, AH at times  Suicidal Thoughts:  Yes.  without intent/plan  Homicidal Thoughts:  No  Memory:  Immediate;   Fair Recent;   Fair Remote;   Fair  Judgement:  Impaired  Insight:  Present  Psychomotor Activity:  Restlessness  Concentration:  Concentration: Poor and Attention Span: Poor  Recall:  [employer]Fair  Fund of Knowledge:  Poor  Language:  Fair  Akathisia:  Negative  Handed:  Right  AIMS (if indicated):     Assets:  Desire for Improvement Housing Social Support  ADL's:  Impaired  Cognition:  Impaired,  Mild  Sleep:  Number of Hours: 9     Treatment Plan Summary: Daily contact with patient to assess and evaluate symptoms and progress in treatment and Medication management PLAN OF CARE:19 year old woman with a historydepression and OCDnot currently in any treatment. Immediate behavior consistent with acute psychosis. Patient is living with her family,not working,not in school,low functioning. As patient's psychosis has began to clear her diagnosis seems most consistent with MDD, recurrent, severe with psychotic features. However,differential diagnosis continues to includenew onset schizophrenia, bipolar disorder with psychosis. Seroquel not effective at 450 mg total dose, and have titrated off and discontinued. Plan-patient continues to be psychotic despite increase in Risperdal. -Continue  Risperdal 1 mg QHS for psychosis with plan to titrate off and discontinue -Continue Haldol 5 mg QHS, with plan to titrate as needed for psychosis. Patient appears to do better on days after receiving PRN Haldol/Ativan/Benadryl, QTC 459 -Continue Luvox 50 mg in the morning, 100 mg QHS for depression anxiety and OCD. -Continue  Haldol/Ativan/Bendaryl PRN for acute agitation.  - Group and  milieu treatment.  Jesse SansMegan M Minela [place], MD 05/04/2020, 12:55 PMPatient ID: Robin AshAlicia Hess, female   DOB: 07/19/2000, 19 y.o.   MRN: [id number] Patient ID: Robin Ashlicia Hess, female   DOB: 07/19/2000, 19 y.o.   MRN: [id number]

## 2020-05-04 NOTE — Progress Notes (Signed)
Recreation Therapy Notes  Date: 05/04/2020  Time: 9:30 am   Location: Craft room     Behavioral response: N/A   Intervention Topic: Decision Making   Discussion/Intervention: Patient did not attend group.   Clinical Observations/Feedback:  Patient did not attend group.   Ashleigh Luckow LRT/CTRS         Hajra Port 05/04/2020 12:29 PM

## 2020-05-04 NOTE — Plan of Care (Signed)
Patient compliant with medication administration per MD orders  Problem: Health Behavior/Discharge Planning: Goal: Compliance with therapeutic regimen will improve Outcome: Progressing   

## 2020-05-04 NOTE — Progress Notes (Signed)
Patient calm and cooperative during assessment. Patient isolative to her room this evening. Patient stated she was feeling good this evening. Patient compliant with medication administration per MD orders. Patient given education, support, and encouragement to be active in her treatment plan. Patient being monitored Q 15 minutes for safety per unit protocol. Pt remains safe on the unit.

## 2020-05-04 NOTE — Progress Notes (Signed)
D: Pt alert and oriented. Pt denies experiencing any anxiety/depression at this time. Pt reports experiencing 8/10 headache pain, prn meds given. Pt denies experiencing any SI/HI at this time, however states that she thinks she maybe experiencing some AVH but doesn't know. When asked what she thinks she maybe hearing or seeing the pt becomes quite and gives no details. Fluids and food have been encouraged throughout the day. Pt's BP appears to be WNL. Pt's eating habits are still poor however. Pt also took a shower today and put on clean scrubs while her clothing is being washed.  A: Scheduled medications administered to pt, per MD orders. Support and encouragement provided. Frequent verbal contact made. Routine safety checks conducted q15 minutes.   R: No adverse drug reactions noted. Pt verbally contracts for safety at this time. Pt complaint with medications. Pt interacts minimally with others on the unit. Pt remains safe at this time. Will continue to monitor.

## 2020-05-05 MED ORDER — HALOPERIDOL 5 MG PO TABS
7.5000 mg | ORAL_TABLET | Freq: Every day | ORAL | Status: DC
  Administered 2020-05-05: 20:00:00 7.5 mg via ORAL
  Filled 2020-05-05: qty 2

## 2020-05-05 NOTE — [facility] Group Notes (Signed)
[facility] Group Notes: (Clinical Social Work)   05/05/2020      Type of Therapy:  Group Therapy   Participation Level:  Did Not Attend - was invited individually by Nurse/MHT and chose not to attend.   Susa Simmonds, LCSWA 05/05/2020  3:05 PM

## 2020-05-05 NOTE — Plan of Care (Signed)
Patient isolates in her room.Affect is sad and depressed.Patient states " I am scared. My parents don't sound like the same. I did a lot of bad things before I came here." Patient denies SI,HI and AVH.Stated that she feels tired.Appetite fair.Compliant with medications.Support and encouragement given.

## 2020-05-05 NOTE — Plan of Care (Signed)
  Problem: Coping: Goal: Coping ability will improve Outcome: Progressing Goal: Will verbalize feelings Outcome: Not Progressing   Problem: Health Behavior/Discharge Planning: Goal: Compliance with therapeutic regimen will improve Outcome: Progressing   Problem: Safety: Goal: Ability to disclose and discuss suicidal ideas will improve Outcome: Progressing
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
## 2020-05-05 NOTE — Progress Notes (Signed)
[facility] MD Progress Note  05/05/2020 1:04 PM Therisa Mennella  MRN:  [id number]  Principal Problem: Psychosis (HCC) Diagnosis: Principal Problem:   Psychosis (HCC) Active Problems:   Social anxiety disorder   OCD (obsessive compulsive disorder)  Robin Hess is a  19y.o. female patient who presents to the [facility] unit for treatment of psychosis.  Interval History Patient was seen today for re-evaluation.  Nursing reports no events overnight. The patient has no issues with performing ADLs.  Patient has been medication compliant.   Subjective:  On assessment patient appears anxious and reports "something strange is going on". She feels "somewhat safe, not really" in the hospital, cannot explain what is making her feel unsafe. She is "sad" and homesick; she wish she can be discharged, at the same time, she reports feeling like "my parents are not really my parents" and feels like it is not safe for her to return back home. She denies feeling suicidal or homicidal. Denies hallucinations today. Reports no side effects from medications, except of tiredness. She is agreeable with the increase of the antipsychotic dose tonight. She denies any physical complaints. She eats hospital food and reports she took shower yesterday.   Labs: no new results for review.  Total Time spent with patient: 20 minutes  Past Psychiatric History: see H&P   Past Medical History:  Past Medical History:  Diagnosis Date  . Allergy   . Anxiety   . Cannabis use disorder, moderate, dependence (HCC)   . Depression   . OCD (obsessive compulsive disorder)   . Severe major depression, single episode, with psychotic features (HCC)   . Social anxiety disorder    History reviewed. No pertinent surgical history. Family History: History reviewed. No pertinent family history. Family Psychiatric  History: H&P Social History:  Social History   Substance and Sexual Activity  Alcohol Use No     Social History   Substance and Sexual  Activity  Drug Use Yes  . Types: Marijuana    Social History   Socioeconomic History  . Marital status: Single    Spouse name: Not on file  . Number of children: Not on file  . Years of education: Not on file  . Highest education level: Not on file  Occupational History  . Not on file  Tobacco Use  . Smoking status: Never Smoker  . Smokeless tobacco: Never Used  Substance and Sexual Activity  . Alcohol use: No  . Drug use: Yes    Types: Marijuana  . Sexual activity: Never    Birth control/protection: None  Other Topics Concern  . Not on file  Social History Narrative  . Not on file   Social Determinants of Health   Financial Resource Strain: Not on file  Food Insecurity: Not on file  Transportation Needs: Not on file  Physical Activity: Not on file  Stress: Not on file  Social Connections: Not on file   Additional Social History:                         Sleep: Fair  Appetite:  Fair  Current Medications: Current Facility-Administered Medications  Medication Dose Route Frequency Provider Last Rate Last Admin  . acetaminophen (TYLENOL) tablet 650 mg  650 mg Oral Q6H PRN Clapacs, [name], MD   650 mg at 05/04/20 4696  . alum & mag hydroxide-simeth (MAALOX/MYLANTA) 200-200-20 MG/5ML suspension 30 mL  30 mL Oral Q4H PRN Clapacs, [name], MD      .  chlorproMAZINE (THORAZINE) injection 25 mg  25 mg Intramuscular TID PRN [name], MD      . haloperidol (HALDOL) tablet 5 mg  5 mg Oral Q8H PRN [name], MD   5 mg at 04/29/20 1529   And  . LORazepam (ATIVAN) tablet 2 mg  2 mg Oral Q8H PRN [name], MD   2 mg at 04/27/20 1627   And  . diphenhydrAMINE (BENADRYL) capsule 50 mg  50 mg Oral Q8H PRN [name], MD   50 mg at 04/29/20 1529  . feeding supplement (ENSURE ENLIVE / ENSURE PLUS) liquid 237 mL  237 mL Oral BID BM [name], MD   237 mL at 05/05/20 1011  . fluvoxaMINE (LUVOX) tablet 100 mg  100 mg Oral QHS [name],  MD   100 mg at 05/04/20 2120  . fluvoxaMINE (LUVOX) tablet 50 mg  50 mg Oral q AM [name], MD   50 mg at 05/05/20 0757  . haloperidol (HALDOL) tablet 7.5 mg  7.5 mg Oral QHS Babe Anthis, Serina Cowper, MD      . hydrOXYzine (ATARAX/VISTARIL) tablet 50 mg  50 mg Oral TID PRN Clapacs, [name], MD   50 mg at 05/03/20 7026  . LORazepam (ATIVAN) tablet 2 mg  2 mg Oral Q6H PRN [name], MD   2 mg at 05/03/20 1315   Or  . LORazepam (ATIVAN) injection 2 mg  2 mg Intramuscular Q6H PRN [name], MD   2 mg at 04/26/20 1645  . magnesium hydroxide (MILK OF MAGNESIA) suspension 30 mL  30 mL Oral Daily PRN Clapacs, [name], MD      . risperiDONE (RISPERDAL M-TABS) disintegrating tablet 1 mg  1 mg Oral QHS [name], MD   1 mg at 05/04/20 2120  . sodium chloride (OCEAN) 0.65 % nasal spray 2 spray  2 spray Each Nare Q4H PRN [name], MD      . traZODone (DESYREL) tablet 100 mg  100 mg Oral QHS PRN Clapacs, [name], MD   100 mg at 05/04/20 2120    Lab Results: No results found for this or any previous visit (from the past 48 hour(s)).  Blood Alcohol level:  Lab Results  Component Value Date   ETH <10 04/12/2020   ETH <10 03/03/2018    Metabolic Disorder Labs: Lab Results  Component Value Date   HGBA1C 4.7 (L) 04/13/2020   MPG 88.19 04/13/2020   MPG 93.93 03/03/2018   Lab Results  Component Value Date   PROLACTIN 17.5 05/02/2014   Lab Results  Component Value Date   CHOL 122 04/13/2020   TRIG 50 04/13/2020   HDL 48 04/13/2020   CHOLHDL 2.5 04/13/2020   VLDL 10 04/13/2020   LDLCALC 64 04/13/2020   LDLCALC 90 03/03/2018    Physical Findings: AIMS: Facial and Oral Movements Muscles of Facial Expression: None, normal Lips and Perioral Area: None, normal Jaw: None, normal Tongue: None, normal,Extremity Movements Upper (arms, wrists, hands, fingers): None, normal Lower (legs, knees, ankles, toes): None, normal, Trunk Movements Neck, shoulders, hips: None, normal,  Overall Severity Severity of abnormal movements (highest score from questions above): None, normal Incapacitation due to abnormal movements: None, normal Patient's awareness of abnormal movements (rate only patient's report): No Awareness, Dental Status Current problems with teeth and/or dentures?: No  CIWA:  CIWA-Ar Total: 0 COWS:  COWS Total Score: 2  Musculoskeletal: Strength & Muscle Tone:  within normal limits Gait & Station: walks on tiptoes Patient leans: N/A  Psychiatric Specialty Exam: Physical Exam  Review of Systems  Blood pressure 119/76, pulse (!) 59, temperature (!) 97.5 F (36.4 C), temperature source Oral, resp. rate 18, height 5\' 11"  (1.803 m), weight 63.5 kg, last menstrual period 03/30/2020, SpO2 99 %.Body mass index is 19.53 kg/m.  General Appearance: Disheveled  Eye Contact:  Fair  Speech:  Slow  Volume:  Decreased  Mood:  Depressed  Affect:  Flat  Thought Process:  Disorganized  Orientation:  Full (Time, Place, and Person)  Thought Content:  Illogical, Delusions, Hallucinations: occasional, not today, Paranoid Ideation and Rumination  Suicidal Thoughts:  No  Homicidal Thoughts:  No  Memory:  Immediate;   Fair Recent;   Fair Remote;   Fair  Judgement:  Impaired  Insight:  limited  Psychomotor Activity:  Restlessness  Concentration:  Concentration: Fair and Attention Span: Fair  Recall:  13/09/2019 of Knowledge:  Fair  Language:  Fair  Akathisia:  No  Handed:  Right  AIMS (if indicated):     Assets:  Desire for Improvement Housing Social Support  ADL's:  impaired  Cognition:  Impaired,  Mild  Sleep:  Number of Hours: 9     Treatment Plan Summary: Daily contact with patient to assess and evaluate symptoms and progress in treatment and Medication management   Patient is a 19 year old female with the above-stated past psychiatric history who is seen in follow-up.  Chart reviewed. Patient discussed with nursing. Patient continues to express  psychotic symptoms, depression, anxiety.  Plan: -continue inpatient psych admission; 15-minute checks; daily contact with patient to assess and evaluate symptoms and progress in treatment; psychoeducation. Medications: -Continue  Risperdal 1 mg QHS for psychosis with plan to titrate off and discontinue -Increase Haldol to 7.5 mg QHS for psychosis. -Continue Luvox 50 mg in the morning, 100 mg QHS for depression anxiety and OCD. -Continue  Haldol/Ativan/Bendaryl PRN for acute agitation.  -Pertinent Labs: no new labs ordered today -EKG: on 11/19 showed Qtc 12/19 with normal sinus rhythm -Consults: No new consults placed since yesterday  -Disposition: patient is not ready for d/c at this time yet. - I certify that the patient does need, on a daily basis, active treatment furnished directly by or requiring the supervision of inpatient psychiatric facility personnel.   , MD 05/05/2020, 1:04 PM

## 2020-05-06 MED ORDER — FLUVOXAMINE MALEATE 50 MG PO TABS
100.0000 mg | ORAL_TABLET | Freq: Two times a day (BID) | ORAL | Status: DC
  Administered 2020-05-06 – 2020-05-11 (×9): 100 mg via ORAL
  Filled 2020-05-06 (×9): qty 2

## 2020-05-06 MED ORDER — HALOPERIDOL 5 MG PO TABS
10.0000 mg | ORAL_TABLET | Freq: Every day | ORAL | Status: DC
  Administered 2020-05-06 – 2020-05-07 (×2): 10 mg via ORAL
  Filled 2020-05-06 (×2): qty 2

## 2020-05-06 NOTE — Progress Notes (Signed)
EKG obtained and placed in patient chart.

## 2020-05-06 NOTE — Progress Notes (Addendum)
D: Pt reports passive SI. She denies HI and AVH. However, she reported delusions and denied SI to MD. Pt affect is flat, blunted, and depressed. Her speech is soft and slow and interaction is guarded. She reported a headache in the morning and was given Tylenol. She also reported anxiety. When asked about what she was feeling anxious about, she said "I miss my family". BP was 98/56 in the morning and 73/53 at lunchtime, but went up to 112/66 in the afternoon after lunch and taking fluids. She is cooperative with nursing interventions and medications.  A: Pt was given medications as ordered. She was encouraged to drink Gatorade and her Ensure to prevent dehydration. Support and encouragement was provided as needed. Q15 safety checks were maintained.  R: Pt continues to isolate in her room and comes out only for medications and for breakfast and lunch. Staff will continue to monitor for changes and ensure safety on the unit.

## 2020-05-06 NOTE — Progress Notes (Signed)
[facility] MD Progress Note  05/06/2020 11:23 AM Robin Hess  MRN:  [id number]  Principal Problem: Psychosis (HCC) Diagnosis: Principal Problem:   Psychosis (HCC) Active Problems:   Social anxiety disorder   OCD (obsessive compulsive disorder)  Robin Hess is a  19y.o. female patient who presents to the [facility] unit for treatment of psychosis.  Interval History Patient was seen today for re-evaluation.  Nursing reports no events overnight. Patient has been medication compliant.   Subjective:  On assessment patient appears anxious and continues to report delusions that "something bad can happen, strange is going on". She feels "I don`t know if I am safe" in the hospital. Continues to express delusions that "my parents are not really my parents". She denies feeling suicidal or homicidal. Denies hallucinations. Reports no side effects from medications, except of tiredness. She is agreeable with the increase of the antipsychotic and antidepressant dose. She denies any physical complaints. She eats hospital food and reports she took shower yesterday.   Labs: no new results for review.  Total Time spent with patient: 20 minutes  Past Psychiatric History: see H&P   Past Medical History:  Past Medical History:  Diagnosis Date  . Allergy   . Anxiety   . Cannabis use disorder, moderate, dependence (HCC)   . Depression   . OCD (obsessive compulsive disorder)   . Severe major depression, single episode, with psychotic features (HCC)   . Social anxiety disorder    History reviewed. No pertinent surgical history. Family History: History reviewed. No pertinent family history. Family Psychiatric  History: H&P Social History:  Social History   Substance and Sexual Activity  Alcohol Use No     Social History   Substance and Sexual Activity  Drug Use Yes  . Types: Marijuana    Social History   Socioeconomic History  . Marital status: Single    Spouse name: Not on file  . Number of children:  Not on file  . Years of education: Not on file  . Highest education level: Not on file  Occupational History  . Not on file  Tobacco Use  . Smoking status: Never Smoker  . Smokeless tobacco: Never Used  Substance and Sexual Activity  . Alcohol use: No  . Drug use: Yes    Types: Marijuana  . Sexual activity: Never    Birth control/protection: None  Other Topics Concern  . Not on file  Social History Narrative  . Not on file   Social Determinants of Health   Financial Resource Strain: Not on file  Food Insecurity: Not on file  Transportation Needs: Not on file  Physical Activity: Not on file  Stress: Not on file  Social Connections: Not on file   Additional Social History:                         Sleep: Fair  Appetite:  Fair  Current Medications: Current Facility-Administered Medications  Medication Dose Route Frequency Provider Last Rate Last Admin  . acetaminophen (TYLENOL) tablet 650 mg  650 mg Oral Q6H PRN Clapacs, [name], MD   650 mg at 05/06/20 0817  . alum & mag hydroxide-simeth (MAALOX/MYLANTA) 200-200-20 MG/5ML suspension 30 mL  30 mL Oral Q4H PRN Clapacs, [name], MD      . chlorproMAZINE (THORAZINE) injection 25 mg  25 mg Intramuscular TID PRN [name], MD      . haloperidol (HALDOL) tablet 5 mg  5 mg Oral Q8H  PRN [name], MD   5 mg at 04/29/20 1529   And  . LORazepam (ATIVAN) tablet 2 mg  2 mg Oral Q8H PRN [name], MD   2 mg at 04/27/20 1627   And  . diphenhydrAMINE (BENADRYL) capsule 50 mg  50 mg Oral Q8H PRN [name], MD   50 mg at 04/29/20 1529  . feeding supplement (ENSURE ENLIVE / ENSURE PLUS) liquid 237 mL  237 mL Oral BID BM [name], MD   237 mL at 05/05/20 1417  . fluvoxaMINE (LUVOX) tablet 100 mg  100 mg Oral BID Thalia Party, MD      . haloperidol (HALDOL) tablet 10 mg  10 mg Oral QHS Orvin Netter, MD      . hydrOXYzine (ATARAX/VISTARIL) tablet 50 mg  50 mg Oral TID PRN Clapacs, [name], MD   50 mg  at 05/03/20 1607  . LORazepam (ATIVAN) tablet 2 mg  2 mg Oral Q6H PRN [name], MD   2 mg at 05/03/20 1315   Or  . LORazepam (ATIVAN) injection 2 mg  2 mg Intramuscular Q6H PRN [name], MD   2 mg at 04/26/20 1645  . magnesium hydroxide (MILK OF MAGNESIA) suspension 30 mL  30 mL Oral Daily PRN Clapacs, [name], MD      . sodium chloride (OCEAN) 0.65 % nasal spray 2 spray  2 spray Each Nare Q4H PRN [name], MD      . traZODone (DESYREL) tablet 100 mg  100 mg Oral QHS PRN Clapacs, [name], MD   100 mg at 05/04/20 2120    Lab Results: No results found for this or any previous visit (from the past 48 hour(s)).  Blood Alcohol level:  Lab Results  Component Value Date   ETH <10 04/12/2020   ETH <10 03/03/2018    Metabolic Disorder Labs: Lab Results  Component Value Date   HGBA1C 4.7 (L) 04/13/2020   MPG 88.19 04/13/2020   MPG 93.93 03/03/2018   Lab Results  Component Value Date   PROLACTIN 17.5 05/02/2014   Lab Results  Component Value Date   CHOL 122 04/13/2020   TRIG 50 04/13/2020   HDL 48 04/13/2020   CHOLHDL 2.5 04/13/2020   VLDL 10 04/13/2020   LDLCALC 64 04/13/2020   LDLCALC 90 03/03/2018    Physical Findings: AIMS: Facial and Oral Movements Muscles of Facial Expression: None, normal Lips and Perioral Area: None, normal Jaw: None, normal Tongue: None, normal,Extremity Movements Upper (arms, wrists, hands, fingers): None, normal Lower (legs, knees, ankles, toes): None, normal, Trunk Movements Neck, shoulders, hips: None, normal, Overall Severity Severity of abnormal movements (highest score from questions above): None, normal Incapacitation due to abnormal movements: None, normal Patient's awareness of abnormal movements (rate only patient's report): No Awareness, Dental Status Current problems with teeth and/or dentures?: No  CIWA:  CIWA-Ar Total: 0 COWS:  COWS Total Score: 2  Musculoskeletal: Strength & Muscle Tone: within normal  limits Gait & Station: walks on tiptoes Patient leans: N/A  Psychiatric Specialty Exam: Physical Exam   Review of Systems   Blood pressure (!) 98/56, pulse 68, temperature 97.8 F (36.6 C), temperature source Oral, resp. rate 16, height 5\' 11"  (1.803 m), weight 63.5 kg, last menstrual period 03/30/2020, SpO2 98 %.Body mass index is 19.53 kg/m.  General Appearance: Disheveled  Eye Contact:  Fair  Speech:  Slow  Volume:  Decreased  Mood:  Depressed  Affect:  Flat  Thought Process:  Disorganized  Orientation:  Full (Time, Place, and Person)  Thought Content:  Illogical, Delusions, Hallucinations: occasional, not today, Paranoid Ideation and Rumination  Suicidal Thoughts:  No  Homicidal Thoughts:  No  Memory:  Immediate;   Fair Recent;   Fair Remote;   Fair  Judgement:  Impaired  Insight:  limited  Psychomotor Activity:  Restlessness  Concentration:  Concentration: Fair and Attention Span: Fair  Recall:  [employer] of Knowledge:  Fair  Language:  Fair  Akathisia:  No  Handed:  Right  AIMS (if indicated):     Assets:  Desire for Improvement Housing Social Support  ADL's:  impaired  Cognition:  Impaired,  Mild  Sleep:  Number of Hours: 8     Treatment Plan Summary: Daily contact with patient to assess and evaluate symptoms and progress in treatment and Medication management   Patient is a 19 year old female with the above-stated past psychiatric history who is seen in follow-up.  Chart reviewed. Patient discussed with nursing. Patient continues to express psychotic symptoms, depression, anxiety without improvement since last assessment.  Plan: -continue inpatient psych admission; 15-minute checks; daily contact with patient to assess and evaluate symptoms and progress in treatment; psychoeducation. Medications: -discontinue  Risperdal -Increase Haldol to 10 mg QHS for psychosis. -increase Luvox to 100mg  BID for depression, anxiety and OCD. -Continue   Haldol/Ativan/Bendaryl PRN for acute agitation.  -Pertinent Labs: no new labs ordered today -EKG: on 11/19 showed Qtc 12/19 with normal sinus rhythm. EKG reordered today to monitor QTc while on antipsychotics. -Consults: No new consults placed since yesterday  -Disposition: patient is not ready for d/c at this time yet. - I certify that the patient does need, on a daily basis, active treatment furnished directly by or requiring the supervision of inpatient psychiatric facility personnel.   , MD 05/06/2020, 11:23 AM

## 2020-05-06 NOTE — [facility] Group Notes (Signed)
[facility] Group Notes: (Clinical Social Work)   05/06/2020      Type of Therapy:  Group Therapy   Participation Level:  Did Not Attend - was invited individually by Nurse/MHT and chose not to attend.   Susa Simmonds, LCSWA 05/06/2020  2:50 PM

## 2020-05-06 NOTE — Plan of Care (Signed)
Pt is compliant with therapeutic regimen, but is not progressing in areas related to engagement in social activities. Pt is still very anxious and isolates to herself and her room.  Problem: Consults Goal: Concurrent Medical Patient Education Description: (See Patient Education Module for education specifics) Outcome: Progressing   Problem: [facility] Concurrent Medical Problem Goal: LTG-Pt will be physically stable and he/significant other Description: (Patient will be physically stable and he/significant other will be able to verbalize understanding of follow-up care and symptoms that would warrant further treatment) Outcome: Progressing Goal: STG-Vital signs will be within defined limits or stabilized Description: (STG- Vital signs will be within defined limits or stabilized for individual) Outcome: Progressing Goal: STG-Compliance with medication and/or treatment as ordered Description: (STG-Compliance with medication and/or treatment as ordered by MD) Outcome: Progressing Goal: STG-Verbalize two symptoms that would warrant further Description: (STG-Verbalize two symptoms that would warrant further treatment) Outcome: Progressing Goal: STG-Patient will participate in management/stabilization Description: (STG-Patient will participate in management/stabilization of medical condition) Outcome: Progressing Goal: STG-Other (Specify): Description: STG-Other Concurrent Medical (Specify): Outcome: Progressing   Problem: Education: Goal: Knowledge of the prescribed therapeutic regimen will improve Outcome: Progressing   Problem: Health Behavior/Discharge Planning: Goal: Compliance with therapeutic regimen will improve Outcome: Progressing   Problem: Safety: Goal: Ability to disclose and discuss suicidal ideas will improve Outcome: Progressing   Problem: Education: Goal: Knowledge of [facility] Education information/materials will improve Outcome: Progressing Goal: Mental  status will improve Outcome: Progressing Goal: Verbalization of understanding the information provided will improve Outcome: Progressing   Problem: Education: Goal: Utilization of techniques to improve thought processes will improve Outcome: Not Progressing   Problem: Activity: Goal: Interest or engagement in leisure activities will improve Outcome: Not Progressing Goal: Imbalance in normal sleep/wake cycle will improve Outcome: Not Progressing   Problem: Coping: Goal: Coping ability will improve Outcome: Not Progressing Goal: Will verbalize feelings Outcome: Not Progressing   Problem: Role Relationship: Goal: Will demonstrate positive changes in social behaviors and relationships Outcome: Not Progressing   Problem: Self-Concept: Goal: Will verbalize positive feelings about self Outcome: Not Progressing Goal: Level of anxiety will decrease Outcome: Not Progressing   Problem: Education: Goal: Emotional status will improve Outcome: Not Progressing

## 2020-05-07 MED ORDER — BENZTROPINE MESYLATE 1 MG PO TABS: 0.5000 mg | ORAL_TABLET | Freq: Two times a day (BID) | ORAL | Status: DC

## 2020-05-07 MED ORDER — BENZTROPINE MESYLATE 1 MG PO TABS
1.0000 mg | ORAL_TABLET | Freq: Two times a day (BID) | ORAL | Status: DC
  Administered 2020-05-07 – 2020-05-08 (×2): 1 mg via ORAL
  Filled 2020-05-07 (×2): qty 1

## 2020-05-07 MED ORDER — DIPHENHYDRAMINE HCL 25 MG PO CAPS
50.0000 mg | ORAL_CAPSULE | Freq: Once | ORAL | Status: AC
  Administered 2020-05-07: 12:00:00 50 mg via ORAL
  Filled 2020-05-07: qty 2

## 2020-05-07 MED ORDER — BENZTROPINE MESYLATE 1 MG PO TABS
1.0000 mg | ORAL_TABLET | Freq: Once | ORAL | Status: AC
  Administered 2020-05-07: 13:00:00 1 mg via ORAL
  Filled 2020-05-07: qty 1

## 2020-05-07 NOTE — Progress Notes (Signed)
Patient had a dystonic reaction to her medication. PRN medication was given by another nurse on the floor. MD was in patient's room with patient. This Clinical research associate also took PRN Cogentin to patient and stayed in room with MD and patient. This Clinical research associate adjusted head of bed to patient's liking. Patient stated not too long ago that she was feeling better and was going to try to go to sleep. This Clinical research associate notified patient to push/pull the call bell if she has an emergency. Patient verbalized understanding.

## 2020-05-07 NOTE — Progress Notes (Signed)
Recreation Therapy Notes  Date: 05/07/2020  Time: 9:30 am   Location: Craft room     Behavioral response: N/A   Intervention Topic: Necessities   Discussion/Intervention: Patient did not attend group.   Clinical Observations/Feedback:  Patient did not attend group.   Aleaya Latona LRT/CTRS        Nathanial Arrighi 05/07/2020 1:22 PM

## 2020-05-07 NOTE — Progress Notes (Signed)
Patient has been isolative to her room this evening. She is awake lying in bed in calm cooperative mood. She comes to med room when prompted reports being tired and ready for bed.  She received her mediction and tolerated them without incident. She denied SI  HI  AVH and pain at this encounter. She does endorse depression and anxiety and states not being able to go home as the cause. She is safe on the unit at this time and encouraged to come to staff with any concerns.    Cleo Butler-Nicholson, LPN

## 2020-05-07 NOTE — Plan of Care (Signed)
D: Pt denies SI/HI/AVH. Her affect is anxious and depressed and her speech is slow and soft. She is very guarded and interaction is minimal. She rated her depression and anxiety as a 5 today. When asked what was making her anxious, she stated "I miss my family" and "I don't feel very safe". She was reassured that she was safe in the hospital and that staff were making rounds every 15 minutes to ensure safety of all patients on the unit. Her goal for today is to "try not to panic" and she states that she will try to think about other things to help her reach that goal. She reported a 5/10 pain in her head and said that the medication "kind of works".   A: Pt was given medications per MD order. She was given support and encouragement as needed. She was also encouraged to drink additional fluids and her Ensure to prevent hypotension. Her BP in the morning was 76/50 but went up to 100/61 after breakfast.  R: Pt is calm and cooperative with nursing interventions and medications. However, she continues to isolate to herself and her room. Staff will continue to monitor for changes and ensure safety on the unit.  Problem: [facility] Concurrent Medical Problem Goal: STG-Compliance with medication and/or treatment as ordered Description: (STG-Compliance with medication and/or treatment as ordered by MD) Outcome: Progressing Goal: STG-Patient will participate in management/stabilization Description: (STG-Patient will participate in management/stabilization of medical condition) Outcome: Progressing   Problem: Education: Goal: Utilization of techniques to improve thought processes will improve Outcome: Progressing Goal: Knowledge of the prescribed therapeutic regimen will improve Outcome: Progressing   Problem: Activity: Goal: Interest or engagement in leisure activities will improve Outcome: Not Progressing Goal: Imbalance in normal sleep/wake cycle will improve Outcome: Not Progressing

## 2020-05-07 NOTE — Progress Notes (Signed)
[facility] MD Progress Note  05/07/2020 1:46 PM Robin Hess  MRN:  [id number]  Principal Problem: Psychosis (HCC) Diagnosis: Principal Problem:   Psychosis (HCC) Active Problems:   Social anxiety disorder   OCD (obsessive compulsive disorder)  Robin Hess is a  19y.o. female patient who presents to the [facility] unit for treatment of psychosis.  Interval History Patient was seen today for re-evaluation.  Nursing reports no events overnight. Patient has been medication compliant.   Subjective:  Today, patient approaches writer in an acute dystonic reaction with torticollis. She is quite anxious and afraid due to this reaction. Given Benadryl and Ativan while awaiting Cogentin to come from pharmacy. Writer sat with patient until symptoms gradually resolved. During that time patient expressed that she had been very anxious this morning feeling that something terrible would happen to her. She was receptive to explanation of medication side effect, definition, and treatment. She expressed gratitude to sitting with her at bedside until symptoms passed. She was able to open up more about her family, likes, and dislikes. Overall, she seemed less paranoid than on encounter on Friday. She is willing to continue Haldol with cogentin scheduled to prevent further EPS side effects.   Labs: no new results for review.  Total Time spent with patient: 1 hour  Past Psychiatric History: Patient has had 2 prior hospitalizations in 2015 and 2019. Last diagnosis was depression and OCD. Did not follow-up with outpatient treatment. Not currently getting any outpatient treatment. Has made suicidal statements in the past. Recently had not been trying to hurt her self or showing any violence to others. Some history of cannabis use in the past but currently does not seem to be an active issue.   Past Medical History:  Past Medical History:  Diagnosis Date  . Allergy   . Anxiety   . Cannabis use disorder, moderate,  dependence (HCC)   . Depression   . OCD (obsessive compulsive disorder)   . Severe major depression, single episode, with psychotic features (HCC)   . Social anxiety disorder    History reviewed. No pertinent surgical history. Family History: History reviewed. No pertinent family history. Family Psychiatric  History: None reported Social History:  Social History   Substance and Sexual Activity  Alcohol Use No     Social History   Substance and Sexual Activity  Drug Use Yes  . Types: Marijuana    Social History   Socioeconomic History  . Marital status: Single    Spouse name: Not on file  . Number of children: Not on file  . Years of education: Not on file  . Highest education level: Not on file  Occupational History  . Not on file  Tobacco Use  . Smoking status: Never Smoker  . Smokeless tobacco: Never Used  Substance and Sexual Activity  . Alcohol use: No  . Drug use: Yes    Types: Marijuana  . Sexual activity: Never    Birth control/protection: None  Other Topics Concern  . Not on file  Social History Narrative  . Not on file   Social Determinants of Health   Financial Resource Strain: Not on file  Food Insecurity: Not on file  Transportation Needs: Not on file  Physical Activity: Not on file  Stress: Not on file  Social Connections: Not on file   Additional Social History:                         Sleep: Fair  Appetite:  Fair  Current Medications: Current Facility-Administered Medications  Medication Dose Route Frequency Provider Last Rate Last Admin  . acetaminophen (TYLENOL) tablet 650 mg  650 mg Oral Q6H PRN Clapacs, Jackquline DenmarkJohn T, MD   650 mg at 05/07/20 0810  . alum & mag hydroxide-simeth (MAALOX/MYLANTA) 200-200-20 MG/5ML suspension 30 mL  30 mL Oral Q4H PRN Clapacs, [name], MD      . benztropine (COGENTIN) tablet 1 mg  1 mg Oral BID Jesse SansFreeman, Boniface Goffe M, MD      . chlorproMAZINE (THORAZINE) injection 25 mg  25 mg Intramuscular TID PRN Jesse SansFreeman,  Lucilla Petrenko M, MD      . haloperidol (HALDOL) tablet 5 mg  5 mg Oral Q8H PRN Jesse SansFreeman, Guiseppe Flanagan M, MD   5 mg at 04/29/20 1529   And  . LORazepam (ATIVAN) tablet 2 mg  2 mg Oral Q8H PRN Jesse SansFreeman, Nasra Counce M, MD   2 mg at 04/27/20 1627   And  . diphenhydrAMINE (BENADRYL) capsule 50 mg  50 mg Oral Q8H PRN Jesse SansFreeman, Emmerson Taddei M, MD   50 mg at 04/29/20 1529  . feeding supplement (ENSURE ENLIVE / ENSURE PLUS) liquid 237 mL  237 mL Oral BID BM Jesse SansFreeman, Gabriele Loveland M, MD   237 mL at 05/07/20 1039  . fluvoxaMINE (LUVOX) tablet 100 mg  100 mg Oral BID Thalia PartyPaliy, Alisa, MD   100 mg at 05/07/20 0809  . haloperidol (HALDOL) tablet 10 mg  10 mg Oral QHS Paliy, Serina CowperAlisa, MD   10 mg at 05/06/20 2124  . hydrOXYzine (ATARAX/VISTARIL) tablet 50 mg  50 mg Oral TID PRN Clapacs, Jackquline DenmarkJohn T, MD   50 mg at 05/03/20 [id number]  . LORazepam (ATIVAN) tablet 2 mg  2 mg Oral Q6H PRN Jesse SansFreeman, [place] M, MD   2 mg at 05/07/20 1208   Or  . LORazepam (ATIVAN) injection 2 mg  2 mg Intramuscular Q6H PRN Jesse SansFreeman, Nidhi Jacome M, MD   2 mg at 04/26/20 1645  . magnesium hydroxide (MILK OF MAGNESIA) suspension 30 mL  30 mL Oral Daily PRN Clapacs, [name], MD      . sodium chloride (OCEAN) 0.65 % nasal spray 2 spray  2 spray Each Nare Q4H PRN Jesse SansFreeman, Cendy Oconnor M, MD      . traZODone (DESYREL) tablet 100 mg  100 mg Oral QHS PRN Clapacs, Jackquline DenmarkJohn T, MD   100 mg at 05/06/20 2125    Lab Results: No results found for this or any previous visit (from the past 48 hour(s)).  Blood Alcohol level:  Lab Results  Component Value Date   ETH <10 04/12/2020   ETH <10 03/03/2018    Metabolic Disorder Labs: Lab Results  Component Value Date   HGBA1C 4.7 (L) 04/13/2020   MPG 88.19 04/13/2020   MPG 93.93 03/03/2018   Lab Results  Component Value Date   PROLACTIN 17.5 05/02/2014   Lab Results  Component Value Date   CHOL 122 04/13/2020   TRIG 50 04/13/2020   HDL 48 04/13/2020   CHOLHDL 2.5 04/13/2020   VLDL 10 04/13/2020   LDLCALC 64 04/13/2020   LDLCALC 90 03/03/2018    Physical  Findings: AIMS: Facial and Oral Movements Muscles of Facial Expression: None, normal Lips and Perioral Area: None, normal Jaw: None, normal Tongue: None, normal,Extremity Movements Upper (arms, wrists, hands, fingers): None, normal Lower (legs, knees, ankles, toes): None, normal, Trunk Movements Neck, shoulders, hips: None, normal, Overall Severity Severity of abnormal movements (highest score from questions above): None, normal Incapacitation  due to abnormal movements: None, normal Patient's awareness of abnormal movements (rate only patient's report): No Awareness, Dental Status Current problems with teeth and/or dentures?: No  CIWA:  CIWA-Ar Total: 0 COWS:  COWS Total Score: 2  Musculoskeletal: Strength & Muscle Tone: within normal limits Gait & Station: walks on tiptoes Patient leans: N/A  Psychiatric Specialty Exam: Physical Exam Vitals and nursing note reviewed.  Constitutional:      General: She is in acute distress.  HENT:     Head: Normocephalic and atraumatic.     Right Ear: External ear normal.     Left Ear: External ear normal.     Nose: Nose normal.     Mouth/Throat:     Mouth: Mucous membranes are moist.     Pharynx: Oropharynx is clear.  Eyes:     Extraocular Movements: Extraocular movements intact.     Conjunctiva/sclera: Conjunctivae normal.     Pupils: Pupils are equal, round, and reactive to light.  Cardiovascular:     Rate and Rhythm: Normal rate.     Pulses: Normal pulses.  Pulmonary:     Effort: Pulmonary effort is normal.     Breath sounds: Normal breath sounds.  Abdominal:     General: Abdomen is flat.     Palpations: Abdomen is soft.  Musculoskeletal:        General: Tenderness present. No swelling.     Cervical back: Rigidity and tenderness present.  Skin:    General: Skin is warm and dry.  Neurological:     General: No focal deficit present.     Mental Status: She is alert and oriented to person, place, and time.  Psychiatric:         Attention and Perception: She perceives auditory hallucinations.        Mood and Affect: Mood is anxious. Affect is tearful.        Speech: Speech normal.        Behavior: Behavior is cooperative.        Thought Content: Thought content is paranoid and delusional.        Cognition and Memory: Cognition and memory normal.        Judgment: Judgment is impulsive.     Review of Systems  Constitutional: Positive for fatigue. Negative for appetite change.  HENT: Negative for rhinorrhea and sore throat.   Eyes: Negative for photophobia and visual disturbance.  Respiratory: Negative for cough and shortness of breath.   Cardiovascular: Negative for chest pain and palpitations.  Gastrointestinal: Negative for diarrhea, nausea and vomiting.  Endocrine: Negative for cold intolerance and heat intolerance.  Genitourinary: Negative for difficulty urinating and dysuria.  Musculoskeletal: Positive for arthralgias, myalgias, neck pain and neck stiffness.  Skin: Negative for rash and wound.  Allergic/Immunologic: Negative for food allergies and immunocompromised state.  Neurological: Positive for tremors and headaches.  Hematological: Negative for adenopathy.  Psychiatric/Behavioral: Positive for hallucinations. Negative for suicidal ideas. The patient is nervous/anxious.     Blood pressure 100/61, pulse 69, temperature 98.3 F (36.8 C), temperature source Oral, resp. rate 14, height 5\' 11"  (1.803 m), weight 63.5 kg, last menstrual period 03/30/2020, SpO2 97 %.Body mass index is 19.53 kg/m.  General Appearance: Disheveled  Eye Contact:  Fair  Speech:  Slow  Volume:  Decreased  Mood:  Anxious  Affect:  Tearful  Thought Process:  Disorganized  Orientation:  Full (Time, Place, and Person)  Thought Content:  Illogical, Delusions, Hallucinations: occasional, not today, Paranoid Ideation and Rumination  Suicidal Thoughts:  No  Homicidal Thoughts:  No  Memory:  Immediate;   Fair Recent;   Fair Remote;    Fair  Judgement:  Impaired  Insight:  limited  Psychomotor Activity:  Restlessness and acute dystonia   Concentration:  Concentration: Fair and Attention Span: Fair  Recall:  [employer] of Knowledge:  Fair  Language:  Fair  Akathisia:  No  Handed:  Right  AIMS (if indicated):     Assets:  Desire for Improvement Housing Social Support  ADL's:  impaired  Cognition:  Impaired,  Mild  Sleep:  Number of Hours: 9     Treatment Plan Summary: Daily contact with patient to assess and evaluate symptoms and progress in treatment and Medication management   Patient is a 19 year old female with the above-stated past psychiatric history who is seen in follow-up.  Chart reviewed. Patient discussed with nursing. Patient continues to express psychotic symptoms, depression, anxiety without improvement since last assessment. Symptoms did not improve with Seroquel up to 450 mg daily, or with Risperdal up to 4 mg daily. Both have been tapered and discontinued  Plan: -continue inpatient psych admission; 15-minute checks; daily contact with patient to assess and evaluate symptoms and progress in treatment; psychoeducation. Medications: -Continue Haldol to 10 mg QHS for psychosis, add cogentin 1 mg BID for EPS. -Continue Luvox to 100mg  BID for depression, anxiety and OCD. -Continue  Haldol/Ativan/Bendaryl PRN for acute agitation.  -Pertinent Labs: no new labs ordered today -EKG: on 12/12 QTc 428 -Consults: No new consults placed since yesterday  -Disposition: patient is not ready for d/c at this time yet. - I certify that the patient does need, on a daily basis, active treatment furnished directly by or requiring the supervision of inpatient psychiatric facility personnel.   14/12, MD 05/07/2020, 1:46 PM

## 2020-05-07 NOTE — Plan of Care (Signed)
  Problem: Consults Goal: Concurrent Medical Patient Education Description: (See Patient Education Module for education specifics) Outcome: Progressing   Problem: [facility] Concurrent Medical Problem Goal: LTG-Pt will be physically stable and he/significant other Description: (Patient will be physically stable and he/significant other will be able to verbalize understanding of follow-up care and symptoms that would warrant further treatment) Outcome: Progressing Goal: STG-Vital signs will be within defined limits or stabilized Description: (STG- Vital signs will be within defined limits or stabilized for individual) Outcome: Progressing Goal: STG-Compliance with medication and/or treatment as ordered Description: (STG-Compliance with medication and/or treatment as ordered by MD) Outcome: Progressing Goal: STG-Verbalize two symptoms that would warrant further Description: (STG-Verbalize two symptoms that would warrant further treatment) Outcome: Progressing Goal: STG-Patient will participate in management/stabilization Description: (STG-Patient will participate in management/stabilization of medical condition) Outcome: Progressing Goal: STG-Other (Specify): Description: STG-Other Concurrent Medical (Specify): Outcome: Progressing   Problem: Education: Goal: Utilization of techniques to improve thought processes will improve Outcome: Progressing Goal: Knowledge of the prescribed therapeutic regimen will improve Outcome: Progressing   Problem: Activity: Goal: Interest or engagement in leisure activities will improve Outcome: Progressing Goal: Imbalance in normal sleep/wake cycle will improve Outcome: Progressing   Problem: Coping: Goal: Coping ability will improve Outcome: Progressing Goal: Will verbalize feelings Outcome: Progressing   Problem: Health Behavior/Discharge Planning: Goal: Ability to make decisions will improve Outcome: Progressing Goal: Compliance with therapeutic  regimen will improve Outcome: Progressing   Problem: Role Relationship: Goal: Will demonstrate positive changes in social behaviors and relationships Outcome: Progressing   Problem: Safety: Goal: Ability to disclose and discuss suicidal ideas will improve Outcome: Progressing Goal: Ability to identify and utilize support systems that promote safety will improve Outcome: Progressing   Problem: Self-Concept: Goal: Will verbalize positive feelings about self Outcome: Progressing Goal: Level of anxiety will decrease Outcome: Progressing   Problem: Education: Goal: Knowledge of [place] [facility] Education information/materials will improve Outcome: Progressing Goal: Emotional status will improve Outcome: Progressing Goal: Mental status will improve Outcome: Progressing Goal: Verbalization of understanding the information provided will improve Outcome: Progressing   

## 2020-05-07 NOTE — [facility] Group Notes (Signed)
LCSW Group Therapy Note   05/07/2020 2:23 PM  Type of Therapy and Topic:  Group Therapy:  Overcoming Obstacles   Participation Level:  Did Not Attend   Description of Group:    In this group patients will be encouraged to explore what they see as obstacles to their own wellness and recovery. They will be guided to discuss their thoughts, feelings, and behaviors related to these obstacles. The group will process together ways to cope with barriers, with attention given to specific choices patients can make. Each patient will be challenged to identify changes they are motivated to make in order to overcome their obstacles. This group will be process-oriented, with patients participating in exploration of their own experiences as well as giving and receiving support and challenge from other group members.   Therapeutic Goals: 1. Patient will identify personal and current obstacles as they relate to admission. 2. Patient will identify barriers that currently interfere with their wellness or overcoming obstacles.  3. Patient will identify feelings, thought process and behaviors related to these barriers. 4. Patient will identify two changes they are willing to make to overcome these obstacles:      Summary of Patient Progress X    Therapeutic Modalities:   Cognitive Behavioral Therapy Solution Focused Therapy Motivational Interviewing Relapse Prevention Therapy  Eloisa Chokshi R. Pheonix Clinkscale, MSW, LCSW, LCAS 05/07/2020 2:23 PM    

## 2020-05-07 NOTE — Progress Notes (Signed)
Patient has been isolative to her room this evening.  She is pleasant and cooperative. She reports feeling better from earlier reaction to her medication.  She denies SI  HI  AVH at this encounter. She endorses mild headache but does not want any medication to treat. She is medication compliant with prescribed meds and tolerates her medication without incident. She is safe on the unit with 25 minute safety checks and encouraged to come to staff with any concerns.     Cleo Butler-Nicholson, LPN

## 2020-05-07 NOTE — Plan of Care (Signed)
  Problem: Consults Goal: Concurrent Medical Patient Education Description: (See Patient Education Module for education specifics) Outcome: Progressing   Problem: [facility] Concurrent Medical Problem Goal: LTG-Pt will be physically stable and he/significant other Description: (Patient will be physically stable and he/significant other will be able to verbalize understanding of follow-up care and symptoms that would warrant further treatment) Outcome: Progressing Goal: STG-Vital signs will be within defined limits or stabilized Description: (STG- Vital signs will be within defined limits or stabilized for individual) Outcome: Progressing Goal: STG-Compliance with medication and/or treatment as ordered Description: (STG-Compliance with medication and/or treatment as ordered by MD) Outcome: Progressing Goal: STG-Verbalize two symptoms that would warrant further Description: (STG-Verbalize two symptoms that would warrant further treatment) Outcome: Progressing Goal: STG-Patient will participate in management/stabilization Description: (STG-Patient will participate in management/stabilization of medical condition) Outcome: Progressing Goal: STG-Other (Specify): Description: STG-Other Concurrent Medical (Specify): Outcome: Progressing   Problem: Education: Goal: Utilization of techniques to improve thought processes will improve Outcome: Progressing Goal: Knowledge of the prescribed therapeutic regimen will improve Outcome: Progressing   Problem: Activity: Goal: Interest or engagement in leisure activities will improve Outcome: Progressing Goal: Imbalance in normal sleep/wake cycle will improve Outcome: Progressing   Problem: Coping: Goal: Coping ability will improve Outcome: Progressing Goal: Will verbalize feelings Outcome: Progressing   Problem: Health Behavior/Discharge Planning: Goal: Ability to make decisions will improve Outcome: Progressing Goal: Compliance with therapeutic  regimen will improve Outcome: Progressing   Problem: Role Relationship: Goal: Will demonstrate positive changes in social behaviors and relationships Outcome: Progressing   Problem: Safety: Goal: Ability to disclose and discuss suicidal ideas will improve Outcome: Progressing Goal: Ability to identify and utilize support systems that promote safety will improve Outcome: Progressing   Problem: Self-Concept: Goal: Will verbalize positive feelings about self Outcome: Progressing Goal: Level of anxiety will decrease Outcome: Progressing   Problem: Education: Goal: Knowledge of [place] [facility] Education information/materials will improve Outcome: Progressing Goal: Emotional status will improve Outcome: Progressing Goal: Mental status will improve Outcome: Progressing Goal: Verbalization of understanding the information provided will improve Outcome: Progressing

## 2020-05-08 MED ORDER — BENZTROPINE MESYLATE 1 MG PO TABS
2.0000 mg | ORAL_TABLET | Freq: Two times a day (BID) | ORAL | Status: DC
  Administered 2020-05-08 – 2020-05-09 (×2): 2 mg via ORAL
  Filled 2020-05-08 (×2): qty 2

## 2020-05-08 NOTE — Tx Team (Signed)
Interdisciplinary Treatment and Diagnostic Plan Update  05/08/2020 Time of Session: 8:30 AM  Robin Hess MRN: [id number]  Principal Diagnosis: Psychosis [facility])  Secondary Diagnoses: Principal Problem:   Psychosis (HCC) Active Problems:   Social anxiety disorder   OCD (obsessive compulsive disorder)   Current Medications:  Current Facility-Administered Medications  Medication Dose Route Frequency Provider Last Rate Last Admin  . acetaminophen (TYLENOL) tablet 650 mg  650 mg Oral Q6H PRN Clapacs, [name], MD   650 mg at 05/07/20 0810  . alum & mag hydroxide-simeth (MAALOX/MYLANTA) 200-200-20 MG/5ML suspension 30 mL  30 mL Oral Q4H PRN Clapacs, [name], MD      . benztropine (COGENTIN) tablet 1 mg  1 mg Oral BID [name], MD   1 mg at 05/08/20 0818  . chlorproMAZINE (THORAZINE) injection 25 mg  25 mg Intramuscular TID PRN [name], MD      . haloperidol (HALDOL) tablet 5 mg  5 mg Oral Q8H PRN [name], MD   5 mg at 04/29/20 1529   And  . LORazepam (ATIVAN) tablet 2 mg  2 mg Oral Q8H PRN [name], MD   2 mg at 04/27/20 1627   And  . diphenhydrAMINE (BENADRYL) capsule 50 mg  50 mg Oral Q8H PRN [name], MD   50 mg at 04/29/20 1529  . feeding supplement (ENSURE ENLIVE / ENSURE PLUS) liquid 237 mL  237 mL Oral BID BM [name], MD   237 mL at 05/07/20 1433  . fluvoxaMINE (LUVOX) tablet 100 mg  100 mg Oral BID Thalia Party, MD   100 mg at 05/08/20 0818  . haloperidol (HALDOL) tablet 10 mg  10 mg Oral QHS Paliy, Serina Cowper, MD   10 mg at 05/07/20 2117  . hydrOXYzine (ATARAX/VISTARIL) tablet 50 mg  50 mg Oral TID PRN Clapacs, [name], MD   50 mg at 05/03/20 6283  . LORazepam (ATIVAN) tablet 2 mg  2 mg Oral Q6H PRN [name], MD   2 mg at 05/07/20 1208   Or  . LORazepam (ATIVAN) injection 2 mg  2 mg Intramuscular Q6H PRN [name], MD   2 mg at 04/26/20 1645  . magnesium hydroxide (MILK OF MAGNESIA) suspension 30 mL  30 mL Oral Daily PRN  Clapacs, [name], MD      . sodium chloride (OCEAN) 0.65 % nasal spray 2 spray  2 spray Each Nare Q4H PRN [name], MD      . traZODone (DESYREL) tablet 100 mg  100 mg Oral QHS PRN Clapacs, [name], MD   100 mg at 05/07/20 2117   PTA Medications: Medications Prior to Admission  Medication Sig Dispense Refill Last Dose  . fluvoxaMINE (LUVOX) 100 MG tablet Take 1 tablet (100 mg total) by mouth at bedtime. (Patient not taking: Reported on 04/12/2020) 30 tablet 1   . hydrOXYzine (ATARAX/VISTARIL) 50 MG tablet Take 1 tablet (50 mg total) by mouth 3 (three) times daily as needed for anxiety. (Patient not taking: Reported on 04/12/2020) 90 tablet 1   . lamoTRIgine (LAMICTAL) 25 MG tablet Take 1 tablet (25 mg total) by mouth at bedtime. Take 1 tab for two weeks, 2 tabs for 2 weeks, 4 tabs for 2 weeks, 8 tabs or 200 mg after (Patient not taking: Reported on 04/12/2020) 45 tablet 1   . prazosin (MINIPRESS) 1 MG capsule Take 1 capsule (1 mg total) by mouth 2 (two) times  daily. (Patient not taking: Reported on 04/12/2020) 60 capsule 1   . traZODone (DESYREL) 100 MG tablet Take 1 tablet (100 mg total) by mouth at bedtime as needed for sleep. (Patient not taking: Reported on 04/12/2020) 30 tablet 1     Patient Stressors:    Patient Strengths:    Treatment Modalities: Medication Management, Group therapy, Case management,  1 to 1 session with clinician, Psychoeducation, Recreational therapy.   Physician Treatment Plan for Primary Diagnosis: Psychosis (HCC) Long Term Goal(s): Improvement in symptoms so as ready for discharge Improvement in symptoms so as ready for discharge   Short Term Goals: Ability to identify changes in lifestyle to reduce recurrence of condition will improve Ability to verbalize feelings will improve Ability to disclose and discuss suicidal ideas Ability to demonstrate self-control will improve Ability to identify and develop effective coping behaviors will  improve Compliance with prescribed medications will improve Ability to identify changes in lifestyle to reduce recurrence of condition will improve Ability to verbalize feelings will improve Ability to disclose and discuss suicidal ideas Ability to demonstrate self-control will improve Ability to identify and develop effective coping behaviors will improve Compliance with prescribed medications will improve  Medication Management: Evaluate patient's response, side effects, and tolerance of medication regimen.  Therapeutic Interventions: 1 to 1 sessions, Unit Group sessions and Medication administration.  Evaluation of Outcomes: Not Progressing  Physician Treatment Plan for Secondary Diagnosis: Principal Problem:   Psychosis (HCC) Active Problems:   Social anxiety disorder   OCD (obsessive compulsive disorder)  Long Term Goal(s): Improvement in symptoms so as ready for discharge Improvement in symptoms so as ready for discharge   Short Term Goals: Ability to identify changes in lifestyle to reduce recurrence of condition will improve Ability to verbalize feelings will improve Ability to disclose and discuss suicidal ideas Ability to demonstrate self-control will improve Ability to identify and develop effective coping behaviors will improve Compliance with prescribed medications will improve Ability to identify changes in lifestyle to reduce recurrence of condition will improve Ability to verbalize feelings will improve Ability to disclose and discuss suicidal ideas Ability to demonstrate self-control will improve Ability to identify and develop effective coping behaviors will improve Compliance with prescribed medications will improve     Medication Management: Evaluate patient's response, side effects, and tolerance of medication regimen.  Therapeutic Interventions: 1 to 1 sessions, Unit Group sessions and Medication administration.  Evaluation of Outcomes: Not  Progressing   RN Treatment Plan for Primary Diagnosis: Psychosis (HCC) Long Term Goal(s): Knowledge of disease and therapeutic regimen to maintain health will improve  Short Term Goals: Ability to verbalize frustration and anger appropriately will improve, Ability to participate in decision making will improve, Ability to verbalize feelings will improve, Ability to identify and develop effective coping behaviors will improve and Compliance with prescribed medications will improve  Medication Management: RN will administer medications as ordered by provider, will assess and evaluate patient's response and provide education to patient for prescribed medication. RN will report any adverse and/or side effects to prescribing provider.  Therapeutic Interventions: 1 on 1 counseling sessions, Psychoeducation, Medication administration, Evaluate responses to treatment, Monitor vital signs and CBGs as ordered, Perform/monitor CIWA, COWS, AIMS and Fall Risk screenings as ordered, Perform wound care treatments as ordered.  Evaluation of Outcomes: Not Progressing   LCSW Treatment Plan for Primary Diagnosis: Psychosis (HCC) Long Term Goal(s): Safe transition to appropriate next level of care at discharge, Engage patient in therapeutic group addressing interpersonal concerns.  Short  Term Goals: Engage patient in aftercare planning with referrals and resources, Increase social support, Increase ability to appropriately verbalize feelings, Facilitate acceptance of mental health diagnosis and concerns, Identify triggers associated with mental health/substance abuse issues and Increase skills for wellness and recovery  Therapeutic Interventions: Assess for all discharge needs, 1 to 1 time with Social worker, Explore available resources and support systems, Assess for adequacy in community support network, Educate family and significant other(s) on suicide prevention, Complete Psychosocial Assessment, Interpersonal  group therapy.  Evaluation of Outcomes: Not Progressing   Progress in Treatment: Attending groups: No. Patient has attended some groups. Participating in groups: No. Usually just sits in the group with little participation unless called on to speak. Taking medication as prescribed: Yes. Toleration medication: Yes. Family/Significant other contact made: Yes, individual(s) contacted:  Ernestina Patches, Father  Patient understands diagnosis: No. Discussing patient identified problems/goals with staff: No. Medical problems stabilized or resolved: Yes. Denies suicidal/homicidal ideation: Yes. Issues/concerns per patient self-inventory: No. Other: None  New problem(s) identified: No, Describe:  None   New Short Term/Long Term Goal(s): Elimination of symptoms of psychosis, medication management for mood stabilization; elimination of SI thoughts; development of comprehensive mental wellness/sobriety plan. Update (04/18/20) Patient was able to have brief (30 minutes) conversation with physician. Update 04/23/20: No changes at this time. Update 04/28/2020: No changes at this time Update 05/03/20: No changes at this time. Update 05/08/20: No changes at this time.  Patient Goals:  "Work on myself." "Update (04/18/20) nothing significant to report. Update 04/23/20: No changes at this time. Update 04/28/20: No changes at this time Update 05/03/20: No changes at this time. Update 05/08/20: No changes at this time.   Discharge Plan or Barriers:  CSW will assist with aftercare planning as needed. Update 04/23/20: No changes at this time. Update 04/28/2020: No changes at this time Update 05/03/20: No changes at this time. Update 05/08/20: No changes at this time.  Reason for Continuation of Hospitalization: Anxiety Delusions  Depression Hallucinations Medication stabilization  Estimated Length of Stay: TBD   Attendees: Patient: 05/08/2020 9:00 AM  Physician: Les Pou, MD 05/08/2020 9:00 AM  Nursing:   05/08/2020 9:00 AM  RN Care Manager: 05/08/2020 9:00 AM  Social Worker: Vilma Meckel. Algis Greenhouse, MSW, LCSW, LCAS 05/08/2020 9:00 AM  Recreational Therapist:  05/08/2020 9:00 AM  Other: Penni Homans, MSW, LCSW 05/08/2020 9:00 AM  Other: Gwenevere Ghazi, MSW, LCSWA, LCASA 05/08/2020 9:00 AM  Other: 05/08/2020 9:00 AM    Scribe for Treatment Team: Glenis Smoker, LCSW 05/08/2020 9:00 AM

## 2020-05-08 NOTE — [facility] Group Notes (Signed)
LCSW Group Therapy Note  05/08/2020 2:09 PM  Type of Therapy/Topic:  Group Therapy:  Feelings about Diagnosis  Participation Level:  Did Not Attend   Description of Group:   This group will allow patients to explore their thoughts and feelings about diagnoses they have received. Patients will be guided to explore their level of understanding and acceptance of these diagnoses. Facilitator will encourage patients to process their thoughts and feelings about the reactions of others to their diagnosis and will guide patients in identifying ways to discuss their diagnosis with significant others in their lives. This group will be process-oriented, with patients participating in exploration of their own experiences, giving and receiving support, and processing challenge from other group members.   Therapeutic Goals: 1. Patient will demonstrate understanding of diagnosis as evidenced by identifying two or more symptoms of the disorder 2. Patient will be able to express two feelings regarding the diagnosis 3. Patient will demonstrate their ability to communicate their needs through discussion and/or role play  Summary of Patient Progress: X  Therapeutic Modalities:   Cognitive Behavioral Therapy Brief Therapy Feelings Identification   Shaynah Hund R. Algis Greenhouse, MSW, LCSW, LCAS 05/08/2020 2:09 PM

## 2020-05-08 NOTE — Progress Notes (Signed)
[facility]  05/08/2020 9:28 AM Robin Hess  MRN:  [id number]  Principal Problem: Psychosis (HCC) Diagnosis: Principal Problem:   Psychosis (HCC) Active Problems:   Social anxiety disorder   OCD (obsessive compulsive disorder)  Robin Hess is a  19y.o. female patient [facility]  Interval History Patient was seen today for re-evaluation.  Nursing reports no events overnight. Patient has been medication compliant.   Subjective:  Today, patient assessed one-on-one at bedside today. She continues to have slight tremor today, but no acute dystonia noted. She states she is feeling better today. She is still apprehensive and guarded on exam, and states "I don't know" when asked if she is still hearing voices, feeling her parents or dead, or if someone is trying to hurt her. Today she denies suicidal ideations, homicidal ideations, or visual hallucinations. Will increase Cogentin to 2 mg BID for ongoing EPS side effects today.    Labs: no new results for review.  Total Time spent with patient: 30 minutes  Past Psychiatric History: Patient has had 2 prior hospitalizations in 2015 and 2019. Last diagnosis was depression and OCD. Did not follow-up with outpatient treatment. Not currently getting any outpatient treatment. Has made suicidal statements in the past. Recently had not been trying to hurt her self or showing any violence to others. Some history of cannabis use in the past but currently does not seem to be an active issue.   Past Medical History:  Past Medical History:  Diagnosis Date  . Allergy   . Anxiety   . Cannabis use disorder, moderate, dependence (HCC)   . Depression   . OCD (obsessive compulsive disorder)   . Severe major depression, single episode, with psychotic features (HCC)   . Social anxiety disorder    History reviewed. No pertinent surgical history. Family History: History reviewed. No pertinent family  history. Family Psychiatric  History: None reported Social History:  Social History   Substance and Sexual Activity  Alcohol Use No     Social History   Substance and Sexual Activity  Drug Use Yes  . Types: Marijuana    Social History   Socioeconomic History  . Marital status: Single    Spouse name: Not on file  . Number of children: Not on file  . Years of education: Not on file  . Highest education level: Not on file  Occupational History  . Not on file  Tobacco Use  . Smoking status: Never Smoker  . Smokeless tobacco: Never Used  Substance and Sexual Activity  . Alcohol use: No  . Drug use: Yes    Types: Marijuana  . Sexual activity: Never    Birth control/protection: None  Other Topics Concern  . Not on file  Social History Narrative  . Not on file   Social Determinants of Health   Financial Resource Strain: Not on file  Food Insecurity: Not on file  Transportation Needs: Not on file  Physical Activity: Not on file  Stress: Not on file  Social Connections: Not on file   Additional Social History:                         Sleep: Fair  Appetite:  Fair  Current Medications: Current Facility-Administered Medications  Medication Dose Route Frequency Provider Last Rate Last Admin  . acetaminophen (TYLENOL) tablet 650 mg  650 mg Oral Q6H PRN Clapacs, Jackquline DenmarkJohn T, MD  650 mg at 05/07/20 0810  . alum & mag hydroxide-simeth (MAALOX/MYLANTA) 200-200-20 MG/5ML suspension 30 mL  30 mL Oral Q4H PRN Clapacs, [name], MD      . benztropine (COGENTIN) tablet 2 mg  2 mg Oral BID [name], MD      . chlorproMAZINE (THORAZINE) injection 25 mg  25 mg Intramuscular TID PRN [name], MD      . haloperidol (HALDOL) tablet 5 mg  5 mg Oral Q8H PRN [name], MD   5 mg at 04/29/20 1529   And  . LORazepam (ATIVAN) tablet 2 mg  2 mg Oral Q8H PRN [name], MD   2 mg at 04/27/20 1627   And  . diphenhydrAMINE (BENADRYL) capsule 50 mg  50 mg Oral  Q8H PRN [name], MD   50 mg at 04/29/20 1529  . feeding supplement (ENSURE ENLIVE / ENSURE PLUS) liquid 237 mL  237 mL Oral BID BM [name], MD   237 mL at 05/07/20 1433  . fluvoxaMINE (LUVOX) tablet 100 mg  100 mg Oral BID Thalia Party, MD   100 mg at 05/08/20 0818  . haloperidol (HALDOL) tablet 10 mg  10 mg Oral QHS Paliy, Serina Cowper, MD   10 mg at 05/07/20 2117  . hydrOXYzine (ATARAX/VISTARIL) tablet 50 mg  50 mg Oral TID PRN Clapacs, [name], MD   50 mg at 05/03/20 1610  . LORazepam (ATIVAN) tablet 2 mg  2 mg Oral Q6H PRN [name], MD   2 mg at 05/07/20 1208   Or  . LORazepam (ATIVAN) injection 2 mg  2 mg Intramuscular Q6H PRN [name], MD   2 mg at 04/26/20 1645  . magnesium hydroxide (MILK OF MAGNESIA) suspension 30 mL  30 mL Oral Daily PRN Clapacs, [name], MD      . sodium chloride (OCEAN) 0.65 % nasal spray 2 spray  2 spray Each Nare Q4H PRN [name], MD      . traZODone (DESYREL) tablet 100 mg  100 mg Oral QHS PRN Clapacs, [name], MD   100 mg at 05/07/20 2117    Lab Results: No results found for this or any previous visit (from the past 48 hour(s)).  Blood Alcohol level:  Lab Results  Component Value Date   ETH <10 04/12/2020   ETH <10 03/03/2018    Metabolic Disorder Labs: Lab Results  Component Value Date   HGBA1C 4.7 (L) 04/13/2020   MPG 88.19 04/13/2020   MPG 93.93 03/03/2018   Lab Results  Component Value Date   PROLACTIN 17.5 05/02/2014   Lab Results  Component Value Date   CHOL 122 04/13/2020   TRIG 50 04/13/2020   HDL 48 04/13/2020   CHOLHDL 2.5 04/13/2020   VLDL 10 04/13/2020   LDLCALC 64 04/13/2020   LDLCALC 90 03/03/2018    Physical Findings: AIMS: Facial and Oral Movements Muscles of Facial Expression: None, normal Lips and Perioral Area: None, normal Jaw: None, normal Tongue: None, normal,Extremity Movements Upper (arms, wrists, hands, fingers): None, normal Lower (legs, knees, ankles, toes): None, normal,  Trunk Movements Neck, shoulders, hips: None, normal, Overall Severity Severity of abnormal movements (highest score from questions above): None, normal Incapacitation due to abnormal movements: None, normal Patient's awareness of abnormal movements (rate only patient's report): No Awareness, Dental Status Current problems with teeth and/or dentures?: No  CIWA:  CIWA-Ar Total: 0 COWS:  COWS Total Score: 2  Musculoskeletal: Strength & Muscle Tone: within normal limits Gait & Station: walks on tiptoes Patient leans: N/A  Psychiatric Specialty Exam: Physical Exam Vitals and nursing note reviewed.  Constitutional:      Appearance: Normal appearance.  HENT:     Head: Normocephalic and atraumatic.     Right Ear: External ear normal.     Left Ear: External ear normal.     Nose: Nose normal.     Mouth/Throat:     Mouth: Mucous membranes are moist.     Pharynx: Oropharynx is clear.  Eyes:     Extraocular Movements: Extraocular movements intact.     Conjunctiva/sclera: Conjunctivae normal.     Pupils: Pupils are equal, round, and reactive to light.  Cardiovascular:     Rate and Rhythm: Normal rate.     Pulses: Normal pulses.  Pulmonary:     Effort: Pulmonary effort is normal.     Breath sounds: Normal breath sounds.  Abdominal:     General: Abdomen is flat.     Palpations: Abdomen is soft.  Musculoskeletal:        General: No swelling or tenderness. Normal range of motion.     Cervical back: Normal range of motion. No rigidity or tenderness.  Skin:    General: Skin is warm and dry.  Neurological:     General: No focal deficit present.     Mental Status: She is alert and oriented to person, place, and time.  Psychiatric:        Attention and Perception: She perceives auditory hallucinations.        Mood and Affect: Mood is anxious. Affect is flat.        Speech: Speech normal.        Behavior: Behavior is cooperative.        Thought Content: Thought content is paranoid and  delusional.        Cognition and Memory: Cognition and memory normal.        Judgment: Judgment is impulsive.     Review of Systems  Constitutional: Positive for fatigue. Negative for appetite change.  HENT: Negative for rhinorrhea and sore throat.   Eyes: Negative for photophobia and visual disturbance.  Respiratory: Negative for cough and shortness of breath.   Cardiovascular: Negative for chest pain and palpitations.  Gastrointestinal: Negative for diarrhea, nausea and vomiting.  Endocrine: Negative for cold intolerance and heat intolerance.  Genitourinary: Negative for difficulty urinating and dysuria.  Musculoskeletal: Negative for arthralgias, myalgias, neck pain and neck stiffness.  Skin: Negative for rash and wound.  Allergic/Immunologic: Negative for food allergies and immunocompromised state.  Neurological: Positive for tremors and headaches.  Hematological: Negative for adenopathy.  Psychiatric/Behavioral: Positive for hallucinations. Negative for suicidal ideas. The patient is nervous/anxious.     Blood pressure 104/62, pulse 66, temperature 97.7 F (36.5 C), temperature source Oral, resp. rate 18, height 5\' 11"  (1.803 m), weight 63.5 kg, last menstrual period 03/30/2020, SpO2 99 %.Body mass index is 19.53 kg/m.  General Appearance: Disheveled  Eye Contact:  Fair  Speech:  Slow  Volume:  Decreased  Mood:  Anxious  Affect:  Congruent  Thought Process:  Disorganized  Orientation:  Full (Time, Place, and Person)  Thought Content:  Illogical, Delusions, Hallucinations: Auditory, Paranoid Ideation and Rumination  Suicidal Thoughts:  No  Homicidal Thoughts:  No  Memory:  Immediate;   Fair Recent;   Fair Remote;   Fair  Judgement:  Impaired  Insight:  limited  Psychomotor Activity:  Restlessness and Tremor  Concentration:  Concentration: Fair and Attention Span: Fair  Recall:  [employer] of Knowledge:  Fair  Language:  Fair  Akathisia:  No  Handed:  Right  AIMS (if  indicated):     Assets:  Desire for Improvement Housing Social Support  ADL's:  impaired  Cognition:  Impaired,  Mild  Sleep:  Number of Hours: 8.75     Treatment Plan Summary: Daily contact with patient to assess and evaluate symptoms and progress in treatment and Medication management   Patient is a 19 year old female with the above-stated past psychiatric history who is seen in follow-up.  Chart reviewed. Patient discussed with nursing. Patient continues to express psychotic symptoms, depression, anxiety. Symptoms did not improve with Seroquel up to 450 mg daily, or with Risperdal up to 4 mg daily. Both have been tapered and discontinued  Plan: -continue inpatient psych admission; 15-minute checks; daily contact with patient to assess and evaluate symptoms and progress in treatment; psychoeducation. Medications: -Continue Haldol to 10 mg QHS for psychosis, increase cogentin 2 mg BID for EPS. -Continue Luvox to 100mg  BID for depression, anxiety and OCD. -Continue  Haldol/Ativan/Bendaryl PRN for acute agitation.  -Pertinent Labs: no new labs ordered today -EKG: on 12/12 QTc 428 -Consults: No new consults placed since yesterday  -Disposition: patient is not ready for d/c at this time yet. - I certify that the patient does need, on a daily basis, active treatment furnished directly by or requiring the supervision of inpatient psychiatric facility personnel.   14/12, MD 05/08/2020, 9:28 AM

## 2020-05-08 NOTE — Plan of Care (Signed)
D- Patient alert and oriented. Patient presents in a sad, but pleasant mood on assessment stating that she slept good last night and had some complaints of a headache. Patient rated her pain a "3/10", however, she did not request any pain medication from this Clinical research associate. Patient endorses both depression and anxiety, stating that "being here too long", is why she's feeling this way. Patient denies SI, HI, AVH at this time. Patient's goal for today is to "stay calm".  A- Scheduled medications administered to patient, per MD orders. Support and encouragement provided.  Routine safety checks conducted every 15 minutes.  Patient informed to notify staff with problems or concerns.  R- No adverse drug reactions noted. Patient contracts for safety at this time. Patient compliant with medications and treatment plan. Patient receptive, calm, and cooperative. Patient interacts well with others on the unit.  Patient remains safe at this time.  Problem: Consults Goal: Concurrent Medical Patient Education Description: (See Patient Education Module for education specifics) Outcome: Progressing   Problem: [facility] Concurrent Medical Problem Goal: LTG-Pt will be physically stable and he/significant other Description: (Patient will be physically stable and he/significant other will be able to verbalize understanding of follow-up care and symptoms that would warrant further treatment) Outcome: Progressing Goal: STG-Vital signs will be within defined limits or stabilized Description: (STG- Vital signs will be within defined limits or stabilized for individual) Outcome: Progressing Goal: STG-Compliance with medication and/or treatment as ordered Description: (STG-Compliance with medication and/or treatment as ordered by MD) Outcome: Progressing Goal: STG-Verbalize two symptoms that would warrant further Description: (STG-Verbalize two symptoms that would warrant further treatment) Outcome: Progressing Goal: STG-Patient  will participate in management/stabilization Description: (STG-Patient will participate in management/stabilization of medical condition) Outcome: Progressing Goal: STG-Other (Specify): Description: STG-Other Concurrent Medical (Specify): Outcome: Progressing   Problem: Education: Goal: Utilization of techniques to improve thought processes will improve Outcome: Progressing Goal: Knowledge of the prescribed therapeutic regimen will improve Outcome: Progressing   Problem: Activity: Goal: Interest or engagement in leisure activities will improve Outcome: Progressing Goal: Imbalance in normal sleep/wake cycle will improve Outcome: Progressing   Problem: Coping: Goal: Coping ability will improve Outcome: Progressing Goal: Will verbalize feelings Outcome: Progressing   Problem: Health Behavior/Discharge Planning: Goal: Ability to make decisions will improve Outcome: Progressing Goal: Compliance with therapeutic regimen will improve Outcome: Progressing   Problem: Role Relationship: Goal: Will demonstrate positive changes in social behaviors and relationships Outcome: Progressing   Problem: Safety: Goal: Ability to disclose and discuss suicidal ideas will improve Outcome: Progressing Goal: Ability to identify and utilize support systems that promote safety will improve Outcome: Progressing   Problem: Self-Concept: Goal: Will verbalize positive feelings about self Outcome: Progressing Goal: Level of anxiety will decrease Outcome: Progressing   Problem: Education: Goal: Knowledge of [facility] Education information/materials will improve Outcome: Progressing Goal: Emotional status will improve Outcome: Progressing Goal: Mental status will improve Outcome: Progressing Goal: Verbalization of understanding the information provided will improve Outcome: Progressing

## 2020-05-08 NOTE — Progress Notes (Signed)
Recreation Therapy Notes   Date: 05/08/2020  Time: 9:30 am   Location: Craft room     Behavioral response: N/A   Intervention Topic: Happiness    Discussion/Intervention: Patient did not attend group.   Clinical Observations/Feedback:  Patient did not attend group.   Kirke Breach LRT/CTRS        Mareena Cavan 05/08/2020 11:47 AM

## 2020-05-08 NOTE — Progress Notes (Signed)
Patient comes out for meals and medication, however, she continues to rest throughout the day.

## 2020-05-09 MED ORDER — BENZTROPINE MESYLATE 1 MG PO TABS
3.0000 mg | ORAL_TABLET | Freq: Two times a day (BID) | ORAL | Status: DC
  Administered 2020-05-09 – 2020-05-11 (×4): 3 mg via ORAL
  Filled 2020-05-09 (×4): qty 3

## 2020-05-09 MED ORDER — HALOPERIDOL 5 MG PO TABS
7.5000 mg | ORAL_TABLET | Freq: Every day | ORAL | Status: DC
  Administered 2020-05-09: 21:00:00 7.5 mg via ORAL
  Filled 2020-05-09: qty 2

## 2020-05-09 NOTE — [facility] Group Notes (Signed)
LCSW Group Therapy Note  05/09/2020 2:05 PM  Type of Therapy/Topic:  Group Therapy:  Emotion Regulation  Participation Level:  Did Not Attend   Description of Group:   The purpose of this group is to assist patients in learning to regulate negative emotions and experience positive emotions. Patients will be guided to discuss ways in which they have been vulnerable to their negative emotions. These vulnerabilities will be juxtaposed with experiences of positive emotions or situations, and patients will be challenged to use positive emotions to combat negative ones. Special emphasis will be placed on coping with negative emotions in conflict situations, and patients will process healthy conflict resolution skills.  Therapeutic Goals: 1. Patient will identify two positive emotions or experiences to reflect on in order to balance out negative emotions 2. Patient will label two or more emotions that they find the most difficult to experience 3. Patient will demonstrate positive conflict resolution skills through discussion and/or role plays  Summary of Patient Progress: X  Therapeutic Modalities:   Cognitive Behavioral Therapy Feelings Identification Dialectical Behavioral Therapy  Jquan Egelston, MSW, LCSW 05/09/2020 2:05 PM  

## 2020-05-09 NOTE — Plan of Care (Signed)
  Problem: Group Participation Goal: STG - Patient will engage in groups without prompting or encouragement from LRT x3 group sessions within 5 recreation therapy group sessions Description: STG - Patient will engage in groups without prompting or encouragement from LRT x3 group sessions within 5 recreation therapy group sessions Outcome: Not Progressing   

## 2020-05-09 NOTE — Progress Notes (Signed)
Recreation Therapy Notes  Date: 05/09/2020  Time: 9:30 am   Location: Craft room     Behavioral response: N/A   Intervention Topic: Self-care      Discussion/Intervention: Patient did not attend group.   Clinical Observations/Feedback:  Patient did not attend group.   Zaki Gertsch LRT/CTRS        Hether Anselmo 05/09/2020 11:53 AM

## 2020-05-09 NOTE — Progress Notes (Signed)
[facility]  05/09/2020 12:26 PM Robin Hess  MRN:  [id number]  Principal Problem: Psychosis [facility]) Diagnosis: Principal Problem:   Psychosis (HCC) Active Problems:   Social anxiety disorder   OCD (obsessive compulsive disorder)  Robin Hess is a  19y.o. female patient [facility].  Interval History Patient was seen today for re-evaluation.  Nursing reports no events overnight. Patient has been medication compliant.   Subjective:  Today, patient assessed one-on-one at bedside today. She continues to have  tremor today in her hands, but no acute dystonia noted. She states she is feeling better today. She states she is feeling better today, and feels she is able to recognize a lot of her previous fears were just in her head. Today she denies suicidal ideations, homicidal ideations, auditory hallucinations, or visual hallucinations. Will decrease Haldol to 7.5 mg QHS and increase Cogentin to 3 mg BID for ongoing EPS side effects today.   Robin Hess contacted at [phone]: Robin Hess has noticed some improvement in her daughter, but notes she is not back to baseline at this time. We discussed her current medication regimen, and plan of care.   Labs: no new results for review.  Total Time spent with patient: 30 minutes  Past Psychiatric History: Patient has had 2 prior hospitalizations in 2015 and 2019. Last diagnosis was depression and OCD. Did not follow-up with outpatient treatment. Not currently getting any outpatient treatment. Has made suicidal statements in the past. Recently had not been trying to hurt her self or showing any violence to others. Some history of cannabis use in the past but currently does not seem to be an active issue.   Past Medical History:  Past Medical History:  Diagnosis Date  . Allergy   . Anxiety   . Cannabis use disorder, moderate, dependence (HCC)   . Depression   . OCD (obsessive compulsive  disorder)   . Severe major depression, single episode, with psychotic features (HCC)   . Social anxiety disorder    History reviewed. No pertinent surgical history. Family History: History reviewed. No pertinent family history. Family Psychiatric  History: None reported Social History:  Social History   Substance and Sexual Activity  Alcohol Use No     Social History   Substance and Sexual Activity  Drug Use Yes  . Types: Marijuana    Social History   Socioeconomic History  . Marital status: Single    Spouse name: Not on file  . Number of children: Not on file  . Years of education: Not on file  . Highest education level: Not on file  Occupational History  . Not on file  Tobacco Use  . Smoking status: Never Smoker  . Smokeless tobacco: Never Used  Substance and Sexual Activity  . Alcohol use: No  . Drug use: Yes    Types: Marijuana  . Sexual activity: Never    Birth control/protection: None  Other Topics Concern  . Not on file  Social History Narrative  . Not on file   Social Determinants of Health   Financial Resource Strain: Not on file  Food Insecurity: Not on file  Transportation Needs: Not on file  Physical Activity: Not on file  Stress: Not on file  Social Connections: Not on file   Additional Social History:                         Sleep: Fair  Appetite:  Fair  Current Medications: Current Facility-Administered Medications  Medication Dose Route Frequency Provider Last Rate Last Admin  . acetaminophen (TYLENOL) tablet 650 mg  650 mg Oral Q6H PRN Clapacs, [name], MD   650 mg at 05/09/20 0809  . alum & mag hydroxide-simeth (MAALOX/MYLANTA) 200-200-20 MG/5ML suspension 30 mL  30 mL Oral Q4H PRN Clapacs, [name], MD      . benztropine (COGENTIN) tablet 3 mg  3 mg Oral BID [name], MD      . chlorproMAZINE (THORAZINE) injection 25 mg  25 mg Intramuscular TID PRN [name], MD      . haloperidol (HALDOL) tablet 5 mg  5 mg Oral  Q8H PRN [name], MD   5 mg at 04/29/20 1529   And  . LORazepam (ATIVAN) tablet 2 mg  2 mg Oral Q8H PRN [name], MD   2 mg at 04/27/20 1627   And  . diphenhydrAMINE (BENADRYL) capsule 50 mg  50 mg Oral Q8H PRN [name], MD   50 mg at 04/29/20 1529  . feeding supplement (ENSURE ENLIVE / ENSURE PLUS) liquid 237 mL  237 mL Oral BID BM [name], MD   237 mL at 05/08/20 1040  . fluvoxaMINE (LUVOX) tablet 100 mg  100 mg Oral BID Thalia Party, MD   100 mg at 05/09/20 0806  . haloperidol (HALDOL) tablet 7.5 mg  7.5 mg Oral QHS [name], MD      . hydrOXYzine (ATARAX/VISTARIL) tablet 50 mg  50 mg Oral TID PRN Clapacs, [name], MD   50 mg at 05/09/20 0809  . LORazepam (ATIVAN) tablet 2 mg  2 mg Oral Q6H PRN [name], MD   2 mg at 05/07/20 1208   Or  . LORazepam (ATIVAN) injection 2 mg  2 mg Intramuscular Q6H PRN [name], MD   2 mg at 04/26/20 1645  . magnesium hydroxide (MILK OF MAGNESIA) suspension 30 mL  30 mL Oral Daily PRN Clapacs, [name], MD      . sodium chloride (OCEAN) 0.65 % nasal spray 2 spray  2 spray Each Nare Q4H PRN [name], MD        Lab Results: No results found for this or any previous visit (from the past 48 hour(s)).  Blood Alcohol level:  Lab Results  Component Value Date   ETH <10 04/12/2020   ETH <10 03/03/2018    Metabolic Disorder Labs: Lab Results  Component Value Date   HGBA1C 4.7 (L) 04/13/2020   MPG 88.19 04/13/2020   MPG 93.93 03/03/2018   Lab Results  Component Value Date   PROLACTIN 17.5 05/02/2014   Lab Results  Component Value Date   CHOL 122 04/13/2020   TRIG 50 04/13/2020   HDL 48 04/13/2020   CHOLHDL 2.5 04/13/2020   VLDL 10 04/13/2020   LDLCALC 64 04/13/2020   LDLCALC 90 03/03/2018    Physical Findings: AIMS: Facial and Oral Movements Muscles of Facial Expression: None, normal Lips and Perioral Area: None, normal Jaw: None, normal Tongue: None, normal,Extremity  Movements Upper (arms, wrists, hands, fingers): None, normal Lower (legs, knees, ankles, toes): None, normal, Trunk Movements Neck, shoulders, hips: None, normal, Overall Severity Severity of abnormal movements (highest score from questions above): None, normal Incapacitation due to abnormal movements: None, normal Patient's awareness of abnormal movements (rate only patient's report): No Awareness, Dental Status Current problems with teeth and/or dentures?: No  CIWA:  CIWA-Ar Total: 0 COWS:  COWS Total Score: 2  Musculoskeletal: Strength & Muscle Tone: within normal limits Gait & Station: walks on tiptoes Patient leans: N/A  Psychiatric Specialty Exam: Physical Exam Vitals and nursing note reviewed.  Constitutional:      Appearance: Normal appearance.  HENT:     Head: Normocephalic and atraumatic.     Right Ear: External ear normal.     Left Ear: External ear normal.     Nose: Nose normal.     Mouth/Throat:     Mouth: Mucous membranes are moist.     Pharynx: Oropharynx is clear.  Eyes:     Extraocular Movements: Extraocular movements intact.     Conjunctiva/sclera: Conjunctivae normal.     Pupils: Pupils are equal, round, and reactive to light.  Cardiovascular:     Rate and Rhythm: Normal rate.     Pulses: Normal pulses.  Pulmonary:     Effort: Pulmonary effort is normal.     Breath sounds: Normal breath sounds.  Abdominal:     General: Abdomen is flat.     Palpations: Abdomen is soft.  Musculoskeletal:        General: No swelling or tenderness. Normal range of motion.     Cervical back: Normal range of motion. No rigidity or tenderness.  Skin:    General: Skin is warm and dry.  Neurological:     General: No focal deficit present.     Mental Status: She is alert and oriented to person, place, and time.  Psychiatric:        Attention and Perception: She perceives auditory hallucinations.        Mood and Affect: Mood is anxious. Affect is flat.        Speech:  Speech normal.        Behavior: Behavior is cooperative.        Thought Content: Thought content is paranoid and delusional.        Cognition and Memory: Cognition and memory normal.        Judgment: Judgment is impulsive.     Review of Systems  Constitutional: Positive for fatigue. Negative for appetite change.  HENT: Negative for rhinorrhea and sore throat.   Eyes: Negative for photophobia and visual disturbance.  Respiratory: Negative for cough and shortness of breath.   Cardiovascular: Negative for chest pain and palpitations.  Gastrointestinal: Negative for diarrhea, nausea and vomiting.  Endocrine: Negative for cold intolerance and heat intolerance.  Genitourinary: Negative for difficulty urinating and dysuria.  Musculoskeletal: Negative for arthralgias, myalgias, neck pain and neck stiffness.  Skin: Negative for rash and wound.  Allergic/Immunologic: Negative for food allergies and immunocompromised state.  Neurological: Positive for tremors and headaches.  Hematological: Negative for adenopathy.  Psychiatric/Behavioral: Positive for hallucinations. Negative for suicidal ideas. The patient is nervous/anxious.     Blood pressure (!) 95/58, pulse 90, temperature 97.9 F (36.6 C), temperature source Oral, resp. rate 17, height 5\' 11"  (1.803 m), weight 63.5 kg, last menstrual period 03/30/2020, SpO2 99 %.Body mass index is 19.53 kg/m.  General Appearance: Casual  Eye Contact:  Fair  Speech:  Slow  Volume:  Decreased  Mood:  "A little anxious, but better than yesterday"  Affect:  Congruent  Thought Process:  Coherent  Orientation:  Full (Time, Place, and Person)  Thought Content:  Logical  Suicidal Thoughts:  No  Homicidal Thoughts:  No  Memory:  Immediate;   Fair Recent;   Fair Remote;  Fair  Judgement:  Intact  Insight:  limited  Psychomotor Activity:  Restlessness and Tremor  Concentration:  Concentration: Fair and Attention Span: Fair  Recall:  [employer] of  Knowledge:  Fair  Language:  Fair  Akathisia:  No  Handed:  Right  AIMS (if indicated):     Assets:  Desire for Improvement Housing Social Support  ADL's:  impaired  Cognition:  Impaired,  Mild  Sleep:  Number of Hours: 8     Treatment Plan Summary: Daily contact with patient to assess and evaluate symptoms and progress in treatment and Medication management   Patient is a 19 year old female with the above-stated past psychiatric history who is seen in follow-up.  Chart reviewed. Patient discussed with nursing. Patient continues to express psychotic symptoms, depression, anxiety. Symptoms did not improve with Seroquel up to 450 mg daily, or with Risperdal up to 4 mg daily. Both have been tapered and discontinued  Plan: -continue inpatient psych admission; 15-minute checks; daily contact with patient to assess and evaluate symptoms and progress in treatment; psychoeducation. Medications: -Decrease Haldol to 7.5 mg QHS for psychosis, increase cogentin 3 mg BID for EPS. -Continue Luvox to 100mg  BID for depression, anxiety and OCD. -Continue  Haldol/Ativan/Bendaryl PRN for acute agitation.  -Pertinent Labs: no new labs ordered today -EKG: on 12/12 QTc 428 -Consults: No new consults placed since yesterday  -Disposition: patient is not ready for d/c at this time yet. - I certify that the patient does need, on a daily basis, active treatment furnished directly by or requiring the supervision of inpatient psychiatric facility personnel.   14/12, MD 05/09/2020, 12:26 PM

## 2020-05-09 NOTE — Plan of Care (Signed)
  Problem: Education: Goal: Utilization of techniques to improve thought processes will improve Outcome: Progressing Goal: Knowledge of the prescribed therapeutic regimen will improve Outcome: Progressing   Problem: Activity: Goal: Interest or engagement in leisure activities will improve Outcome: Progressing   Problem: Coping: Goal: Coping ability will improve Outcome: Progressing Goal: Will verbalize feelings Outcome: Progressing

## 2020-05-09 NOTE — Plan of Care (Signed)
D- Patient alert and oriented. Patient presented in a sad, but pleasant mood on assessment stating that she slept good last night and had a complaint of a slight headache, rating her pain a "4/10", in which she did request PRN medication from this Clinical research associate. Patient endorsed both depression and anxiety, stating that she feels like she's going to be stuck here for life. Patient denies SI, HI, AVH at this time. Patient's goal for today is to "eat more and not get dehydrated".  A- Scheduled medications administered to patient, per MD orders. Support and encouragement provided.  Routine safety checks conducted every 15 minutes.  Patient informed to notify staff with problems or concerns.  R- No adverse drug reactions noted. Patient contracts for safety at this time. Patient compliant with medications and treatment plan. Patient receptive, calm, and cooperative. Patient interacts well with others on the unit.  Patient remains safe at this time.  Problem: Consults Goal: Concurrent Medical Patient Education Description: (See Patient Education Module for education specifics) Outcome: Progressing   Problem: [facility] Concurrent Medical Problem Goal: LTG-Pt will be physically stable and he/significant other Description: (Patient will be physically stable and he/significant other will be able to verbalize understanding of follow-up care and symptoms that would warrant further treatment) Outcome: Progressing Goal: STG-Vital signs will be within defined limits or stabilized Description: (STG- Vital signs will be within defined limits or stabilized for individual) Outcome: Progressing Goal: STG-Compliance with medication and/or treatment as ordered Description: (STG-Compliance with medication and/or treatment as ordered by MD) Outcome: Progressing Goal: STG-Verbalize two symptoms that would warrant further Description: (STG-Verbalize two symptoms that would warrant further treatment) Outcome: Progressing Goal:  STG-Patient will participate in management/stabilization Description: (STG-Patient will participate in management/stabilization of medical condition) Outcome: Progressing Goal: STG-Other (Specify): Description: STG-Other Concurrent Medical (Specify): Outcome: Progressing   Problem: Education: Goal: Utilization of techniques to improve thought processes will improve Outcome: Progressing Goal: Knowledge of the prescribed therapeutic regimen will improve Outcome: Progressing   Problem: Activity: Goal: Interest or engagement in leisure activities will improve Outcome: Progressing Goal: Imbalance in normal sleep/wake cycle will improve Outcome: Progressing   Problem: Coping: Goal: Coping ability will improve Outcome: Progressing Goal: Will verbalize feelings Outcome: Progressing   Problem: Health Behavior/Discharge Planning: Goal: Ability to make decisions will improve Outcome: Progressing Goal: Compliance with therapeutic regimen will improve Outcome: Progressing   Problem: Role Relationship: Goal: Will demonstrate positive changes in social behaviors and relationships Outcome: Progressing   Problem: Safety: Goal: Ability to disclose and discuss suicidal ideas will improve Outcome: Progressing Goal: Ability to identify and utilize support systems that promote safety will improve Outcome: Progressing   Problem: Self-Concept: Goal: Will verbalize positive feelings about self Outcome: Progressing Goal: Level of anxiety will decrease Outcome: Progressing   Problem: Education: Goal: Knowledge of [place] [facility] Education information/materials will improve Outcome: Progressing Goal: Emotional status will improve Outcome: Progressing Goal: Mental status will improve Outcome: Progressing Goal: Verbalization of understanding the information provided will improve Outcome: Progressing

## 2020-05-10 MED ORDER — HALOPERIDOL 5 MG PO TABS
10.0000 mg | ORAL_TABLET | Freq: Every day | ORAL | Status: DC
  Administered 2020-05-10 – 2020-05-15 (×6): 10 mg via ORAL
  Filled 2020-05-10 (×6): qty 2

## 2020-05-10 NOTE — [facility] Counselor (Signed)
CSW again attempted to contact the patient's identified provider to reschedule the patien'ts appointment.  CSW left HIPAA compliant voicemail, containing contact information.  Penni Homans, MSW, LCSW 05/10/2020 11:54 AM

## 2020-05-10 NOTE — [facility] Group Notes (Signed)
LCSW Group Therapy Note  05/10/2020 2:21 PM  Type of Therapy/Topic:  Group Therapy:  Balance in Life  Participation Level:  Active  Description of Group:    This group will address the concept of balance and how it feels and looks when one is unbalanced. Patients will be encouraged to process areas in their lives that are out of balance and identify reasons for remaining unbalanced. Facilitators will guide patients in utilizing problem-solving interventions to address and correct the stressor making their life unbalanced. Understanding and applying boundaries will be explored and addressed for obtaining and maintaining a balanced life. Patients will be encouraged to explore ways to assertively make their unbalanced needs known to significant others in their lives, using other group members and facilitator for support and feedback.  Therapeutic Goals: 1. Patient will identify two or more emotions or situations they have that consume much of in their lives. 2. Patient will identify signs/triggers that life has become out of balance:  3. Patient will identify two ways to set boundaries in order to achieve balance in their lives:  4. Patient will demonstrate ability to communicate their needs through discussion and/or role plays  Summary of Patient Progress: Patient participated in the icebreaker and in the coloring/mindfulness activity.  Therapeutic Modalities:   Cognitive Behavioral Therapy Solution-Focused Therapy Assertiveness Training  [employer]. Algis Greenhouse, MSW, LCSW, LCAS 05/10/2020 2:21 PM

## 2020-05-10 NOTE — Progress Notes (Signed)
[facility] MD Progress Note  05/10/2020 10:01 AM Robin Hess  MRN:  [id number]  Principal Problem: Psychosis (HCC) Diagnosis: Principal Problem:   Psychosis (HCC) Active Problems:   Social anxiety disorder   OCD (obsessive compulsive disorder)  [name] is a  19y.o. female patient who presents to the [facility] unit for treatment of psychosis.  Interval History Patient was seen today for re-evaluation.  Nursing reports no events overnight. Patient has been medication compliant.   Subjective:  Today, patient assessed one-on-one at bedside today. Tremor in hands has resolved today. She states she is feeling a little anxious today. She worries that her family may be mad at her for running away, and feels she should never have done that. She wonders if her family is safe today. Today she denies suicidal ideations, homicidal ideations, auditory hallucinations, or visual hallucinations. Will resume Haldol to 10 mg QHS and continue cogentin 3 mg BID now that tremor has resolved. Encouraged patient to contact her family again today.   Labs: no new results for review.  Total Time spent with patient: 30 minutes  Past Psychiatric History: Patient has had 2 prior hospitalizations in 2015 and 2019. Last diagnosis was depression and OCD. Did not follow-up with outpatient treatment. Not currently getting any outpatient treatment. Has made suicidal statements in the past. Recently had not been trying to hurt her self or showing any violence to others. Some history of cannabis use in the past but currently does not seem to be an active issue.   Past Medical History:  Past Medical History:  Diagnosis Date  . Allergy   . Anxiety   . Cannabis use disorder, moderate, dependence (HCC)   . Depression   . OCD (obsessive compulsive disorder)   . Severe major depression, single episode, with psychotic features (HCC)   . Social anxiety disorder    History reviewed. No pertinent surgical history. Family History:  History reviewed. No pertinent family history. Family Psychiatric  History: None reported Social History:  Social History   Substance and Sexual Activity  Alcohol Use No     Social History   Substance and Sexual Activity  Drug Use Yes  . Types: Marijuana    Social History   Socioeconomic History  . Marital status: Single    Spouse name: Not on file  . Number of children: Not on file  . Years of education: Not on file  . Highest education level: Not on file  Occupational History  . Not on file  Tobacco Use  . Smoking status: Never Smoker  . Smokeless tobacco: Never Used  Substance and Sexual Activity  . Alcohol use: No  . Drug use: Yes    Types: Marijuana  . Sexual activity: Never    Birth control/protection: None  Other Topics Concern  . Not on file  Social History Narrative  . Not on file   Social Determinants of Health   Financial Resource Strain: Not on file  Food Insecurity: Not on file  Transportation Needs: Not on file  Physical Activity: Not on file  Stress: Not on file  Social Connections: Not on file   Additional Social History:                         Sleep: Fair  Appetite:  Fair  Current Medications: Current Facility-Administered Medications  Medication Dose Route Frequency Provider Last Rate Last Admin  . acetaminophen (TYLENOL) tablet 650 mg  650 mg Oral Q6H PRN Clapacs,  Jackquline DenmarkJohn T, MD   650 mg at 05/09/20 0809  . alum & mag hydroxide-simeth (MAALOX/MYLANTA) 200-200-20 MG/5ML suspension 30 mL  30 mL Oral Q4H PRN Clapacs, [name], MD      . benztropine (COGENTIN) tablet 3 mg  3 mg Oral BID Jesse SansFreeman, Dominic Mahaney M, MD   3 mg at 05/10/20 16100822  . chlorproMAZINE (THORAZINE) injection 25 mg  25 mg Intramuscular TID PRN Jesse SansFreeman, Eleen Litz M, MD      . haloperidol (HALDOL) tablet 5 mg  5 mg Oral Q8H PRN Jesse SansFreeman, Viva Gallaher M, MD   5 mg at 04/29/20 1529   And  . LORazepam (ATIVAN) tablet 2 mg  2 mg Oral Q8H PRN Jesse SansFreeman, Justin Meisenheimer M, MD   2 mg at 04/27/20 1627    And  . diphenhydrAMINE (BENADRYL) capsule 50 mg  50 mg Oral Q8H PRN Jesse SansFreeman, Jayme Mednick M, MD   50 mg at 04/29/20 1529  . feeding supplement (ENSURE ENLIVE / ENSURE PLUS) liquid 237 mL  237 mL Oral BID BM Jesse SansFreeman, Diar Berkel M, MD   237 mL at 05/09/20 1421  . fluvoxaMINE (LUVOX) tablet 100 mg  100 mg Oral BID Thalia PartyPaliy, Alisa, MD   100 mg at 05/10/20 0823  . haloperidol (HALDOL) tablet 10 mg  10 mg Oral QHS Jesse SansFreeman, Jazzmon Prindle M, MD      . hydrOXYzine (ATARAX/VISTARIL) tablet 50 mg  50 mg Oral TID PRN Clapacs, Jackquline DenmarkJohn T, MD   50 mg at 05/09/20 0809  . LORazepam (ATIVAN) tablet 2 mg  2 mg Oral Q6H PRN Jesse SansFreeman, Ember Henrikson M, MD   2 mg at 05/09/20 1436   Or  . LORazepam (ATIVAN) injection 2 mg  2 mg Intramuscular Q6H PRN Jesse SansFreeman, Allan Minotti M, MD   2 mg at 04/26/20 1645  . magnesium hydroxide (MILK OF MAGNESIA) suspension 30 mL  30 mL Oral Daily PRN Clapacs, [name], MD      . sodium chloride (OCEAN) 0.65 % nasal spray 2 spray  2 spray Each Nare Q4H PRN Jesse SansFreeman, Winta Barcelo M, MD        Lab Results: No results found for this or any previous visit (from the past 48 hour(s)).  Blood Alcohol level:  Lab Results  Component Value Date   ETH <10 04/12/2020   ETH <10 03/03/2018    Metabolic Disorder Labs: Lab Results  Component Value Date   HGBA1C 4.7 (L) 04/13/2020   MPG 88.19 04/13/2020   MPG 93.93 03/03/2018   Lab Results  Component Value Date   PROLACTIN 17.5 05/02/2014   Lab Results  Component Value Date   CHOL 122 04/13/2020   TRIG 50 04/13/2020   HDL 48 04/13/2020   CHOLHDL 2.5 04/13/2020   VLDL 10 04/13/2020   LDLCALC 64 04/13/2020   LDLCALC 90 03/03/2018    Physical Findings: AIMS: Facial and Oral Movements Muscles of Facial Expression: None, normal Lips and Perioral Area: None, normal Jaw: None, normal Tongue: None, normal,Extremity Movements Upper (arms, wrists, hands, fingers): None, normal Lower (legs, knees, ankles, toes): None, normal, Trunk Movements Neck, shoulders, hips: None, normal, Overall  Severity Severity of abnormal movements (highest score from questions above): None, normal Incapacitation due to abnormal movements: None, normal Patient's awareness of abnormal movements (rate only patient's report): No Awareness, Dental Status Current problems with teeth and/or dentures?: No  CIWA:  CIWA-Ar Total: 0 COWS:  COWS Total Score: 2  Musculoskeletal: Strength & Muscle Tone: within normal limits Gait & Station: walks on tiptoes Patient leans: N/A  Psychiatric Specialty Exam: Physical Exam Vitals and nursing note reviewed.  Constitutional:      Appearance: Normal appearance.  HENT:     Head: Normocephalic and atraumatic.     Right Ear: External ear normal.     Left Ear: External ear normal.     Nose: Nose normal.     Mouth/Throat:     Mouth: Mucous membranes are moist.     Pharynx: Oropharynx is clear.  Eyes:     Extraocular Movements: Extraocular movements intact.     Conjunctiva/sclera: Conjunctivae normal.     Pupils: Pupils are equal, round, and reactive to light.  Cardiovascular:     Rate and Rhythm: Normal rate.     Pulses: Normal pulses.  Pulmonary:     Effort: Pulmonary effort is normal.     Breath sounds: Normal breath sounds.  Abdominal:     General: Abdomen is flat.     Palpations: Abdomen is soft.  Musculoskeletal:        General: No swelling or tenderness. Normal range of motion.     Cervical back: Normal range of motion. No rigidity or tenderness.  Skin:    General: Skin is warm and dry.  Neurological:     General: No focal deficit present.     Mental Status: She is alert and oriented to person, place, and time.  Psychiatric:        Attention and Perception: She perceives auditory hallucinations.        Mood and Affect: Mood is anxious. Affect is flat.        Speech: Speech normal.        Behavior: Behavior is cooperative.        Thought Content: Thought content is paranoid and delusional.        Cognition and Memory: Cognition and memory  normal.        Judgment: Judgment is impulsive.     Review of Systems  Constitutional: Positive for fatigue. Negative for appetite change.  HENT: Negative for rhinorrhea and sore throat.   Eyes: Negative for photophobia and visual disturbance.  Respiratory: Negative for cough and shortness of breath.   Cardiovascular: Negative for chest pain and palpitations.  Gastrointestinal: Negative for diarrhea, nausea and vomiting.  Endocrine: Negative for cold intolerance and heat intolerance.  Genitourinary: Negative for difficulty urinating and dysuria.  Musculoskeletal: Negative for arthralgias, myalgias, neck pain and neck stiffness.  Skin: Negative for rash and wound.  Allergic/Immunologic: Negative for food allergies and immunocompromised state.  Neurological: Positive for tremors and headaches.  Hematological: Negative for adenopathy.  Psychiatric/Behavioral: Positive for hallucinations. Negative for suicidal ideas. The patient is nervous/anxious.     Blood pressure (!) 85/51, pulse 82, temperature 97.8 F (36.6 C), temperature source Oral, resp. rate 17, height 5\' 11"  (1.803 m), weight 63.5 kg, last menstrual period 03/30/2020, SpO2 (!) 89 %.Body mass index is 19.53 kg/m.  General Appearance: Casual  Eye Contact:  Fair  Speech:  Slow  Volume:  Decreased  Mood:  "A little anxious, but better than yesterday"  Affect:  Congruent  Thought Process:  Coherent  Orientation:  Full (Time, Place, and Person)  Thought Content:  Logical  Suicidal Thoughts:  No  Homicidal Thoughts:  No  Memory:  Immediate;   Fair Recent;   Fair Remote;   Fair  Judgement:  Intact  Insight:  limited  Psychomotor Activity:  Restlessness  Concentration:  Concentration: Fair and Attention Span: Fair  Recall:  13/09/2019  of Knowledge:  Fair  Language:  Fair  Akathisia:  No  Handed:  Right  AIMS (if indicated):     Assets:  Desire for Improvement Housing Social Support  ADL's:  impaired  Cognition:   Impaired,  Mild  Sleep:  Number of Hours: 5     Treatment Plan Summary: Daily contact with patient to assess and evaluate symptoms and progress in treatment and Medication management   Patient is a 19 year old female with the above-stated past psychiatric history who is seen in follow-up.  Chart reviewed. Patient discussed with nursing. Patient continues to express psychotic symptoms, depression, anxiety. Symptoms did not improve with Seroquel up to 450 mg daily, or with Risperdal up to 4 mg daily. Both have been tapered and discontinued  Plan: -continue inpatient psych admission; 15-minute checks; daily contact with patient to assess and evaluate symptoms and progress in treatment; psychoeducation. Medications: -Resume Haldol to 10 mg QHS for psychosis now that tremor has resolved, continue cogentin 3 mg BID for EPS. -Continue Luvox to 100mg  BID for depression, anxiety and OCD. -Continue  Haldol/Ativan/Bendaryl PRN for acute agitation.  -Pertinent Labs: no new labs ordered today -EKG: on 12/12 QTc 428 -Consults: No new consults placed since yesterday  -Disposition: patient is not ready for d/c at this time yet. - I certify that the patient does need, on a daily basis, active treatment furnished directly by or requiring the supervision of inpatient psychiatric facility personnel.   14/12, MD 05/10/2020, 10:01 AM

## 2020-05-10 NOTE — Progress Notes (Signed)
Recreation Therapy Notes   Date: 05/10/2020  Time: 9:30 am   Location: Craft room  Behavioral response: Appropriate  Intervention Topic: Emotions   Discussion/Intervention:  Group content on today was focused on emotions. The group identified what emotions are and why it is important to have emotions. Patients expressed some positive and negative emotions. Individuals gave some past experiences on how they normally dealt with emotions in the past. The group described some positive ways to deal with emotions in the future. Patients participated in the intervention "Name the Song" where individuals were given a chance to experience different emotions.  Clinical Observations/Feedback: Patient came to group and was focused on what peers and staff had to say about emotions. Individual was social with peers and staff participating in the intervention.  Kenlyn Lose LRT/CTRS         Zebulen Simonis 05/10/2020 12:03 PM 

## 2020-05-10 NOTE — Progress Notes (Signed)
Patient isolative to room this evening, did not come out for snacks, had to take medications to her. Reports she is ok, but stares. She is medication compliant, but forwards minimal tonight. Pt sleeping off and on during assessment. Minimal interaction.  Encouragement and support provided. Safety checks maintained. Medications given as prescribed,

## 2020-05-10 NOTE — Plan of Care (Signed)
  Problem: [facility] Concurrent Medical Problem Goal: STG-Compliance with medication and/or treatment as ordered Description: (STG-Compliance with medication and/or treatment as ordered by MD) Outcome: Progressing   Problem: Education: Goal: Knowledge of the prescribed therapeutic regimen will improve Outcome: Progressing   Problem: Coping: Goal: Will verbalize feelings Outcome: Not Progressing   Problem: Health Behavior/Discharge Planning: Goal: Compliance with therapeutic regimen will improve Outcome: Progressing   Problem: Role Relationship: Goal: Will demonstrate positive changes in social behaviors and relationships Outcome: Progressing   Problem: Safety: Goal: Ability to disclose and discuss suicidal ideas will improve Outcome: Progressing

## 2020-05-10 NOTE — Plan of Care (Signed)
Patient is more visible in the milieu and engaged in activities.Patient states " I am little anxious because I feel I stuck here." Patient stated that she is going to call her mom after dinner.Denies SI,HI and AVH.Compliant with medications.Attended groups.Appetite and energy level good.Support and encouragement given.

## 2020-05-10 NOTE — Progress Notes (Signed)
Patient pleasant and cooperative. Reports she is feeling and dong better than she has been. Denies any SI, HI, AVH. Does endorse depression. Eating snacks this pm. Slept throughout night, only got up once and walked around.  Encouragement and support provided. Safety checks maintained. Medications given as prescribed. Pt receptive and remains safe on unit with q 15 min checks.

## 2020-05-11 MED ORDER — FLUVOXAMINE MALEATE 50 MG PO TABS
100.0000 mg | ORAL_TABLET | Freq: Every morning | ORAL | Status: DC
  Administered 2020-05-12 – 2020-05-16 (×5): 100 mg via ORAL
  Filled 2020-05-11 (×5): qty 2

## 2020-05-11 MED ORDER — FLUVOXAMINE MALEATE 50 MG PO TABS
150.0000 mg | ORAL_TABLET | Freq: Every day | ORAL | Status: DC
  Administered 2020-05-11 – 2020-05-15 (×5): 150 mg via ORAL
  Filled 2020-05-11 (×5): qty 3

## 2020-05-11 MED ORDER — BENZTROPINE MESYLATE 1 MG PO TABS
4.0000 mg | ORAL_TABLET | Freq: Two times a day (BID) | ORAL | Status: DC
  Administered 2020-05-11 – 2020-05-16 (×10): 4 mg via ORAL
  Filled 2020-05-11 (×10): qty 4

## 2020-05-11 NOTE — [facility] Group Notes (Signed)
.  LCSW Group Therapy Note  05/11/2020 2:11 PM  Type of Therapy and Topic:  Group Therapy: Health relationships Participation Level:  Minimal   Description of Group:   In this group, patients will discuss the difference between healthy and unhealthy relationships. Discuss unhealthy relationships and how to have positive healthy boundaries with those that sabotage and enable. Explore aspects of healthy relationships and how to limit these self-destructive behaviors in everyday life.   Therapeutic Goals: 1. Patient will identify one obstacle that relates to self-sabotage and enabling behaviors 2. Patient will identify one personal self-sabotaging or enabling behavior they did prior to admission 3. Patient will state a plan to change the above identified behavior 4. Patient will demonstrate ability to communicate their needs through discussion and/or role play.   Summary of Patient Progress: Patient was present for the entirety of the group session. Patient was an participated in group introduction, otherwise, patient did not contribute to the topic of discussion.   Therapeutic Modalities:   Cognitive Behavioral Therapy Person-Centered Therapy Motivational Interviewing   Gwenevere Ghazi, MSW, [place], [place] 05/11/2020 2:11 PM

## 2020-05-11 NOTE — Progress Notes (Signed)
[facility] MD Progress Note  05/11/2020 9:40 AM Robin Hess  MRN:  [id number]  Principal Problem: Psychosis (HCC) Diagnosis: Principal Problem:   Psychosis (HCC) Active Problems:   Social anxiety disorder   OCD (obsessive compulsive disorder)  Robin Hess is a  19y.o. female patient who presents to the [facility] unit for treatment of psychosis.  Interval History Patient was seen today for re-evaluation.  Nursing reports no events overnight. Patient has been medication compliant.   Subjective:  Today, patient assessed one-on-one at bedside today.  She states she is feeling anxious today. When asked what is worrying her she states she feels her parent's aren't real. She feels they sound different on the phone, and they aren't really her family. She is also staring inappropriately as if she may be responding to internal stimuli. When asked directly, she denies auditory hallucinations, visual hallucinations, homicidal ideations, and suicidal ideations. Tremor noted in bilateral hands today. Continue Haldol to 10 mg QHS and increase cogentin 4 mg BID. Encouraged patient to contact her family again today.   Shannon Matte contacted at [phone]. She states they had a reasonably good conversation yesterday, but noted that Stephine did seem very depressed and anxious. She did not express any paranoid or bizarre content yesterday. She plans to call her daughter today.   Labs: no new results for review.  Total Time spent with patient: 30 minutes  Past Psychiatric History: Patient has had 2 prior hospitalizations in 2015 and 2019. Last diagnosis was depression and OCD. Did not follow-up with outpatient treatment. Not currently getting any outpatient treatment. Has made suicidal statements in the past. Recently had not been trying to hurt her self or showing any violence to others. Some history of cannabis use in the past but currently does not seem to be an active issue.   Past Medical History:  Past  Medical History:  Diagnosis Date  . Allergy   . Anxiety   . Cannabis use disorder, moderate, dependence (HCC)   . Depression   . OCD (obsessive compulsive disorder)   . Severe major depression, single episode, with psychotic features (HCC)   . Social anxiety disorder    History reviewed. No pertinent surgical history. Family History: History reviewed. No pertinent family history. Family Psychiatric  History: None reported Social History:  Social History   Substance and Sexual Activity  Alcohol Use No     Social History   Substance and Sexual Activity  Drug Use Yes  . Types: Marijuana    Social History   Socioeconomic History  . Marital status: Single    Spouse name: Not on file  . Number of children: Not on file  . Years of education: Not on file  . Highest education level: Not on file  Occupational History  . Not on file  Tobacco Use  . Smoking status: Never Smoker  . Smokeless tobacco: Never Used  Substance and Sexual Activity  . Alcohol use: No  . Drug use: Yes    Types: Marijuana  . Sexual activity: Never    Birth control/protection: None  Other Topics Concern  . Not on file  Social History Narrative  . Not on file   Social Determinants of Health   Financial Resource Strain: Not on file  Food Insecurity: Not on file  Transportation Needs: Not on file  Physical Activity: Not on file  Stress: Not on file  Social Connections: Not on file   Additional Social History:  Sleep: Fair  Appetite:  Fair  Current Medications: Current Facility-Administered Medications  Medication Dose Route Frequency Provider Last Rate Last Admin  . acetaminophen (TYLENOL) tablet 650 mg  650 mg Oral Q6H PRN Clapacs, [name], MD   650 mg at 05/09/20 0809  . alum & mag hydroxide-simeth (MAALOX/MYLANTA) 200-200-20 MG/5ML suspension 30 mL  30 mL Oral Q4H PRN Clapacs, [name], MD      . benztropine (COGENTIN) tablet 3 mg  3 mg Oral BID [name], MD   3 mg at 05/11/20 6761  . chlorproMAZINE (THORAZINE) injection 25 mg  25 mg Intramuscular TID PRN [name], MD      . haloperidol (HALDOL) tablet 5 mg  5 mg Oral Q8H PRN [name], MD   5 mg at 04/29/20 1529   And  . LORazepam (ATIVAN) tablet 2 mg  2 mg Oral Q8H PRN [name], MD   2 mg at 04/27/20 1627   And  . diphenhydrAMINE (BENADRYL) capsule 50 mg  50 mg Oral Q8H PRN [name], MD   50 mg at 04/29/20 1529  . feeding supplement (ENSURE ENLIVE / ENSURE PLUS) liquid 237 mL  237 mL Oral BID BM [name], MD   237 mL at 05/10/20 1352  . fluvoxaMINE (LUVOX) tablet 100 mg  100 mg Oral BID Thalia Party, MD   100 mg at 05/11/20 0814  . haloperidol (HALDOL) tablet 10 mg  10 mg Oral QHS [name], MD   10 mg at 05/10/20 2055  . hydrOXYzine (ATARAX/VISTARIL) tablet 50 mg  50 mg Oral TID PRN Clapacs, [name], MD   50 mg at 05/09/20 0809  . LORazepam (ATIVAN) tablet 2 mg  2 mg Oral Q6H PRN [name], MD   2 mg at 05/11/20 9509   Or  . LORazepam (ATIVAN) injection 2 mg  2 mg Intramuscular Q6H PRN [name], MD   2 mg at 04/26/20 1645  . magnesium hydroxide (MILK OF MAGNESIA) suspension 30 mL  30 mL Oral Daily PRN Clapacs, [name], MD      . sodium chloride (OCEAN) 0.65 % nasal spray 2 spray  2 spray Each Nare Q4H PRN [name], MD        Lab Results: No results found for this or any previous visit (from the past 48 hour(s)).  Blood Alcohol level:  Lab Results  Component Value Date   ETH <10 04/12/2020   ETH <10 03/03/2018    Metabolic Disorder Labs: Lab Results  Component Value Date   HGBA1C 4.7 (L) 04/13/2020   MPG 88.19 04/13/2020   MPG 93.93 03/03/2018   Lab Results  Component Value Date   PROLACTIN 17.5 05/02/2014   Lab Results  Component Value Date   CHOL 122 04/13/2020   TRIG 50 04/13/2020   HDL 48 04/13/2020   CHOLHDL 2.5 04/13/2020   VLDL 10 04/13/2020   LDLCALC 64 04/13/2020   LDLCALC 90 03/03/2018     Physical Findings: AIMS: Facial and Oral Movements Muscles of Facial Expression: None, normal Lips and Perioral Area: None, normal Jaw: None, normal Tongue: None, normal,Extremity Movements Upper (arms, wrists, hands, fingers): None, normal Lower (legs, knees, ankles, toes): None, normal, Trunk Movements Neck, shoulders, hips: None, normal, Overall Severity Severity of abnormal movements (highest score from questions above): None, normal Incapacitation due to abnormal movements: None, normal Patient's awareness of abnormal movements (rate only patient's report): No Awareness,  Dental Status Current problems with teeth and/or dentures?: No  CIWA:  CIWA-Ar Total: 0 COWS:  COWS Total Score: 2  Musculoskeletal: Strength & Muscle Tone: within normal limits Gait & Station: walks on tiptoes Patient leans: N/A  Psychiatric Specialty Exam: Physical Exam Vitals and nursing note reviewed.  Constitutional:      Appearance: Normal appearance.  HENT:     Head: Normocephalic and atraumatic.     Right Ear: External ear normal.     Left Ear: External ear normal.     Nose: Nose normal.     Mouth/Throat:     Mouth: Mucous membranes are moist.     Pharynx: Oropharynx is clear.  Eyes:     Extraocular Movements: Extraocular movements intact.     Conjunctiva/sclera: Conjunctivae normal.     Pupils: Pupils are equal, round, and reactive to light.  Cardiovascular:     Rate and Rhythm: Normal rate.     Pulses: Normal pulses.  Pulmonary:     Effort: Pulmonary effort is normal.     Breath sounds: Normal breath sounds.  Abdominal:     General: Abdomen is flat.     Palpations: Abdomen is soft.  Musculoskeletal:        General: No swelling or tenderness. Normal range of motion.     Cervical back: Normal range of motion. No rigidity or tenderness.  Skin:    General: Skin is warm and dry.  Neurological:     General: No focal deficit present.     Mental Status: She is alert and oriented to  person, place, and time.  Psychiatric:        Attention and Perception: She perceives auditory hallucinations.        Mood and Affect: Mood is anxious. Affect is flat.        Speech: Speech normal.        Behavior: Behavior is cooperative.        Thought Content: Thought content is paranoid and delusional.        Cognition and Memory: Cognition and memory normal.        Judgment: Judgment is impulsive.     Review of Systems  Constitutional: Positive for fatigue. Negative for appetite change.  HENT: Negative for rhinorrhea and sore throat.   Eyes: Negative for photophobia and visual disturbance.  Respiratory: Negative for cough and shortness of breath.   Cardiovascular: Negative for chest pain and palpitations.  Gastrointestinal: Negative for diarrhea, nausea and vomiting.  Endocrine: Negative for cold intolerance and heat intolerance.  Genitourinary: Negative for difficulty urinating and dysuria.  Musculoskeletal: Negative for arthralgias, myalgias, neck pain and neck stiffness.  Skin: Negative for rash and wound.  Allergic/Immunologic: Negative for food allergies and immunocompromised state.  Neurological: Positive for tremors and headaches.  Hematological: Negative for adenopathy.  Psychiatric/Behavioral: Positive for hallucinations. Negative for suicidal ideas. The patient is nervous/anxious.     Blood pressure 96/62, pulse 74, temperature 98.1 F (36.7 C), temperature source Oral, resp. rate 17, height 5\' 11"  (1.803 m), weight 63.5 kg, last menstrual period 03/30/2020, SpO2 98 %.Body mass index is 19.53 kg/m.  General Appearance: Casual  Eye Contact:  Fair  Speech:  Slow  Volume:  Decreased  Mood:  "A little anxious, but better than yesterday"  Affect:  Congruent  Thought Process:  Coherent  Orientation:  Full (Time, Place, and Person)  Thought Content:  Logical  Suicidal Thoughts:  No  Homicidal Thoughts:  No  Memory:  Immediate;  Fair Recent;   Fair Remote;   Fair   Judgement:  Intact  Insight:  limited  Psychomotor Activity:  Restlessness  Concentration:  Concentration: Fair and Attention Span: Fair  Recall:  [employer]Fair  Fund of Knowledge:  Fair  Language:  Fair  Akathisia:  No  Handed:  Right  AIMS (if indicated):     Assets:  Desire for Improvement Housing Social Support  ADL's:  impaired  Cognition:  Impaired,  Mild  Sleep:  Number of Hours: 8.25     Treatment Plan Summary: Daily contact with patient to assess and evaluate symptoms and progress in treatment and Medication management   Patient is a 19 year old female with the above-stated past psychiatric history who is seen in follow-up.  Chart reviewed. Patient discussed with nursing. Patient continues to express psychotic symptoms, depression, anxiety. Symptoms did not improve with Seroquel up to 450 mg daily, or with Risperdal up to 4 mg daily. Both have been tapered and discontinued  Plan: -continue inpatient psych admission; 15-minute checks; daily contact with patient to assess and evaluate symptoms and progress in treatment; psychoeducation. Medications: -Continue Haldol to 10 mg QHS for psychosis increase cogentin 4 mg BID for EPS. -Increase Luvox to 100mg  in the monring, 150 mg QHS for depression, anxiety and OCD. -Continue  Haldol/Ativan/Bendaryl PRN for acute agitation.  -Pertinent Labs: no new labs ordered today -EKG: on 12/12 QTc 428 -Consults: No new consults placed since yesterday  -Disposition: patient is not ready for d/c at this time yet. - I certify that the patient does need, on a daily basis, active treatment furnished directly by or requiring the supervision of inpatient psychiatric facility personnel.   Jesse SansMegan M Jamire Shabazz, MD 05/11/2020, 9:40 AM

## 2020-05-11 NOTE — [facility] Group Notes (Signed)
[facility] Group Notes:  (Nursing/MHT/Case Management/Adjunct)  Date:  05/11/2020  Time:  9:43 PM  Type of Therapy:  Wrap up Group  Participation Level:  Did Not Attend   Mayra Neer 05/11/2020, 9:43 PM

## 2020-05-11 NOTE — Progress Notes (Signed)
Recreation Therapy Notes  Date: 05/11/2020  Time: 9:30 am   Location: Craft room  Behavioral response: Appropriate  Intervention Topic: Communication  Discussion/Intervention:  Group content today was focused on communication. The group defined communication and ways to communicate with others. Individuals stated reason why communication is important and some reasons to communicate with others. Patients expressed if they thought they were good at communicating with others and ways they could improve their communication skills. The group identified important parts of communication and some experiences they have had in the past with communication. The group participated in the intervention "What is that?", where they had a chance to test out their communication skills and identify ways to improve their communication techniques.  Clinical Observations/Feedback: Patient came to group and was focused on what peers and staff had to say about communication. Individual was social with peers and staff participating in the intervention.  Elzie Sheets LRT/CTRS         Chelsey Redondo 05/11/2020 11:56 AM

## 2020-05-11 NOTE — Progress Notes (Signed)
BRIEF PHARMACY NOTE   This patient attended and participated in Medication Management Group counseling led by [facility] staff pharmacist.  This interactive class reviews basic information about prescription medications and education on personal responsibility in medication management.  The class also includes general knowledge of 3 main classes of behavioral medications, including antipsychotics, antidepressants, and mood stabilizers.     Patient behavior was appropriate for group setting.   Educational materials sourced from:  "Medication Do's and Don'ts" from [email or web address]   "Mental Health Medications" from [facility] FaxRack.[email or web address].shtml#[email or web address]    Albina Billet, PharmD, BCPS Clinical Pharmacist 05/11/2020 7:28 AM

## 2020-05-11 NOTE — Plan of Care (Signed)
D- Patient alert and oriented. Patient presented in a sad, but pleasant mood on assessment stating that she slept ok last night and had some complaints of "limb pain", rating her pain a "4/10", but she did not request any PRN medication from this Clinical research associate. Patient endorsed both depression and anxiety, stating "I just don't like being here". Patient denied SI, HI, AVH at this time. Patient's goal for today is to "go to groups", in which she will "not oversleep", in order to achieve her goal. Patient has attended and participated in unit activities today, without any issues. Patient has also been present in the milieu more so today, than any other day this week, per this writer's observation.  A- Scheduled medications administered to patient, per MD orders. Support and encouragement provided.  Routine safety checks conducted every 15 minutes.  Patient informed to notify staff with problems or concerns.  R- No adverse drug reactions noted. Patient contracts for safety at this time. Patient compliant with medications and treatment plan. Patient receptive, calm, and cooperative. Patient interacts well with others on the unit.  Patient remains safe at this time.  Problem: Consults Goal: Concurrent Medical Patient Education Description: (See Patient Education Module for education specifics) Outcome: Progressing   Problem: [facility] Concurrent Medical Problem Goal: LTG-Pt will be physically stable and he/significant other Description: (Patient will be physically stable and he/significant other will be able to verbalize understanding of follow-up care and symptoms that would warrant further treatment) Outcome: Progressing Goal: STG-Vital signs will be within defined limits or stabilized Description: (STG- Vital signs will be within defined limits or stabilized for individual) Outcome: Progressing Goal: STG-Compliance with medication and/or treatment as ordered Description: (STG-Compliance with medication and/or  treatment as ordered by MD) Outcome: Progressing Goal: STG-Verbalize two symptoms that would warrant further Description: (STG-Verbalize two symptoms that would warrant further treatment) Outcome: Progressing Goal: STG-Patient will participate in management/stabilization Description: (STG-Patient will participate in management/stabilization of medical condition) Outcome: Progressing Goal: STG-Other (Specify): Description: STG-Other Concurrent Medical (Specify): Outcome: Progressing   Problem: Education: Goal: Utilization of techniques to improve thought processes will improve Outcome: Progressing Goal: Knowledge of the prescribed therapeutic regimen will improve Outcome: Progressing   Problem: Activity: Goal: Interest or engagement in leisure activities will improve Outcome: Progressing Goal: Imbalance in normal sleep/wake cycle will improve Outcome: Progressing   Problem: Coping: Goal: Coping ability will improve Outcome: Progressing Goal: Will verbalize feelings Outcome: Progressing   Problem: Health Behavior/Discharge Planning: Goal: Ability to make decisions will improve Outcome: Progressing Goal: Compliance with therapeutic regimen will improve Outcome: Progressing   Problem: Role Relationship: Goal: Will demonstrate positive changes in social behaviors and relationships Outcome: Progressing   Problem: Safety: Goal: Ability to disclose and discuss suicidal ideas will improve Outcome: Progressing Goal: Ability to identify and utilize support systems that promote safety will improve Outcome: Progressing   Problem: Self-Concept: Goal: Will verbalize positive feelings about self Outcome: Progressing Goal: Level of anxiety will decrease Outcome: Progressing   Problem: Education: Goal: Knowledge of [facility] Education information/materials will improve Outcome: Progressing Goal: Emotional status will improve Outcome: Progressing Goal: Mental status  will improve Outcome: Progressing Goal: Verbalization of understanding the information provided will improve Outcome: Progressing

## 2020-05-12 NOTE — Plan of Care (Signed)
  Problem: Safety: Goal: Ability to disclose and discuss suicidal ideas will improve Outcome: Progressing Goal: Ability to identify and utilize support systems that promote safety will improve Outcome: Progressing   

## 2020-05-12 NOTE — Progress Notes (Signed)
Patient presents with flat affect, smiles occasionally. No concerns or complaints voiced. Medication compliant. Appropriate with staff and peers although minimal interaction.  Encouragement and support provided. Safety checks maintained. Medications given as prescribed. Pt receptive and remains safe on unit with q 15 min checks.

## 2020-05-12 NOTE — Tx Team (Signed)
Interdisciplinary Treatment and Diagnostic Plan Update  05/12/2020 Time of Session: 10:30 AM  Robin Hess MRN: [id number]  Principal Diagnosis: Psychosis [facility])  Secondary Diagnoses: Principal Problem:   Psychosis (HCC) Active Problems:   Social anxiety disorder   OCD (obsessive compulsive disorder)   Current Medications:  Current Facility-Administered Medications  Medication Dose Route Frequency Provider Last Rate Last Admin  . acetaminophen (TYLENOL) tablet 650 mg  650 mg Oral Q6H PRN Clapacs, [name], MD   650 mg at 05/09/20 0809  . alum & mag hydroxide-simeth (MAALOX/MYLANTA) 200-200-20 MG/5ML suspension 30 mL  30 mL Oral Q4H PRN Clapacs, [name], MD      . benztropine (COGENTIN) tablet 4 mg  4 mg Oral BID [name], MD   4 mg at 05/12/20 0757  . chlorproMAZINE (THORAZINE) injection 25 mg  25 mg Intramuscular TID PRN [name], MD      . haloperidol (HALDOL) tablet 5 mg  5 mg Oral Q8H PRN [name], MD   5 mg at 04/29/20 1529   And  . LORazepam (ATIVAN) tablet 2 mg  2 mg Oral Q8H PRN [name], MD   2 mg at 04/27/20 1627   And  . diphenhydrAMINE (BENADRYL) capsule 50 mg  50 mg Oral Q8H PRN [name], MD   50 mg at 04/29/20 1529  . feeding supplement (ENSURE ENLIVE / ENSURE PLUS) liquid 237 mL  237 mL Oral BID BM [name], MD   237 mL at 05/11/20 1652  . fluvoxaMINE (LUVOX) tablet 100 mg  100 mg Oral q AM [name], MD   100 mg at 05/12/20 0757  . fluvoxaMINE (LUVOX) tablet 150 mg  150 mg Oral QHS [name], MD   150 mg at 05/11/20 2123  . haloperidol (HALDOL) tablet 10 mg  10 mg Oral QHS [name], MD   10 mg at 05/11/20 2123  . hydrOXYzine (ATARAX/VISTARIL) tablet 50 mg  50 mg Oral TID PRN Clapacs, [name], MD   50 mg at 05/09/20 0809  . LORazepam (ATIVAN) tablet 2 mg  2 mg Oral Q6H PRN [name], MD   2 mg at 05/11/20 7322   Or  . LORazepam (ATIVAN) injection 2 mg  2 mg Intramuscular Q6H PRN [name],  MD   2 mg at 04/26/20 1645  . magnesium hydroxide (MILK OF MAGNESIA) suspension 30 mL  30 mL Oral Daily PRN Clapacs, [name], MD      . sodium chloride (OCEAN) 0.65 % nasal spray 2 spray  2 spray Each Nare Q4H PRN [name], MD       PTA Medications: Medications Prior to Admission  Medication Sig Dispense Refill Last Dose  . fluvoxaMINE (LUVOX) 100 MG tablet Take 1 tablet (100 mg total) by mouth at bedtime. (Patient not taking: Reported on 04/12/2020) 30 tablet 1   . hydrOXYzine (ATARAX/VISTARIL) 50 MG tablet Take 1 tablet (50 mg total) by mouth 3 (three) times daily as needed for anxiety. (Patient not taking: Reported on 04/12/2020) 90 tablet 1   . lamoTRIgine (LAMICTAL) 25 MG tablet Take 1 tablet (25 mg total) by mouth at bedtime. Take 1 tab for two weeks, 2 tabs for 2 weeks, 4 tabs for 2 weeks, 8 tabs or 200 mg after (Patient not taking: Reported on 04/12/2020) 45 tablet 1   . prazosin (MINIPRESS) 1 MG capsule Take 1 capsule (1 mg total) by mouth 2 (  two) times daily. (Patient not taking: Reported on 04/12/2020) 60 capsule 1   . traZODone (DESYREL) 100 MG tablet Take 1 tablet (100 mg total) by mouth at bedtime as needed for sleep. (Patient not taking: Reported on 04/12/2020) 30 tablet 1     Patient Stressors:    Patient Strengths:    Treatment Modalities: Medication Management, Group therapy, Case management,  1 to 1 session with clinician, Psychoeducation, Recreational therapy.   Physician Treatment Plan for Primary Diagnosis: Psychosis (HCC) Long Term Goal(s): Improvement in symptoms so as ready for discharge Improvement in symptoms so as ready for discharge   Short Term Goals: Ability to identify changes in lifestyle to reduce recurrence of condition will improve Ability to verbalize feelings will improve Ability to disclose and discuss suicidal ideas Ability to demonstrate self-control will improve Ability to identify and develop effective coping behaviors will  improve Compliance with prescribed medications will improve Ability to identify changes in lifestyle to reduce recurrence of condition will improve Ability to verbalize feelings will improve Ability to disclose and discuss suicidal ideas Ability to demonstrate self-control will improve Ability to identify and develop effective coping behaviors will improve Compliance with prescribed medications will improve  Medication Management: Evaluate patient's response, side effects, and tolerance of medication regimen.  Therapeutic Interventions: 1 to 1 sessions, Unit Group sessions and Medication administration.  Evaluation of Outcomes: Not Progressing  Physician Treatment Plan for Secondary Diagnosis: Principal Problem:   Psychosis (HCC) Active Problems:   Social anxiety disorder   OCD (obsessive compulsive disorder)  Long Term Goal(s): Improvement in symptoms so as ready for discharge Improvement in symptoms so as ready for discharge   Short Term Goals: Ability to identify changes in lifestyle to reduce recurrence of condition will improve Ability to verbalize feelings will improve Ability to disclose and discuss suicidal ideas Ability to demonstrate self-control will improve Ability to identify and develop effective coping behaviors will improve Compliance with prescribed medications will improve Ability to identify changes in lifestyle to reduce recurrence of condition will improve Ability to verbalize feelings will improve Ability to disclose and discuss suicidal ideas Ability to demonstrate self-control will improve Ability to identify and develop effective coping behaviors will improve Compliance with prescribed medications will improve     Medication Management: Evaluate patient's response, side effects, and tolerance of medication regimen.  Therapeutic Interventions: 1 to 1 sessions, Unit Group sessions and Medication administration.  Evaluation of Outcomes: Not  Progressing   RN Treatment Plan for Primary Diagnosis: Psychosis (HCC) Long Term Goal(s): Knowledge of disease and therapeutic regimen to maintain health will improve  Short Term Goals: Ability to verbalize frustration and anger appropriately will improve, Ability to participate in decision making will improve, Ability to verbalize feelings will improve, Ability to identify and develop effective coping behaviors will improve and Compliance with prescribed medications will improve  Medication Management: RN will administer medications as ordered by provider, will assess and evaluate patient's response and provide education to patient for prescribed medication. RN will report any adverse and/or side effects to prescribing provider.  Therapeutic Interventions: 1 on 1 counseling sessions, Psychoeducation, Medication administration, Evaluate responses to treatment, Monitor vital signs and CBGs as ordered, Perform/monitor CIWA, COWS, AIMS and Fall Risk screenings as ordered, Perform wound care treatments as ordered.  Evaluation of Outcomes: Not Progressing   LCSW Treatment Plan for Primary Diagnosis: Psychosis (HCC) Long Term Goal(s): Safe transition to appropriate next level of care at discharge, Engage patient in therapeutic group addressing interpersonal concerns.  Short Term Goals: Engage patient in aftercare planning with referrals and resources, Increase social support, Increase ability to appropriately verbalize feelings, Facilitate acceptance of mental health diagnosis and concerns, Identify triggers associated with mental health/substance abuse issues and Increase skills for wellness and recovery  Therapeutic Interventions: Assess for all discharge needs, 1 to 1 time with Social worker, Explore available resources and support systems, Assess for adequacy in community support network, Educate family and significant other(s) on suicide prevention, Complete Psychosocial Assessment, Interpersonal  group therapy.  Evaluation of Outcomes: Not Progressing   Progress in Treatment: Attending groups: Yes. Attends some groups Participating in groups: No. Little participation unless spoken too Taking medication as prescribed: Yes. Toleration medication: Yes. Family/Significant other contact made: Yes, individual(s) contacted:  Father, Leafy Motsinger Patient understands diagnosis: No. Discussing patient identified problems/goals with staff: No. Medical problems stabilized or resolved: Yes. Denies suicidal/homicidal ideation: No. Issues/concerns per patient self-inventory: No. Other: None  New problem(s) identified: No, Describe:  None   New Short Term/Long Term Goal(s): Elimination of symptoms of psychosis, medication management for mood stabilization; elimination of SI thoughts; development of comprehensive mental wellness/sobriety plan. Update (04/18/20) Patient was able to have brief (30 minutes) conversation with physician.Update 04/23/20: No changes at this time. Update 04/28/2020: No changes at this time Update 05/03/20: No changes at this time. Update 05/08/20: No changes at this time. Update 05/12/20: No changes at this time.   Patient Goals: Work on myself." "Update (04/18/20) nothing significant to report.Update 04/23/20: No changes at this time. Update 04/28/20: No changes at this time Update 05/03/20: No changes at this time. Update 05/08/20: No changes at this time. Update 05/12/20: No changes at this time.   Discharge Plan or Barriers: CSW will assist with aftercare planning as needed.Update 04/23/20: No changes at this time. Update 04/28/2020: No changes at this time Update 05/03/20: No changes at this time. Update 05/08/20: No changes at this time. Update 05/12/20: No changes at this time.   Reason for Continuation of Hospitalization: Anxiety Delusions  Depression Hallucinations Medication stabilization  Estimated Length of Stay: TBD  Attendees: Patient: 05/12/2020 11:21  AM  Physician: Dr. Reita May, MD 05/12/2020 11:21 AM  Nursing:  05/12/2020 11:21 AM  RN Care Manager: 05/12/2020 11:21 AM  Social Worker: Susa Simmonds, LCSWA 05/12/2020 11:21 AM  Recreational Therapist:  05/12/2020 11:21 AM  Other:  05/12/2020 11:21 AM  Other:  05/12/2020 11:21 AM  Other: 05/12/2020 11:21 AM    Scribe for Treatment Team: Susa Simmonds, LCSWA 05/12/2020 11:21 AM

## 2020-05-12 NOTE — [facility] Group Notes (Signed)
  [facility] LCSW Group Therapy Note  Date/Time:  05/12/2020 1:05 PM- 1:48 PM  Type of Therapy and Topic:  Group Therapy:  Feelings About Hospitalization  Participation Level:  Minimal   Description of Group This process group involved patients discussing their feelings related to being hospitalized, as well as the benefits they see to being in the hospital.  These feelings and benefits were itemized.  The group then brainstormed specific ways in which they could seek those same benefits when they discharge and return home.  Therapeutic Goals 1. Patient will identify and describe positive and negative feelings related to hospitalization 2. Patient will verbalize benefits of hospitalization to themselves personally 3. Patients will brainstorm together ways they can obtain similar benefits in the outpatient setting, identify barriers to wellness and possible solutions  Summary of Patient Progress: Patient checked into group doing okay. Patient did not participate in group unless you asked her a questions. CSW would ask patient questions and patient would say she did not know.     Therapeutic Modalities Cognitive Behavioral Therapy Motivational Interviewing    Susa Simmonds, [place] 05/12/2020  4:17 PM

## 2020-05-12 NOTE — Progress Notes (Signed)
Pt is alert and oriented to person, place, time and situation. Pt is calm, cooperative, denies suicidal and homicidal ideation, denies hallucinations. Pt is medication complaint, thoughts are logical, affect is bright, pt is pleasant, smiles on contact, voices no complaints. Will continue to monitor pt per Q15 minute face checks and monitor for safety and progress.

## 2020-05-12 NOTE — Progress Notes (Signed)
[facility] MD Progress Note  05/12/2020 8:22 AM [name]  MRN:  [id number]  Principal Problem: Psychosis (HCC) Diagnosis: Principal Problem:   Psychosis (HCC) Active Problems:   Social anxiety disorder   OCD (obsessive compulsive disorder)  [name] is a  19y.o. female patient who presents to the [facility] unit for treatment of psychosis.  Today: Patient is seen and the office.  She expresses herself fairly well, better than when she was last seen by this provider last month.  She subjectively feels that her Haldol is working better than her other medications.  Reports that she is starting to go to groups now.  She reports "some" anxiety and depression that are moderate in intensity.  No thoughts of suicide no homicidal thoughts.  She reflects, "I should have listened to my mother, and I feel like that is why I am here.  She told me to calm down but I ran through the back door because I thought someone was after me."  In hindsight she feels that this was not true.  In terms of auditory hallucinations, she feels that music will start playing out of nowhere.  Also, she will hear voices as well 1-2 times a day telling her that she is a "horrible person."  The intensity of these voices are moderate. She is unsure if her family is real.   Patient indicates that she slept well last night.  Tremors are mild.   Interval History Patient was seen today for re-evaluation.  Nursing reports no events overnight. Patient has been medication compliant.   Subjective:    Labs: no new results for review.  Total Time spent with patient: 30 minutes  Past Psychiatric History: Patient has had 2 prior hospitalizations in 2015 and 2019. Last diagnosis was depression and OCD. Did not follow-up with outpatient treatment. Not currently getting any outpatient treatment. Has made suicidal statements in the past. Recently had not been trying to hurt her self or showing any violence to others. Some history of  cannabis use in the past but currently does not seem to be an active issue.   Past Medical History:  Past Medical History:  Diagnosis Date  . Allergy   . Anxiety   . Cannabis use disorder, moderate, dependence (HCC)   . Depression   . OCD (obsessive compulsive disorder)   . Severe major depression, single episode, with psychotic features (HCC)   . Social anxiety disorder    History reviewed. No pertinent surgical history. Family History: History reviewed. No pertinent family history. Family Psychiatric  History: None reported Social History:  Social History   Substance and Sexual Activity  Alcohol Use No     Social History   Substance and Sexual Activity  Drug Use Yes  . Types: Marijuana    Social History   Socioeconomic History  . Marital status: Single    Spouse name: Not on file  . Number of children: Not on file  . Years of education: Not on file  . Highest education level: Not on file  Occupational History  . Not on file  Tobacco Use  . Smoking status: Never Smoker  . Smokeless tobacco: Never Used  Substance and Sexual Activity  . Alcohol use: No  . Drug use: Yes    Types: Marijuana  . Sexual activity: Never    Birth control/protection: None  Other Topics Concern  . Not on file  Social History Narrative  . Not on file   Social Determinants of Health  Financial Resource Strain: Not on file  Food Insecurity: Not on file  Transportation Needs: Not on file  Physical Activity: Not on file  Stress: Not on file  Social Connections: Not on file   Additional Social History:                         Sleep: Fair  Appetite:  Fair  Current Medications: Current Facility-Administered Medications  Medication Dose Route Frequency Provider Last Rate Last Admin  . acetaminophen (TYLENOL) tablet 650 mg  650 mg Oral Q6H PRN Clapacs, Jackquline DenmarkJohn T, MD   650 mg at 05/09/20 0809  . alum & mag hydroxide-simeth (MAALOX/MYLANTA) 200-200-20 MG/5ML suspension 30 mL   30 mL Oral Q4H PRN Clapacs, [name], MD      . benztropine (COGENTIN) tablet 4 mg  4 mg Oral BID Jesse SansFreeman, Megan M, MD   4 mg at 05/12/20 0757  . chlorproMAZINE (THORAZINE) injection 25 mg  25 mg Intramuscular TID PRN Jesse SansFreeman, Megan M, MD      . haloperidol (HALDOL) tablet 5 mg  5 mg Oral Q8H PRN Jesse SansFreeman, Megan M, MD   5 mg at 04/29/20 1529   And  . LORazepam (ATIVAN) tablet 2 mg  2 mg Oral Q8H PRN Jesse SansFreeman, Megan M, MD   2 mg at 04/27/20 1627   And  . diphenhydrAMINE (BENADRYL) capsule 50 mg  50 mg Oral Q8H PRN Jesse SansFreeman, Megan M, MD   50 mg at 04/29/20 1529  . feeding supplement (ENSURE ENLIVE / ENSURE PLUS) liquid 237 mL  237 mL Oral BID BM Jesse SansFreeman, Megan M, MD   237 mL at 05/11/20 1652  . fluvoxaMINE (LUVOX) tablet 100 mg  100 mg Oral q AM Jesse SansFreeman, Megan M, MD   100 mg at 05/12/20 0757  . fluvoxaMINE (LUVOX) tablet 150 mg  150 mg Oral QHS Jesse SansFreeman, Megan M, MD   150 mg at 05/11/20 2123  . haloperidol (HALDOL) tablet 10 mg  10 mg Oral QHS Jesse SansFreeman, Megan M, MD   10 mg at 05/11/20 2123  . hydrOXYzine (ATARAX/VISTARIL) tablet 50 mg  50 mg Oral TID PRN Clapacs, Jackquline DenmarkJohn T, MD   50 mg at 05/09/20 0809  . LORazepam (ATIVAN) tablet 2 mg  2 mg Oral Q6H PRN Jesse SansFreeman, Megan M, MD   2 mg at 05/11/20 16100814   Or  . LORazepam (ATIVAN) injection 2 mg  2 mg Intramuscular Q6H PRN Jesse SansFreeman, Megan M, MD   2 mg at 04/26/20 1645  . magnesium hydroxide (MILK OF MAGNESIA) suspension 30 mL  30 mL Oral Daily PRN Clapacs, [name], MD      . sodium chloride (OCEAN) 0.65 % nasal spray 2 spray  2 spray Each Nare Q4H PRN Jesse SansFreeman, Megan M, MD        Lab Results: No results found for this or any previous visit (from the past 48 hour(s)).  Blood Alcohol level:  Lab Results  Component Value Date   ETH <10 04/12/2020   ETH <10 03/03/2018    Metabolic Disorder Labs: Lab Results  Component Value Date   HGBA1C 4.7 (L) 04/13/2020   MPG 88.19 04/13/2020   MPG 93.93 03/03/2018   Lab Results  Component Value Date   PROLACTIN 17.5  05/02/2014   Lab Results  Component Value Date   CHOL 122 04/13/2020   TRIG 50 04/13/2020   HDL 48 04/13/2020   CHOLHDL 2.5 04/13/2020   VLDL 10 04/13/2020  LDLCALC 64 04/13/2020   LDLCALC 90 03/03/2018    Physical Findings: AIMS: Facial and Oral Movements Muscles of Facial Expression: None, normal Lips and Perioral Area: None, normal Jaw: None, normal Tongue: None, normal,Extremity Movements Upper (arms, wrists, hands, fingers): None, normal Lower (legs, knees, ankles, toes): None, normal, Trunk Movements Neck, shoulders, hips: None, normal, Overall Severity Severity of abnormal movements (highest score from questions above): None, normal Incapacitation due to abnormal movements: None, normal Patient's awareness of abnormal movements (rate only patient's report): No Awareness, Dental Status Current problems with teeth and/or dentures?: No  CIWA:  CIWA-Ar Total: 0 COWS:  COWS Total Score: 2  Musculoskeletal: Strength & Muscle Tone: within normal limits Gait & Station: walks on tiptoes Patient leans: N/A  Psychiatric Specialty Exam: Physical Exam Vitals and nursing note reviewed.  Constitutional:      Appearance: Normal appearance.  HENT:     Head: Normocephalic and atraumatic.     Right Ear: External ear normal.     Left Ear: External ear normal.     Nose: Nose normal.     Mouth/Throat:     Mouth: Mucous membranes are moist.     Pharynx: Oropharynx is clear.  Eyes:     Extraocular Movements: Extraocular movements intact.     Conjunctiva/sclera: Conjunctivae normal.     Pupils: Pupils are equal, round, and reactive to light.  Cardiovascular:     Rate and Rhythm: Normal rate.     Pulses: Normal pulses.  Pulmonary:     Effort: Pulmonary effort is normal.     Breath sounds: Normal breath sounds.  Abdominal:     General: Abdomen is flat.     Palpations: Abdomen is soft.  Musculoskeletal:        General: No swelling or tenderness. Normal range of motion.      Cervical back: Normal range of motion. No rigidity or tenderness.  Skin:    General: Skin is warm and dry.  Neurological:     General: No focal deficit present.     Mental Status: She is alert and oriented to person, place, and time.  Psychiatric:        Attention and Perception: She perceives auditory hallucinations.        Mood and Affect: Mood is anxious. Affect is flat.        Speech: Speech normal.        Behavior: Behavior is cooperative.        Thought Content: Thought content is paranoid and delusional.        Cognition and Memory: Cognition and memory normal.        Judgment: Judgment is impulsive.       Blood pressure 101/63, pulse 83, temperature 98.2 F (36.8 C), temperature source Oral, resp. rate 18, height 5\' 11"  (1.803 m), weight 63.5 kg, last menstrual period 03/30/2020, SpO2 97 %.Body mass index is 19.53 kg/m.  General Appearance: Casual  Eye Contact:  Fair  Speech:  Slow  Volume:  Decreased  Mood: anxious  Affect:  Congruent  Thought Process:  Coherent  Orientation:  Full (Time, Place, and Person)  Thought Content:  Logical  Suicidal Thoughts:  No  Homicidal Thoughts:  No  Memory:  Immediate;   Fair Recent;   Fair Remote;   Fair  Judgement:  Intact  Insight:  limited  Psychomotor Activity:  Restlessness  Concentration:  Concentration: Fair and Attention Span: Fair  Recall:  13/09/2019 of Knowledge:  Fair  Language:  Fair  Akathisia:  No  Handed:  Right  AIMS (if indicated):     Assets:  Desire for Improvement Housing Social Support  ADL's:  impaired  Cognition:  Impaired,  Mild  Sleep:  Number of Hours: 8     Treatment Plan Summary: Daily contact with patient to assess and evaluate symptoms and progress in treatment and Medication management   Patient is a 19 year old female with the above-stated past psychiatric history who is seen in follow-up.  Chart reviewed. Patient discussed with nursing. Patient continues to express psychotic symptoms,  depression, anxiety. Symptoms did not improve with Seroquel up to 450 mg daily, or with Risperdal up to 4 mg daily. Both have been tapered and discontinued  Plan: -continue inpatient psych admission; 15-minute checks; daily contact with patient to assess and evaluate symptoms and progress in treatment; psychoeducation. Medications: -Continue Haldol to 10 mg QHS for psychosis increase cogentin 4 mg BID for EPS. -Increase Luvox to 100mg  in the monring, 150 mg QHS for depression, anxiety and OCD. -Continue  Haldol/Ativan/Bendaryl PRN for acute agitation.  -Pertinent Labs: no new labs ordered today -EKG: on 12/12 QTc 428 -Consults: No new consults placed since yesterday  -Disposition: patient is not ready for d/c at this time yet. - I certify that the patient does need, on a daily basis, active treatment furnished directly by or requiring the supervision of inpatient psychiatric facility personnel.   12/18 No changes  1/19, MD 05/12/2020, 8:22 AM

## 2020-05-13 NOTE — [facility] Group Notes (Signed)
LCSW Group Therapy Note  05/13/2020   1:35 PM-2:23 PM   Type of Therapy and Topic:  Group Therapy: Anger Cues and Responses  Participation Level:  None   Description of Group:   In this group, patients learned how to recognize the physical, cognitive, emotional, and behavioral responses they have to anger-provoking situations.  They identified a recent time they became angry and how they reacted.  They analyzed how their reaction was possibly beneficial and how it was possibly unhelpful.  The group discussed a variety of healthier coping skills that could help with such a situation in the future.  Focus was placed on how helpful it is to recognize the underlying emotions to our anger, because working on those can lead to a more permanent solution as well as our ability to focus on the important rather than the urgent.  Therapeutic Goals: 1. Patients will remember their last incident of anger and how they felt emotionally and physically, what their thoughts were at the time, and how they behaved. 2. Patients will identify how their behavior at that time worked for them, as well as how it worked against them. 3. Patients will explore possible new behaviors to use in future anger situations. 4. Patients will learn that anger itself is normal and cannot be eliminated, and that healthier reactions can assist with resolving conflict rather than worsening situations.  Summary of Patient Progress: Patient did not speak during group.  Therapeutic Modalities:   Cognitive Behavioral Therapy    Susa Simmonds, LCSWA 05/13/2020  3:24 PM

## 2020-05-13 NOTE — Progress Notes (Signed)
[facility] MD Progress Note  05/13/2020 9:32 AM Robin Hess  MRN:  [id number]  Principal Problem: Psychosis (HCC) Diagnosis: Principal Problem:   Psychosis (HCC) Active Problems:   Social anxiety disorder   OCD (obsessive compulsive disorder)  Robin Hess is a  19y.o. female patient who presents to the [facility] unit for treatment of psychosis. 12/19 Robin Hess was seen in her room today.  She is brighter.  According to staff and nursing, she is more visible on the unit.  I am also told that she is attending groups and she confirms the same.  "I have learned to be more positive," she remarks when asked if this there is anything that she is learned from them.  So far today, she denies having any auditory hallucinations.  Still has a fixed delusion that the world is gone to end but is able to manage this without it affecting her day necessarily.  He did acknowledge that "people were yelling at her" in regards to hallucinations, telling her "I should just die."  They were moderate in intensity.  She feels mildly depressed but no suicidal thoughts.  No aggression, anger, or homicidal ideations. Tolerating her medications well.  Still reports mild tremor.   12/18 Today: Patient is seen and the office.  She expresses herself fairly well, better than when she was last seen by this provider last month.  She subjectively feels that her Haldol is working better than her other medications.  Reports that she is starting to go to groups now.  She reports "some" anxiety and depression that are moderate in intensity.  No thoughts of suicide no homicidal thoughts.  She reflects, "I should have listened to my mother, and I feel like that is why I am here.  She told me to calm down but I ran through the back door because I thought someone was after me."  In hindsight she feels that this was not true.  In terms of auditory hallucinations, she feels that music will start playing out of nowhere.  Also, she will hear voices as well  1-2 times a day telling her that she is a "horrible person."  The intensity of these voices are moderate. She is unsure if her family is real.   Patient indicates that she slept well last night.  Tremors are mild.   Interval History Patient was seen today for re-evaluation.  Nursing reports no events overnight. Patient has been medication compliant.   Subjective:    Labs: no new results for review.  Total Time spent with patient: 30 minutes  Past Psychiatric History: Patient has had 2 prior hospitalizations in 2015 and 2019. Last diagnosis was depression and OCD. Did not follow-up with outpatient treatment. Not currently getting any outpatient treatment. Has made suicidal statements in the past. Recently had not been trying to hurt her self or showing any violence to others. Some history of cannabis use in the past but currently does not seem to be an active issue.   Past Medical History:  Past Medical History:  Diagnosis Date  . Allergy   . Anxiety   . Cannabis use disorder, moderate, dependence (HCC)   . Depression   . OCD (obsessive compulsive disorder)   . Severe major depression, single episode, with psychotic features (HCC)   . Social anxiety disorder    History reviewed. No pertinent surgical history. Family History: History reviewed. No pertinent family history. Family Psychiatric  History: None reported Social History:  Social History   Substance and Sexual  Activity  Alcohol Use No     Social History   Substance and Sexual Activity  Drug Use Yes  . Types: Marijuana    Social History   Socioeconomic History  . Marital status: Single    Spouse name: Not on file  . Number of children: Not on file  . Years of education: Not on file  . Highest education level: Not on file  Occupational History  . Not on file  Tobacco Use  . Smoking status: Never Smoker  . Smokeless tobacco: Never Used  Substance and Sexual Activity  . Alcohol use: No  . Drug use:  Yes    Types: Marijuana  . Sexual activity: Never    Birth control/protection: None  Other Topics Concern  . Not on file  Social History Narrative  . Not on file   Social Determinants of Health   Financial Resource Strain: Not on file  Food Insecurity: Not on file  Transportation Needs: Not on file  Physical Activity: Not on file  Stress: Not on file  Social Connections: Not on file   Additional Social History:                         Sleep: Fair  Appetite:  Fair  Current Medications: Current Facility-Administered Medications  Medication Dose Route Frequency Provider Last Rate Last Admin  . acetaminophen (TYLENOL) tablet 650 mg  650 mg Oral Q6H PRN Clapacs, [name], MD   650 mg at 05/09/20 0809  . alum & mag hydroxide-simeth (MAALOX/MYLANTA) 200-200-20 MG/5ML suspension 30 mL  30 mL Oral Q4H PRN Clapacs, [name], MD      . benztropine (COGENTIN) tablet 4 mg  4 mg Oral BID [name], MD   4 mg at 05/13/20 0818  . chlorproMAZINE (THORAZINE) injection 25 mg  25 mg Intramuscular TID PRN [name], MD      . haloperidol (HALDOL) tablet 5 mg  5 mg Oral Q8H PRN [name], MD   5 mg at 04/29/20 1529   And  . LORazepam (ATIVAN) tablet 2 mg  2 mg Oral Q8H PRN [name], MD   2 mg at 04/27/20 1627   And  . diphenhydrAMINE (BENADRYL) capsule 50 mg  50 mg Oral Q8H PRN [name], MD   50 mg at 04/29/20 1529  . feeding supplement (ENSURE ENLIVE / ENSURE PLUS) liquid 237 mL  237 mL Oral BID BM [name], MD   237 mL at 05/11/20 1652  . fluvoxaMINE (LUVOX) tablet 100 mg  100 mg Oral q AM [name], MD   100 mg at 05/13/20 0818  . fluvoxaMINE (LUVOX) tablet 150 mg  150 mg Oral QHS [name], MD   150 mg at 05/12/20 2001  . haloperidol (HALDOL) tablet 10 mg  10 mg Oral QHS [name], MD   10 mg at 05/12/20 2000  . hydrOXYzine (ATARAX/VISTARIL) tablet 50 mg  50 mg Oral TID PRN Clapacs, [name], MD   50 mg at 05/09/20 0809  .  LORazepam (ATIVAN) tablet 2 mg  2 mg Oral Q6H PRN [name], MD   2 mg at 05/11/20 9629   Or  . LORazepam (ATIVAN) injection 2 mg  2 mg Intramuscular Q6H PRN [name], MD   2 mg at 04/26/20 1645  . magnesium hydroxide (MILK OF MAGNESIA) suspension 30 mL  30 mL Oral Daily  PRN Clapacs, Jackquline DenmarkJohn T, MD      . sodium chloride (OCEAN) 0.65 % nasal spray 2 spray  2 spray Each Nare Q4H PRN Jesse SansFreeman, Megan M, MD        Lab Results: No results found for this or any previous visit (from the past 48 hour(s)).  Blood Alcohol level:  Lab Results  Component Value Date   ETH <10 04/12/2020   ETH <10 03/03/2018    Metabolic Disorder Labs: Lab Results  Component Value Date   HGBA1C 4.7 (L) 04/13/2020   MPG 88.19 04/13/2020   MPG 93.93 03/03/2018   Lab Results  Component Value Date   PROLACTIN 17.5 05/02/2014   Lab Results  Component Value Date   CHOL 122 04/13/2020   TRIG 50 04/13/2020   HDL 48 04/13/2020   CHOLHDL 2.5 04/13/2020   VLDL 10 04/13/2020   LDLCALC 64 04/13/2020   LDLCALC 90 03/03/2018    Physical Findings: AIMS: Facial and Oral Movements Muscles of Facial Expression: None, normal Lips and Perioral Area: None, normal Jaw: None, normal Tongue: None, normal,Extremity Movements Upper (arms, wrists, hands, fingers): None, normal Lower (legs, knees, ankles, toes): None, normal, Trunk Movements Neck, shoulders, hips: None, normal, Overall Severity Severity of abnormal movements (highest score from questions above): None, normal Incapacitation due to abnormal movements: None, normal Patient's awareness of abnormal movements (rate only patient's report): No Awareness, Dental Status Current problems with teeth and/or dentures?: No  CIWA:  CIWA-Ar Total: 0 COWS:  COWS Total Score: 2  Musculoskeletal: Strength & Muscle Tone: within normal limits Gait & Station: walks on tiptoes Patient leans: N/A  Psychiatric Specialty Exam: Physical Exam Vitals and nursing note  reviewed.  Constitutional:      Appearance: Normal appearance.  HENT:     Head: Normocephalic and atraumatic.     Right Ear: External ear normal.     Left Ear: External ear normal.     Nose: Nose normal.     Mouth/Throat:     Mouth: Mucous membranes are moist.     Pharynx: Oropharynx is clear.  Eyes:     Extraocular Movements: Extraocular movements intact.     Conjunctiva/sclera: Conjunctivae normal.     Pupils: Pupils are equal, round, and reactive to light.  Cardiovascular:     Rate and Rhythm: Normal rate.     Pulses: Normal pulses.  Pulmonary:     Effort: Pulmonary effort is normal.     Breath sounds: Normal breath sounds.  Abdominal:     General: Abdomen is flat.     Palpations: Abdomen is soft.  Musculoskeletal:        General: No swelling or tenderness. Normal range of motion.     Cervical back: Normal range of motion. No rigidity or tenderness.  Skin:    General: Skin is warm and dry.  Neurological:     General: No focal deficit present.     Mental Status: She is alert and oriented to person, place, and time.  Psychiatric:        Attention and Perception: She perceives auditory hallucinations.        Mood and Affect: Mood is anxious. Affect is flat.        Speech: Speech normal.        Behavior: Behavior is cooperative.        Thought Content: Thought content is paranoid and delusional.        Cognition and Memory: Cognition and memory normal.  Judgment: Judgment is impulsive.       Blood pressure 98/63, pulse 79, temperature 98.4 F (36.9 C), temperature source Oral, resp. rate 18, height 5\' 11"  (1.803 m), weight 63.5 kg, last menstrual period 03/30/2020, SpO2 96 %.Body mass index is 19.53 kg/m.  General Appearance: Casual  Eye Contact:  Fair  Speech:  Slow, but improved  Volume:  soft  Mood: okay  Affect:  neutral  Thought Process:  Coherent  Orientation:  Full (Time, Place, and Person)  Thought Content:  Logical  Suicidal Thoughts:  No   Homicidal Thoughts:  No  Memory:  Immediate;   Fair Recent;   Fair Remote;   Fair  Judgement:  Intact  Insight:  limited  Psychomotor Activity:  Restlessness  Concentration:  Concentration: Fair and Attention Span: Fair  Recall:  13/09/2019 of Knowledge:  Fair  Language:  Fair  Akathisia:  No  Handed:  Right  AIMS (if indicated):     Assets:  Desire for Improvement Housing Social Support  ADL's:  impaired  Cognition:  Impaired,  Mild  Sleep:  Number of Hours: 7.3     Treatment Plan Summary: Daily contact with patient to assess and evaluate symptoms and progress in treatment and Medication management   Patient is a 19 year old female with the above-stated past psychiatric history who is seen in follow-up.  Chart reviewed. Patient discussed with nursing. Patient continues to express psychotic symptoms, depression, anxiety. Symptoms did not improve with Seroquel up to 450 mg daily, or with Risperdal up to 4 mg daily. Both have been tapered and discontinued  Plan: -continue inpatient psych admission; 15-minute checks; daily contact with patient to assess and evaluate symptoms and progress in treatment; psychoeducation. Medications: -Continue Haldol to 10 mg QHS for psychosis increase cogentin 4 mg BID for EPS. -Increase Luvox to 100mg  in the monring, 150 mg QHS for depression, anxiety and OCD. -Continue  Haldol/Ativan/Bendaryl PRN for acute agitation.  -Pertinent Labs: no new labs ordered today -EKG: on 12/12 QTc 428 -Consults: No new consults placed since yesterday  -Disposition: patient is not ready for d/c at this time yet. - I certify that the patient does need, on a daily basis, active treatment furnished directly by or requiring the supervision of inpatient psychiatric facility personnel.   12/18 No changes  12/19 Tremor may benefit from low-dose propanolol 5 mg twice daily but blood pressure is lower.  We will hold off for the time being.  1/19,  MD 05/13/2020, 9:32 AM

## 2020-05-13 NOTE — Progress Notes (Signed)
Patient attended and participated in social work group, without any issues.

## 2020-05-13 NOTE — Plan of Care (Signed)
D- Patient alert and oriented. Patient presents in a pleasant mood on assessment stating that she slept ok last night and had some complaints of a headache, however, she stated that she did not need any pain medication at this time. Patient continues to endorse both depression and anxiety, stating "I've been here for so long". Patient denies SI, HI, AVH at this time. Patient's goal for today is to "be calm".  A- Scheduled medications administered to patient, per MD orders. Support and encouragement provided.  Routine safety checks conducted every 15 minutes.  Patient informed to notify staff with problems or concerns.  R- No adverse drug reactions noted. Patient contracts for safety at this time. Patient compliant with medications and treatment plan. Patient receptive, calm, and cooperative. Patient interacts well with others on the unit.  Patient remains safe at this time.  Problem: Consults Goal: Concurrent Medical Patient Education Description: (See Patient Education Module for education specifics) Outcome: Progressing   Problem: B[facility] Concurrent Medical Problem Goal: LTG-Pt will be physically stable and he/significant other Description: (Patient will be physically stable and he/significant other will be able to verbalize understanding of follow-up care and symptoms that would warrant further treatment) Outcome: Progressing Goal: STG-Vital signs will be within defined limits or stabilized Description: (STG- Vital signs will be within defined limits or stabilized for individual) Outcome: Progressing Goal: STG-Compliance with medication and/or treatment as ordered Description: (STG-Compliance with medication and/or treatment as ordered by MD) Outcome: Progressing Goal: STG-Verbalize two symptoms that would warrant further Description: (STG-Verbalize two symptoms that would warrant further treatment) Outcome: Progressing Goal: STG-Patient will participate in  management/stabilization Description: (STG-Patient will participate in management/stabilization of medical condition) Outcome: Progressing Goal: STG-Other (Specify): Description: STG-Other Concurrent Medical (Specify): Outcome: Progressing   Problem: Education: Goal: Utilization of techniques to improve thought processes will improve Outcome: Progressing Goal: Knowledge of the prescribed therapeutic regimen will improve Outcome: Progressing   Problem: Activity: Goal: Interest or engagement in leisure activities will improve Outcome: Progressing Goal: Imbalance in normal sleep/wake cycle will improve Outcome: Progressing   Problem: Coping: Goal: Coping ability will improve Outcome: Progressing Goal: Will verbalize feelings Outcome: Progressing   Problem: Health Behavior/Discharge Planning: Goal: Ability to make decisions will improve Outcome: Progressing Goal: Compliance with therapeutic regimen will improve Outcome: Progressing   Problem: Role Relationship: Goal: Will demonstrate positive changes in social behaviors and relationships Outcome: Progressing   Problem: Safety: Goal: Ability to disclose and discuss suicidal ideas will improve Outcome: Progressing Goal: Ability to identify and utilize support systems that promote safety will improve Outcome: Progressing   Problem: Self-Concept: Goal: Will verbalize positive feelings about self Outcome: Progressing Goal: Level of anxiety will decrease Outcome: Progressing   Problem: Education: Goal: Knowledge of [facility] Education information/materials will improve Outcome: Progressing Goal: Emotional status will improve Outcome: Progressing Goal: Mental status will improve Outcome: Progressing Goal: Verbalization of understanding the information provided will improve Outcome: Progressing

## 2020-05-14 NOTE — B[facility] Group Notes (Signed)
LCSW Group Therapy Note   05/14/2020 2:03 PM  Type of Therapy and Topic:  Group Therapy:  Overcoming Obstacles   Participation Level:  Minimal   Description of Group:    In this group patients will be encouraged to explore what they see as obstacles to their own wellness and recovery. They will be guided to discuss their thoughts, feelings, and behaviors related to these obstacles. The group will process together ways to cope with barriers, with attention given to specific choices patients can make. Each patient will be challenged to identify changes they are motivated to make in order to overcome their obstacles. This group will be process-oriented, with patients participating in exploration of their own experiences as well as giving and receiving support and challenge from other group members.   Therapeutic Goals: 1. Patient will identify personal and current obstacles as they relate to admission. 2. Patient will identify barriers that currently interfere with their wellness or overcoming obstacles.  3. Patient will identify feelings, thought process and behaviors related to these barriers. 4. Patient will identify two changes they are willing to make to overcome these obstacles:      Summary of Patient Progress Patient was present for the entirety of the group session. Patient participated in group introduction; Patient did not contribute to the topic of discussion.      Therapeutic Modalities:   Cognitive Behavioral Therapy Solution Focused Therapy Motivational Interviewing Relapse Prevention Therapy  Gwenevere Ghazi, MSW, [place], [place] 05/14/2020 2:03 PM

## 2020-05-14 NOTE — Plan of Care (Signed)
D- Patient alert and oriented. Patient presents in a pleasant mood on assessment stating that she slept good last night and had no complaints to voice to this Clinical research associate. Patient continues to endorse both depression and anxiety, stating that "being here" has her feeling this way. Patient denies SI, HI, AVH, and pain at this time. Patient's goal for today is to "not freak out", in which she will "occupy my mind", in order to achieve her goal.  A- Scheduled medications administered to patient, per MD orders. Support and encouragement provided.  Routine safety checks conducted every 15 minutes.  Patient informed to notify staff with problems or concerns.  R- No adverse drug reactions noted. Patient contracts for safety at this time. Patient compliant with medications and treatment plan. Patient receptive, calm, and cooperative. Patient interacts well with others on the unit.  Patient remains safe at this time.  Problem: Consults Goal: Concurrent Medical Patient Education Description: (See Patient Education Module for education specifics) Outcome: Progressing   Problem: [facility] Concurrent Medical Problem Goal: LTG-Pt will be physically stable and he/significant other Description: (Patient will be physically stable and he/significant other will be able to verbalize understanding of follow-up care and symptoms that would warrant further treatment) Outcome: Progressing Goal: STG-Vital signs will be within defined limits or stabilized Description: (STG- Vital signs will be within defined limits or stabilized for individual) Outcome: Progressing Goal: STG-Compliance with medication and/or treatment as ordered Description: (STG-Compliance with medication and/or treatment as ordered by MD) Outcome: Progressing Goal: STG-Verbalize two symptoms that would warrant further Description: (STG-Verbalize two symptoms that would warrant further treatment) Outcome: Progressing Goal: STG-Patient will participate in  management/stabilization Description: (STG-Patient will participate in management/stabilization of medical condition) Outcome: Progressing Goal: STG-Other (Specify): Description: STG-Other Concurrent Medical (Specify): Outcome: Progressing   Problem: Education: Goal: Utilization of techniques to improve thought processes will improve Outcome: Progressing Goal: Knowledge of the prescribed therapeutic regimen will improve Outcome: Progressing   Problem: Activity: Goal: Interest or engagement in leisure activities will improve Outcome: Progressing Goal: Imbalance in normal sleep/wake cycle will improve Outcome: Progressing   Problem: Coping: Goal: Coping ability will improve Outcome: Progressing Goal: Will verbalize feelings Outcome: Progressing   Problem: Health Behavior/Discharge Planning: Goal: Ability to make decisions will improve Outcome: Progressing Goal: Compliance with therapeutic regimen will improve Outcome: Progressing   Problem: Role Relationship: Goal: Will demonstrate positive changes in social behaviors and relationships Outcome: Progressing   Problem: Safety: Goal: Ability to disclose and discuss suicidal ideas will improve Outcome: Progressing Goal: Ability to identify and utilize support systems that promote safety will improve Outcome: Progressing   Problem: Self-Concept: Goal: Will verbalize positive feelings about self Outcome: Progressing Goal: Level of anxiety will decrease Outcome: Progressing   Problem: Education: Goal: Knowledge of [facility] Education information/materials will improve Outcome: Progressing Goal: Emotional status will improve Outcome: Progressing Goal: Mental status will improve Outcome: Progressing Goal: Verbalization of understanding the information provided will improve Outcome: Progressing

## 2020-05-14 NOTE — [facility] Counselor (Signed)
CSW met with Patient to discuss aftercare preferences. Patient reported that she had met with "Cari" associated with [facility] for "a few weeks" prior to admission. Counselors information was not listed on [employer]. CSW informed patient that the teams has called and left multiple messages in attempt to arrange aftercare. Patient reported that she was willing to consider other aftercare options. CSW suggested [facility] and [facility] as alternative options if counselor at [facility] has not been reached by discharge. Patient was agreeable with this plan. Patient provided verbal consent to discuss aftercare options with parents. Patient was oriented x4. Speech was clear and coherent, volume was low.   Durenda Hurt, MSW, [place], LCASA 05/14/2020 2:57 PM

## 2020-05-14 NOTE — Plan of Care (Signed)
°  Problem: Consults Goal: Concurrent Medical Patient Education Description: (See Patient Education Module for education specifics) Outcome: Progressing   Problem: [facility] Concurrent Medical Problem Goal: LTG-Pt will be physically stable and he/significant other Description: (Patient will be physically stable and he/significant other will be able to verbalize understanding of follow-up care and symptoms that would warrant further treatment) Outcome: Progressing Goal: STG-Vital signs will be within defined limits or stabilized Description: (STG- Vital signs will be within defined limits or stabilized for individual) Outcome: Progressing Goal: STG-Compliance with medication and/or treatment as ordered Description: (STG-Compliance with medication and/or treatment as ordered by MD) Outcome: Progressing Goal: STG-Verbalize two symptoms that would warrant further Description: (STG-Verbalize two symptoms that would warrant further treatment) Outcome: Progressing Goal: STG-Patient will participate in management/stabilization Description: (STG-Patient will participate in management/stabilization of medical condition) Outcome: Progressing Goal: STG-Other (Specify): Description: STG-Other Concurrent Medical (Specify): Outcome: Progressing   Problem: Education: Goal: Utilization of techniques to improve thought processes will improve Outcome: Progressing Goal: Knowledge of the prescribed therapeutic regimen will improve Outcome: Progressing   Problem: Activity: Goal: Interest or engagement in leisure activities will improve Outcome: Progressing Goal: Imbalance in normal sleep/wake cycle will improve Outcome: Progressing   Problem: Coping: Goal: Coping ability will improve Outcome: Progressing Goal: Will verbalize feelings Outcome: Progressing   Problem: Health Behavior/Discharge Planning: Goal: Ability to make decisions will improve Outcome: Progressing Goal: Compliance with therapeutic  regimen will improve Outcome: Progressing   Problem: Role Relationship: Goal: Will demonstrate positive changes in social behaviors and relationships Outcome: Progressing   Problem: Safety: Goal: Ability to disclose and discuss suicidal ideas will improve Outcome: Progressing Goal: Ability to identify and utilize support systems that promote safety will improve Outcome: Progressing   Problem: Self-Concept: Goal: Will verbalize positive feelings about self Outcome: Progressing Goal: Level of anxiety will decrease Outcome: Progressing   Problem: Education: Goal: Knowledge of [facility] Education information/materials will improve Outcome: Progressing Goal: Emotional status will improve Outcome: Progressing Goal: Mental status will improve Outcome: Progressing Goal: Verbalization of understanding the information provided will improve Outcome: Progressing   

## 2020-05-14 NOTE — Progress Notes (Signed)
Recreation Therapy Notes  Date: 05/14/2020  Time: 9:30 am   Location: Craft room  Behavioral response: Appropriate  Intervention Topic: Stress Management   Discussion/Intervention:  Group content on today was focused on stress. The group defined stress and way to cope with stress. Participants expressed how they know when they are stresses out. Individuals described the different ways they have to cope with stress. The group stated reasons why it is important to cope with stress. Patient explained what good stress is and some examples. The group participated in the intervention "Stress Management Jeopardy". Individuals were separated into two group and answered questions related to stress.  Clinical Observations/Feedback: Patient came to group and was focused on what peers and staff had to say about stress management. Individual was social with peers and staff participating in the intervention.  Maude Hettich LRT/CTRS         Latravia Southgate 05/14/2020 11:02 AM

## 2020-05-14 NOTE — Progress Notes (Signed)
Patient alert and oriented x 4. She is pleasant and easy to engage. Presents with bright affect and great eye contact when conversing.  She denies SI  HI  AVH and pain at this encounter.  She does endorse having both depression and anxiety. She states that both are mild and mostly due to her being here in the hospital. She is med compliant and tolerated her medication without incident. She has been active on the unit watching TV and engaging with others in the milieu. She is safe on the unit with 15 minute safety checks and was encourage to come to staff with any concerns.    Cleo Butler-Nicholson, LPN

## 2020-05-14 NOTE — Progress Notes (Signed)
[facility]  05/14/2020 9:41 AM Robin Hess  MRN:  [id number]  Principal Problem: Psychosis (HCC) Diagnosis: Principal Problem:   Psychosis (HCC) Active Problems:   Social anxiety disorder   OCD (obsessive compulsive disorder)  Robin Hess is a  19y.o. female patient [facility]. 12/20 Patient was seen in her room.  She engages well in the conversation.  Staff and nursing have noted improvement on her current medication regimen.  She denies any thoughts at this suicide anxiety or depression.  She openly indicates that her hallucinations and delusions are mild to today.  12/19 Robin Hess was seen in her room today.  She is brighter.  According to staff and nursing, she is more visible on the unit.  I am also told that she is attending groups and she confirms the same.  "I have learned to be more positive," she remarks when asked if this there is anything that she is learned from them.  So far today, she denies having any auditory hallucinations.  Still has a fixed delusion that the world is gone to end but is able to manage this without it affecting her day necessarily.  He did acknowledge that "people were yelling at her" in regards to hallucinations, telling her "I should just die."  They were moderate in intensity.  She feels mildly depressed but no suicidal thoughts.  No aggression, anger, or homicidal ideations. Tolerating her medications well.  Still reports mild tremor.   12/18 Today: Patient is seen and the office.  She expresses herself fairly well, better than when she was last seen by this provider last month.  She subjectively feels that her Haldol is working better than her other medications.  Reports that she is starting to go to groups now.  She reports "some" anxiety and depression that are moderate in intensity.  No thoughts of suicide no homicidal thoughts.  She reflects, "I should have listened to my mother, and I feel like  that is why I am here.  She told me to calm down but I ran through the back door because I thought someone was after me."  In hindsight she feels that this was not true.  In terms of auditory hallucinations, she feels that music will start playing out of nowhere.  Also, she will hear voices as well 1-2 times a day telling her that she is a "horrible person."  The intensity of these voices are moderate. She is unsure if her family is real.   Patient indicates that she slept well last night.  Tremors are mild.   Interval History Patient was seen today for re-evaluation.  Nursing reports no events overnight. Patient has been medication compliant.   Subjective:    Labs: no new results for review.  Total Time spent with patient: 30 minutes  Past Psychiatric History: Patient has had 2 prior hospitalizations in 2015 and 2019. Last diagnosis was depression and OCD. Did not follow-up with outpatient treatment. Not currently getting any outpatient treatment. Has made suicidal statements in the past. Recently had not been trying to hurt her self or showing any violence to others. Some history of cannabis use in the past but currently does not seem to be an active issue.   Past Medical History:  Past Medical History:  Diagnosis Date  . Allergy   . Anxiety   . Cannabis use disorder, moderate, dependence (HCC)   . Depression   . OCD (obsessive compulsive disorder)   .  Severe major depression, single episode, with psychotic features (HCC)   . Social anxiety disorder    History reviewed. No pertinent surgical history. Family History: History reviewed. No pertinent family history. Family Psychiatric  History: None reported Social History:  Social History   Substance and Sexual Activity  Alcohol Use No     Social History   Substance and Sexual Activity  Drug Use Yes  . Types: Marijuana    Social History   Socioeconomic History  . Marital status: Single    Spouse name: Not on  file  . Number of children: Not on file  . Years of education: Not on file  . Highest education level: Not on file  Occupational History  . Not on file  Tobacco Use  . Smoking status: Never Smoker  . Smokeless tobacco: Never Used  Substance and Sexual Activity  . Alcohol use: No  . Drug use: Yes    Types: Marijuana  . Sexual activity: Never    Birth control/protection: None  Other Topics Concern  . Not on file  Social History Narrative  . Not on file   Social Determinants of Health   Financial Resource Strain: Not on file  Food Insecurity: Not on file  Transportation Needs: Not on file  Physical Activity: Not on file  Stress: Not on file  Social Connections: Not on file   Additional Social History:                         Sleep: Fair  Appetite:  Fair  Current Medications: Current Facility-Administered Medications  Medication Dose Route Frequency Provider Last Rate Last Admin  . acetaminophen (TYLENOL) tablet 650 mg  650 mg Oral Q6H PRN Clapacs, [name], MD   650 mg at 05/09/20 0809  . alum & mag hydroxide-simeth (MAALOX/MYLANTA) 200-200-20 MG/5ML suspension 30 mL  30 mL Oral Q4H PRN Clapacs, [name], MD      . benztropine (COGENTIN) tablet 4 mg  4 mg Oral BID [name], MD   4 mg at 05/14/20 0802  . chlorproMAZINE (THORAZINE) injection 25 mg  25 mg Intramuscular TID PRN [name], MD      . haloperidol (HALDOL) tablet 5 mg  5 mg Oral Q8H PRN [name], MD   5 mg at 04/29/20 1529   And  . LORazepam (ATIVAN) tablet 2 mg  2 mg Oral Q8H PRN [name], MD   2 mg at 04/27/20 1627   And  . diphenhydrAMINE (BENADRYL) capsule 50 mg  50 mg Oral Q8H PRN [name], MD   50 mg at 04/29/20 1529  . feeding supplement (ENSURE ENLIVE / ENSURE PLUS) liquid 237 mL  237 mL Oral BID BM [name], MD   237 mL at 05/13/20 0951  . fluvoxaMINE (LUVOX) tablet 100 mg  100 mg Oral q AM [name], MD   100 mg at 05/14/20 0656  .  fluvoxaMINE (LUVOX) tablet 150 mg  150 mg Oral QHS [name], MD   150 mg at 05/13/20 2124  . haloperidol (HALDOL) tablet 10 mg  10 mg Oral QHS [name], MD   10 mg at 05/13/20 2125  . hydrOXYzine (ATARAX/VISTARIL) tablet 50 mg  50 mg Oral TID PRN Clapacs, [name], MD   50 mg at 05/13/20 2125  . LORazepam (ATIVAN) tablet 2 mg  2 mg Oral Q6H PRN [name], MD  2 mg at 05/11/20 0814   Or  . LORazepam (ATIVAN) injection 2 mg  2 mg Intramuscular Q6H PRN [name], MD   2 mg at 04/26/20 1645  . magnesium hydroxide (MILK OF MAGNESIA) suspension 30 mL  30 mL Oral Daily PRN Clapacs, [name], MD      . sodium chloride (OCEAN) 0.65 % nasal spray 2 spray  2 spray Each Nare Q4H PRN [name], MD        Lab Results: No results found for this or any previous visit (from the past 48 hour(s)).  Blood Alcohol level:  Lab Results  Component Value Date   ETH <10 04/12/2020   ETH <10 03/03/2018    Metabolic Disorder Labs: Lab Results  Component Value Date   HGBA1C 4.7 (L) 04/13/2020   MPG 88.19 04/13/2020   MPG 93.93 03/03/2018   Lab Results  Component Value Date   PROLACTIN 17.5 05/02/2014   Lab Results  Component Value Date   CHOL 122 04/13/2020   TRIG 50 04/13/2020   HDL 48 04/13/2020   CHOLHDL 2.5 04/13/2020   VLDL 10 04/13/2020   LDLCALC 64 04/13/2020   LDLCALC 90 03/03/2018    Physical Findings: AIMS: Facial and Oral Movements Muscles of Facial Expression: None, normal Lips and Perioral Area: None, normal Jaw: None, normal Tongue: None, normal,Extremity Movements Upper (arms, wrists, hands, fingers): None, normal Lower (legs, knees, ankles, toes): None, normal, Trunk Movements Neck, shoulders, hips: None, normal, Overall Severity Severity of abnormal movements (highest score from questions above): None, normal Incapacitation due to abnormal movements: None, normal Patient's awareness of abnormal movements (rate only patient's report): No  Awareness, Dental Status Current problems with teeth and/or dentures?: No  CIWA:  CIWA-Ar Total: 0 COWS:  COWS Total Score: 2  Musculoskeletal: Strength & Muscle Tone: within normal limits Gait & Station: walks on tiptoes Patient leans: N/A  Psychiatric Specialty Exam: Physical Exam Vitals and nursing note reviewed.  Constitutional:      Appearance: Normal appearance.  HENT:     Head: Normocephalic and atraumatic.     Right Ear: External ear normal.     Left Ear: External ear normal.     Nose: Nose normal.     Mouth/Throat:     Mouth: Mucous membranes are moist.     Pharynx: Oropharynx is clear.  Eyes:     Extraocular Movements: Extraocular movements intact.     Conjunctiva/sclera: Conjunctivae normal.     Pupils: Pupils are equal, round, and reactive to light.  Cardiovascular:     Rate and Rhythm: Normal rate.     Pulses: Normal pulses.  Pulmonary:     Effort: Pulmonary effort is normal.     Breath sounds: Normal breath sounds.  Abdominal:     General: Abdomen is flat.     Palpations: Abdomen is soft.  Musculoskeletal:        General: No swelling or tenderness. Normal range of motion.     Cervical back: Normal range of motion. No rigidity or tenderness.  Skin:    General: Skin is warm and dry.  Neurological:     General: No focal deficit present.     Mental Status: She is alert and oriented to person, place, and time.  Psychiatric:        Attention and Perception: She perceives auditory hallucinations.        Mood and Affect: Mood is anxious. Affect is flat.  Speech: Speech normal.        Behavior: Behavior is cooperative.        Thought Content: Thought content is paranoid and delusional.        Cognition and Memory: Cognition and memory normal.        Judgment: Judgment is impulsive.       Blood pressure 108/65, pulse 84, temperature 98 F (36.7 C), temperature source Oral, resp. rate 18, height 5\' 11"  (1.803 m), weight 63.5 kg, last menstrual period  03/30/2020, SpO2 97 %.Body mass index is 19.53 kg/m.  General Appearance: Casual  Eye Contact:  poor  Speech:  Slow, but improved  Volume:  soft  Mood: alright  Affect:  neutral  Thought Process:  Coherent  Orientation:  Full (Time, Place, and Person)  Thought Content:  Logical  Suicidal Thoughts:  No  Homicidal Thoughts:  No  Memory:  Immediate;   Fair Recent;   Fair Remote;   Fair  Judgement:  Intact  Insight:  limited  Psychomotor Activity:  Restlessness  Concentration:  Concentration: Fair and Attention Span: Fair  Recall:  13/09/2019 of Knowledge:  Fair  Language:  Fair  Akathisia:  No  Handed:  Right  AIMS (if indicated):     Assets:  Desire for Improvement Housing Social Support  ADL's:  impaired  Cognition:  Impaired,  Mild  Sleep:  Number of Hours: 8.5     Treatment Plan Summary: Daily contact with patient to assess and evaluate symptoms and progress in treatment and Medication management   Patient is a 19 year old female with the above-stated past psychiatric history who is seen in follow-up.  Chart reviewed. Patient discussed with nursing. Patient continues to express psychotic symptoms, depression, anxiety. Symptoms did not improve with Seroquel up to 450 mg daily, or with Risperdal up to 4 mg daily. Both have been tapered and discontinued  Plan: -continue inpatient psych admission; 15-minute checks; daily contact with patient to assess and evaluate symptoms and progress in treatment; psychoeducation. Medications: -Continue Haldol to 10 mg QHS for psychosis increase cogentin 4 mg BID for EPS. -Increase Luvox to 100mg  in the monring, 150 mg QHS for depression, anxiety and OCD. -Continue  Haldol/Ativan/Bendaryl PRN for acute agitation.  -Pertinent Labs: no new labs ordered today -EKG: on 12/12 QTc 428 -Consults: No new consults placed since yesterday  -Disposition: patient is not ready for d/c at this time yet. - I certify that the patient does need, on a  daily basis, active treatment furnished directly by or requiring the supervision of inpatient psychiatric facility personnel.   12/18 No changes  12/19 Tremor may benefit from low-dose propanolol 5 mg twice daily but blood pressure is lower.  We will hold off for the time being.  12/20 No sign of NMS or TD Tremors are mild  1/20, MD 05/14/2020, 9:41 AM

## 2020-05-15 NOTE — Plan of Care (Signed)
Pt rates depression, hopelessness and anxiety all all 2/10. Pt denies SI,  HI and VH. Pt has some AH that are soft. Pt was educated on care plan and verbalizes understanding. Torrie Mayers RN Problem: Consults Goal: Concurrent Medical Patient Education Description: (See Patient Education Module for education specifics) Outcome: Progressing   Problem: [facility] Concurrent Medical Problem Goal: LTG-Pt will be physically stable and he/significant other Description: (Patient will be physically stable and he/significant other will be able to verbalize understanding of follow-up care and symptoms that would warrant further treatment) Outcome: Progressing Goal: STG-Vital signs will be within defined limits or stabilized Description: (STG- Vital signs will be within defined limits or stabilized for individual) Outcome: Progressing Goal: STG-Compliance with medication and/or treatment as ordered Description: (STG-Compliance with medication and/or treatment as ordered by MD) Outcome: Progressing Goal: STG-Verbalize two symptoms that would warrant further Description: (STG-Verbalize two symptoms that would warrant further treatment) Outcome: Progressing Goal: STG-Patient will participate in management/stabilization Description: (STG-Patient will participate in management/stabilization of medical condition) Outcome: Progressing Goal: STG-Other (Specify): Description: STG-Other Concurrent Medical (Specify): Outcome: Progressing   Problem: Education: Goal: Utilization of techniques to improve thought processes will improve Outcome: Progressing Goal: Knowledge of the prescribed therapeutic regimen will improve Outcome: Progressing   Problem: Activity: Goal: Interest or engagement in leisure activities will improve Outcome: Progressing Goal: Imbalance in normal sleep/wake cycle will improve Outcome: Progressing   Problem: Coping: Goal: Coping ability will improve Outcome: Progressing Goal: Will  verbalize feelings Outcome: Progressing   Problem: Health Behavior/Discharge Planning: Goal: Ability to make decisions will improve Outcome: Progressing Goal: Compliance with therapeutic regimen will improve Outcome: Progressing   Problem: Role Relationship: Goal: Will demonstrate positive changes in social behaviors and relationships Outcome: Progressing   Problem: Safety: Goal: Ability to disclose and discuss suicidal ideas will improve Outcome: Progressing Goal: Ability to identify and utilize support systems that promote safety will improve Outcome: Progressing   Problem: Self-Concept: Goal: Will verbalize positive feelings about self Outcome: Progressing Goal: Level of anxiety will decrease Outcome: Progressing   Problem: Education: Goal: Knowledge of [facility] Education information/materials will improve Outcome: Progressing Goal: Emotional status will improve Outcome: Progressing Goal: Mental status will improve Outcome: Progressing Goal: Verbalization of understanding the information provided will improve Outcome: Progressing

## 2020-05-15 NOTE — Progress Notes (Signed)
Recreation Therapy Notes  Date: 05/15/2020  Time: 9:30 am   Location: Craft room  Behavioral response: Appropriate  Intervention Topic: Self-esteem   Discussion/Intervention:  Group content today was focused on self-esteem. Patient defined self-esteem and where it comes form. The group described reasons self-esteem is important. Individuals stated things that impact self-esteem and positive ways to improve self-esteem. The group participated in the intervention "Collage of Me" where patients were able to create a collage of positive things that makes them who they are. Clinical Observations/Feedback: Patient came to group and was focused on what peers and staff had to say about self-esteem. Individual was social with peers and staff participating in the intervention.  Shelene Krage LRT/CTRS         Kamira Mellette 05/15/2020 11:39 AM

## 2020-05-15 NOTE — Progress Notes (Signed)
[facility]  05/15/2020 7:58 AM Robin Hess  MRN:  [id number]  Principal Problem: Psychosis (HCC) Diagnosis: Principal Problem:   Psychosis (HCC) Active Problems:   Social anxiety disorder   OCD (obsessive compulsive disorder)  Robin Hess is a  19y.o. female patient [facility]. 12/21 Patient reports feeling well today.  She expresses her self without any hesitancy.  Denies having any important sharing points.  Tolerating medications well.  No excessive daytime sedation.  No new medical issues.  Says that she is sleeping and eating well.  Still endorses mild depression and anxiety.  Also reports mild hallucinations and delusions.  Denies safety concerns.  12/20 Patient was seen in her room.  She engages well in the conversation.  Staff and nursing have noted improvement on her current medication regimen.  She denies any thoughts at this suicide anxiety or depression.  She openly indicates that her hallucinations and delusions are mild to today.  12/19 Robin Hess was seen in her room today.  She is brighter.  According to staff and nursing, she is more visible on the unit.  I am also told that she is attending groups and she confirms the same.  "I have learned to be more positive," she remarks when asked if this there is anything that she is learned from them.  So far today, she denies having any auditory hallucinations.  Still has a fixed delusion that the world is gone to end but is able to manage this without it affecting her day necessarily.  He did acknowledge that "people were yelling at her" in regards to hallucinations, telling her "I should just die."  They were moderate in intensity.  She feels mildly depressed but no suicidal thoughts.  No aggression, anger, or homicidal ideations. Tolerating her medications well.  Still reports mild tremor.   12/18 Today: Patient is seen and the office.  She expresses herself fairly well, better than  when she was last seen by this provider last month.  She subjectively feels that her Haldol is working better than her other medications.  Reports that she is starting to go to groups now.  She reports "some" anxiety and depression that are moderate in intensity.  No thoughts of suicide no homicidal thoughts.  She reflects, "I should have listened to my mother, and I feel like that is why I am here.  She told me to calm down but I ran through the back door because I thought someone was after me."  In hindsight she feels that this was not true.  In terms of auditory hallucinations, she feels that music will start playing out of nowhere.  Also, she will hear voices as well 1-2 times a day telling her that she is a "horrible person."  The intensity of these voices are moderate. She is unsure if her family is real.   Patient indicates that she slept well last night.  Tremors are mild.   Interval History Patient was seen today for re-evaluation.  Nursing reports no events overnight. Patient has been medication compliant.   Subjective:    Labs: no new results for review.  Total Time spent with patient: 30 minutes  Past Psychiatric History: Patient has had 2 prior hospitalizations in 2015 and 2019. Last diagnosis was depression and OCD. Did not follow-up with outpatient treatment. Not currently getting any outpatient treatment. Has made suicidal statements in the past. Recently had not been trying to hurt her self  or showing any violence to others. Some history of cannabis use in the past but currently does not seem to be an active issue.   Past Medical History:  Past Medical History:  Diagnosis Date   Allergy    Anxiety    Cannabis use disorder, moderate, dependence (HCC)    Depression    OCD (obsessive compulsive disorder)    Severe major depression, single episode, with psychotic features (HCC)    Social anxiety disorder    History reviewed. No pertinent surgical  history. Family History: History reviewed. No pertinent family history. Family Psychiatric  History: None reported Social History:  Social History   Substance and Sexual Activity  Alcohol Use No     Social History   Substance and Sexual Activity  Drug Use Yes   Types: Marijuana    Social History   Socioeconomic History   Marital status: Single    Spouse name: Not on file   Number of children: Not on file   Years of education: Not on file   Highest education level: Not on file  Occupational History   Not on file  Tobacco Use   Smoking status: Never Smoker   Smokeless tobacco: Never Used  Substance and Sexual Activity   Alcohol use: No   Drug use: Yes    Types: Marijuana   Sexual activity: Never    Birth control/protection: None  Other Topics Concern   Not on file  Social History Narrative   Not on file   Social Determinants of Health   Financial Resource Strain: Not on file  Food Insecurity: Not on file  Transportation Needs: Not on file  Physical Activity: Not on file  Stress: Not on file  Social Connections: Not on file   Additional Social History:                         Sleep: Fair  Appetite:  Fair  Current Medications: Current Facility-Administered Medications  Medication Dose Route Frequency Provider Last Rate Last Admin   acetaminophen (TYLENOL) tablet 650 mg  650 mg Oral Q6H PRN Clapacs, [name], MD   650 mg at 05/09/20 0809   alum & mag hydroxide-simeth (MAALOX/MYLANTA) 200-200-20 MG/5ML suspension 30 mL  30 mL Oral Q4H PRN Clapacs, [name], MD       benztropine (COGENTIN) tablet 4 mg  4 mg Oral BID [name], MD   4 mg at 05/14/20 1627   chlorproMAZINE (THORAZINE) injection 25 mg  25 mg Intramuscular TID PRN [name], MD       haloperidol (HALDOL) tablet 5 mg  5 mg Oral Q8H PRN [name], MD   5 mg at 04/29/20 1529   And   LORazepam (ATIVAN) tablet 2 mg  2 mg Oral Q8H PRN [name], MD   2  mg at 04/27/20 1627   And   diphenhydrAMINE (BENADRYL) capsule 50 mg  50 mg Oral Q8H PRN [name], MD   50 mg at 04/29/20 1529   feeding supplement (ENSURE ENLIVE / ENSURE PLUS) liquid 237 mL  237 mL Oral BID BM [name], MD   237 mL at 05/14/20 1627   fluvoxaMINE (LUVOX) tablet 100 mg  100 mg Oral q AM [name], MD   100 mg at 05/14/20 0656   fluvoxaMINE (LUVOX) tablet 150 mg  150 mg Oral QHS [name], MD   150 mg at 05/14/20  2047   haloperidol (HALDOL) tablet 10 mg  10 mg Oral QHS [name], MD   10 mg at 05/14/20 2047   hydrOXYzine (ATARAX/VISTARIL) tablet 50 mg  50 mg Oral TID PRN Clapacs, [name], MD   50 mg at 05/13/20 2125   LORazepam (ATIVAN) tablet 2 mg  2 mg Oral Q6H PRN [name], MD   2 mg at 05/11/20 1478   Or   LORazepam (ATIVAN) injection 2 mg  2 mg Intramuscular Q6H PRN [name], MD   2 mg at 04/26/20 1645   magnesium hydroxide (MILK OF MAGNESIA) suspension 30 mL  30 mL Oral Daily PRN Clapacs, [name], MD       sodium chloride (OCEAN) 0.65 % nasal spray 2 spray  2 spray Each Nare Q4H PRN [name], MD        Lab Results: No results found for this or any previous visit (from the past 48 hour(s)).  Blood Alcohol level:  Lab Results  Component Value Date   ETH <10 04/12/2020   ETH <10 03/03/2018    Metabolic Disorder Labs: Lab Results  Component Value Date   HGBA1C 4.7 (L) 04/13/2020   MPG 88.19 04/13/2020   MPG 93.93 03/03/2018   Lab Results  Component Value Date   PROLACTIN 17.5 05/02/2014   Lab Results  Component Value Date   CHOL 122 04/13/2020   TRIG 50 04/13/2020   HDL 48 04/13/2020   CHOLHDL 2.5 04/13/2020   VLDL 10 04/13/2020   LDLCALC 64 04/13/2020   LDLCALC 90 03/03/2018    Physical Findings: AIMS: Facial and Oral Movements Muscles of Facial Expression: None, normal Lips and Perioral Area: None, normal Jaw: None, normal Tongue: None, normal,Extremity Movements Upper (arms,  wrists, hands, fingers): None, normal Lower (legs, knees, ankles, toes): None, normal, Trunk Movements Neck, shoulders, hips: None, normal, Overall Severity Severity of abnormal movements (highest score from questions above): None, normal Incapacitation due to abnormal movements: None, normal Patient's awareness of abnormal movements (rate only patient's report): No Awareness, Dental Status Current problems with teeth and/or dentures?: No  CIWA:  CIWA-Ar Total: 0 COWS:  COWS Total Score: 2  Musculoskeletal: Strength & Muscle Tone: within normal limits Gait & Station: walks on tiptoes Patient leans: N/A  Psychiatric Specialty Exam: Physical Exam Vitals and nursing note reviewed.  Constitutional:      Appearance: Normal appearance.  HENT:     Head: Normocephalic and atraumatic.     Right Ear: External ear normal.     Left Ear: External ear normal.     Nose: Nose normal.     Mouth/Throat:     Mouth: Mucous membranes are moist.     Pharynx: Oropharynx is clear.  Eyes:     Extraocular Movements: Extraocular movements intact.     Conjunctiva/sclera: Conjunctivae normal.     Pupils: Pupils are equal, round, and reactive to light.  Cardiovascular:     Rate and Rhythm: Normal rate.     Pulses: Normal pulses.  Pulmonary:     Effort: Pulmonary effort is normal.     Breath sounds: Normal breath sounds.  Abdominal:     General: Abdomen is flat.     Palpations: Abdomen is soft.  Musculoskeletal:        General: No swelling or tenderness. Normal range of motion.     Cervical back: Normal range of motion. No rigidity or tenderness.  Skin:    General: Skin is warm  and dry.  Neurological:     General: No focal deficit present.     Mental Status: She is alert and oriented to person, place, and time.  Psychiatric:        Attention and Perception: She perceives auditory hallucinations.        Mood and Affect: Mood is anxious. Affect is flat.        Speech: Speech normal.         Behavior: Behavior is cooperative.        Thought Content: Thought content is paranoid and delusional.        Cognition and Memory: Cognition and memory normal.        Judgment: Judgment is impulsive.       Blood pressure 107/68, pulse 92, temperature 97.9 F (36.6 C), temperature source Oral, resp. rate 18, height 5\' 11"  (1.803 m), weight 63.5 kg, last menstrual period 03/30/2020, SpO2 98 %.Body mass index is 19.53 kg/m.  General Appearance: Casual  Eye Contact:  poor  Speech:  Slow, but improved  Volume:  soft  Mood: fine  Affect:  neutral  Thought Process:  Coherent  Orientation:  Full (Time, Place, and Person)  Thought Content:  Logical  Suicidal Thoughts:  No  Homicidal Thoughts:  No  Memory:  Immediate;   Fair Recent;   Fair Remote;   Fair  Judgement:  Intact  Insight:  limited  Psychomotor Activity:  Restlessness  Concentration:  Concentration: Fair and Attention Span: Fair  Recall:  13/09/2019 of Knowledge:  Fair  Language:  Fair  Akathisia:  No  Handed:  Right  AIMS (if indicated):     Assets:  Desire for Improvement Housing Social Support  ADL's:  impaired  Cognition:  Impaired,  Mild  Sleep:  Number of Hours: 7.5     Treatment Plan Summary: Daily contact with patient to assess and evaluate symptoms and progress in treatment and Medication management   Patient is a 19 year old female with the above-stated past psychiatric history who is seen in follow-up.  Chart reviewed. Patient discussed with nursing. Patient continues to express psychotic symptoms, depression, anxiety. Symptoms did not improve with Seroquel up to 450 mg daily, or with Risperdal up to 4 mg daily. Both have been tapered and discontinued  Plan: -continue inpatient psych admission; 15-minute checks; daily contact with patient to assess and evaluate symptoms and progress in treatment; psychoeducation. Medications: -Continue Haldol to 10 mg QHS for psychosis increase cogentin 4 mg BID for  EPS. -Increase Luvox to 100mg  in the monring, 150 mg QHS for depression, anxiety and OCD. -Continue  Haldol/Ativan/Bendaryl PRN for acute agitation.  -Pertinent Labs: no new labs ordered today -EKG: on 12/12 QTc 428 -Consults: No new consults placed since yesterday  -Disposition: patient is not ready for d/c at this time yet. - I certify that the patient does need, on a daily basis, active treatment furnished directly by or requiring the supervision of inpatient psychiatric facility personnel.   12/18 No changes  12/19 Tremor may benefit from low-dose propanolol 5 mg twice daily but blood pressure is lower.  We will hold off for the time being.  12/20 No sign of NMS or TD Tremors are mild  12/21 Patient likely approaching optimal benefit from her hospitalization.  Consider planning. 1/21, MD 05/15/2020, 7:58 AM

## 2020-05-15 NOTE — Plan of Care (Signed)
  Problem: Coping: Goal: Coping ability will improve Outcome: Progressing Goal: Will verbalize feelings Outcome: Progressing   Problem: Safety: Goal: Ability to disclose and discuss suicidal ideas will improve Outcome: Progressing

## 2020-05-15 NOTE — [facility] Counselor (Signed)
LCSW Group Therapy Note  05/15/2020 2:00 PM  Type of Therapy/Topic:  Group Therapy:  Feelings about Diagnosis  Participation Level:  Minimal   Description of Group:   This group will allow patients to explore their thoughts and feelings about diagnoses they have received. Patients will be guided to explore their level of understanding and acceptance of these diagnoses. Facilitator will encourage patients to process their thoughts and feelings about the reactions of others to their diagnosis and will guide patients in identifying ways to discuss their diagnosis with significant others in their lives. This group will be process-oriented, with patients participating in exploration of their own experiences, giving and receiving support, and processing challenge from other group members.   Therapeutic Goals: 1. Patient will demonstrate understanding of diagnosis as evidenced by identifying two or more symptoms of the disorder 2. Patient will be able to express two feelings regarding the diagnosis 3. Patient will demonstrate their ability to communicate their needs through discussion and/or role play  Summary of Patient Progress: Patient was present throughout the entirety of the group. She responded to questions when they were directed to her but was mostly quiet.   Therapeutic Modalities:   Cognitive Behavioral Therapy Brief Therapy Feelings Identification   Kathren Scearce R. Algis Greenhouse, MSW, LCSW, LCAS 05/15/2020 2:00 PM

## 2020-05-15 NOTE — Progress Notes (Signed)
Pt states that she feels "a little better" today. Pt went to groups. Pt has been med compliant, calm and cooperative. Pt has been out of room in dayroom also. Pt is safe.  Pt states that she is ready to go home "I'm tired of this place".   Torrie Mayers RN

## 2020-05-15 NOTE — Plan of Care (Signed)
  Problem: Activity: Goal: Interest or engagement in leisure activities will improve Outcome: Progressing Goal: Imbalance in normal sleep/wake cycle will improve Outcome: Progressing   Problem: Coping: Goal: Coping ability will improve Outcome: Progressing   Problem: Health Behavior/Discharge Planning: Goal: Compliance with therapeutic regimen will improve Outcome: Progressing

## 2020-05-15 NOTE — Progress Notes (Signed)
Patient continues to endorse depression and anxiety. Pt states ready to be discharged. Denies any SI, HI, AVH. Medication compliant. Isolative to self and room. Minimal information from patient. Encouragement and support provided. Safety checks maintained. Pt receptive and remains safe on unit with q 15 min checks.

## 2020-05-15 NOTE — Progress Notes (Signed)
Patient pleasant and cooperative. Medication compliant. Appropriate with staff and peers. Denies SI, HI, AVH. Endorses sadness and depression with improvement. Encouragement and support provided. Safety checks maintained. Medications given as prescribed. Pt remains safe on unit with q 15 min checks.

## 2020-05-16 MED ORDER — BENZTROPINE MESYLATE 2 MG PO TABS: 4.0000 mg | ORAL_TABLET | Freq: Two times a day (BID) | ORAL | 1 refills | Status: AC

## 2020-05-16 MED ORDER — HALOPERIDOL 10 MG PO TABS: 10.0000 mg | ORAL_TABLET | Freq: Every day | ORAL | 1 refills | Status: AC

## 2020-05-16 MED ORDER — FLUVOXAMINE MALEATE 100 MG PO TABS: ORAL_TABLET | ORAL | 1 refills | Status: AC

## 2020-05-16 MED ORDER — HYDROXYZINE HCL 50 MG PO TABS: 50.0000 mg | ORAL_TABLET | Freq: Three times a day (TID) | ORAL | 1 refills | Status: AC | PRN

## 2020-05-16 NOTE — Progress Notes (Signed)
  [facility] Adult Case Management Discharge Plan :  Will you be returning to the same living situation after discharge:  Yes,  pt reports that she is returnig home. At discharge, do you have transportation home?: Yes,  pt reports that her mother will provide transportation. Do you have the ability to pay for your medications: Yes,  [facility]  Release of information consent forms completed and in the chart;  Patient's signature needed at discharge.  Patient to Follow up at:  Follow-up Information    [facility] Go on 05/24/2020.   Why: Appointment is scheduled with Ms. Sun on 05/24/2020 at 6:00PM.  Please bring an updated copy of your insurance card.  Thanks! Contact information: [address] [place], [place] [address] Phone: [phone]  Fax: [phone]       [facility] Follow up.   Specialty: Behavioral Health Why: Calls have been made to refer you to the partial hospitalization program (PHP).  Please follow up!  Thanks! Contact information: [address] [place] [address] [phone]              Next level of care provider has access to [facility] Link:no  Safety Planning and Suicide Prevention discussed: Yes,  SPE completed with pt and pt's mother.  Have you used any form of tobacco in the last 30 days? (Cigarettes, Smokeless Tobacco, Cigars, and/or Pipes): No  Has patient been referred to the Quitline?: Patient refused referral  Patient has been referred for addiction treatment: Pt. refused referral  [name], LCSW 05/16/2020, 12:50 PM

## 2020-05-16 NOTE — Progress Notes (Signed)
Recreation Therapy Notes  Date: 05/16/2020  Time: 9:30 am   Location: Craft room   Behavioral response: Appropriate  Intervention Topic: Time management   Discussion/Intervention:  Group content today was focused on time management. The group defined time management and identified healthy ways to manage time. Individuals expressed how much of the 24 hours they use in a day. Patients expressed how much time they use just for themselves personally. The group expressed how they have managed their time in the past. Individuals participated in the intervention "Managing Life" where they had a chance to see how much of the 24 hours they use and where it goes.  Clinical Observations/Feedback: Patient came to group and was focused on what peers and staff had to say about time management. Individual was social with peers and staff while participating in the intervention.  Thersa Mohiuddin LRT/CTRS         Maks Cavallero 05/16/2020 11:20 AM

## 2020-05-16 NOTE — Progress Notes (Signed)
Recreation Therapy Notes  INPATIENT RECREATION TR PLAN  Patient Details Name: Robin Hess MRN: [id number] DOB: 2001-02-08 Today's Date: 05/16/2020  Rec Therapy Plan Is patient appropriate for Therapeutic Recreation?: Yes Treatment times per week: at least 3 Estimated Length of Stay: 5-7 days TR Treatment/Interventions: Group participation (Comment)  Discharge Criteria Pt will be discharged from therapy if:: Discharged Treatment plan/goals/alternatives discussed and agreed upon by:: Patient/family  Discharge Summary Short term goals set: Patient will engage in groups without prompting or encouragement from LRT x3 group sessions within 5 recreation therapy group sessions Short term goals met: Complete Progress toward goals comments: Groups attended Which groups?: Self-esteem,Stress management,Communication,AAA/T,Social skills,Other (Comment) (Time management, Emotions) Reason goals not met: N/A Therapeutic equipment acquired: N/A Reason patient discharged from therapy: Discharge from hospital Pt/family agrees with progress & goals achieved: Yes Date patient discharged from therapy: 05/16/20   Robin Hess 05/16/2020, 11:52 AM

## 2020-05-16 NOTE — Plan of Care (Signed)
Pt rates depression 4/10, hopelessness 1/10 and anxiety 5/10. Pt denies SI, HI and AVH. Pt was educated on care plan and verbalizes understanding. Torrie Mayers RN Problem: Consults Goal: Concurrent Medical Patient Education Description: (See Patient Education Module for education specifics) Outcome: Adequate for Discharge   Problem: [facility] Concurrent Medical Problem Goal: LTG-Pt will be physically stable and he/significant other Description: (Patient will be physically stable and he/significant other will be able to verbalize understanding of follow-up care and symptoms that would warrant further treatment) Outcome: Adequate for Discharge Goal: STG-Vital signs will be within defined limits or stabilized Description: (STG- Vital signs will be within defined limits or stabilized for individual) Outcome: Adequate for Discharge Goal: STG-Compliance with medication and/or treatment as ordered Description: (STG-Compliance with medication and/or treatment as ordered by MD) Outcome: Adequate for Discharge Goal: STG-Verbalize two symptoms that would warrant further Description: (STG-Verbalize two symptoms that would warrant further treatment) Outcome: Adequate for Discharge Goal: STG-Patient will participate in management/stabilization Description: (STG-Patient will participate in management/stabilization of medical condition) Outcome: Adequate for Discharge Goal: STG-Other (Specify): Description: STG-Other Concurrent Medical (Specify): Outcome: Adequate for Discharge   Problem: Education: Goal: Utilization of techniques to improve thought processes will improve Outcome: Adequate for Discharge Goal: Knowledge of the prescribed therapeutic regimen will improve Outcome: Adequate for Discharge   Problem: Activity: Goal: Interest or engagement in leisure activities will improve Outcome: Adequate for Discharge Goal: Imbalance in normal sleep/wake cycle will improve Outcome: Adequate for  Discharge   Problem: Coping: Goal: Coping ability will improve Outcome: Adequate for Discharge Goal: Will verbalize feelings Outcome: Adequate for Discharge   Problem: Health Behavior/Discharge Planning: Goal: Ability to make decisions will improve Outcome: Adequate for Discharge Goal: Compliance with therapeutic regimen will improve Outcome: Adequate for Discharge   Problem: Role Relationship: Goal: Will demonstrate positive changes in social behaviors and relationships Outcome: Adequate for Discharge   Problem: Safety: Goal: Ability to disclose and discuss suicidal ideas will improve Outcome: Adequate for Discharge Goal: Ability to identify and utilize support systems that promote safety will improve Outcome: Adequate for Discharge

## 2020-05-16 NOTE — Discharge Summary (Signed)
Physician Discharge Summary Note  Patient:  Robin Hess is an 19 y.o., female MRN:  [id number] DOB:  2001-04-30 Patient phone:  [phone] (home)  Patient address:   [address] [place] [address],  Total Time spent with patient: 30 minutes  Date of Admission:  04/13/2020 Date of Discharge: 05/16/2020  Reason for Admission:   19 year old woman brought into the hospital under IVC Patient was brought in by EMS after running away from family running out through the woods with bizarre talking behavior. Reported by EMS that the patient was muttering to herself about being guilty of something or of having killed someone.  Principal Problem: Severe major depression, single episode, with psychotic features [facility]) Discharge Diagnoses: Principal Problem:   Severe major depression, single episode, with psychotic features (HCC) Active Problems:   Social anxiety disorder   OCD (obsessive compulsive disorder)   Past Psychiatric History: Patient has had 2 prior hospitalizations in 2015 and 2019. Last diagnosis was depression and OCD. Did not follow-up with outpatient treatment. Not currently getting any outpatient treatment. Has made suicidal statements in the past. Recently had not been trying to hurt her self or showing any violence to others. Some history of cannabis use in the past but currently does not seem to be an active issue.  Past Medical History:  Past Medical History:  Diagnosis Date  . Allergy   . Anxiety   . Cannabis use disorder, moderate, dependence (HCC)   . Depression   . OCD (obsessive compulsive disorder)   . Severe major depression, single episode, with psychotic features (HCC)   . Social anxiety disorder    History reviewed. No pertinent surgical history. Family History: History reviewed. No pertinent family history. Family Psychiatric  History: None reported Social History:  Social History   Substance and Sexual Activity  Alcohol Use No      Social History   Substance and Sexual Activity  Drug Use Yes  . Types: Marijuana    Social History   Socioeconomic History  . Marital status: Single    Spouse name: Not on file  . Number of children: Not on file  . Years of education: Not on file  . Highest education level: Not on file  Occupational History  . Not on file  Tobacco Use  . Smoking status: Never Smoker  . Smokeless tobacco: Never Used  Substance and Sexual Activity  . Alcohol use: No  . Drug use: Yes    Types: Marijuana  . Sexual activity: Never    Birth control/protection: None  Other Topics Concern  . Not on file  Social History Narrative  . Not on file   Social Determinants of Health   Financial Resource Strain: Not on file  Food Insecurity: Not on file  Transportation Needs: Not on file  Physical Activity: Not on file  Stress: Not on file  Social Connections: Not on file    Hospital Course:  19 year old woman brought into the hospital under IVC Patient was brought in by EMS after running away from family running out through the woods with bizarre talking behavior. Reported by EMS that the patient was muttering to herself about being guilty of something or of having killed someone. On admission patient exhibited severe thought blocking and speech delay. At the beginning of her stay she required both oral and IM injections for acute agitation. However, she did not require physical hold or seclusion at any time.  Initially, she was placed on Seroquel and titrated to  450 mg daily without improvement. This was cross-tapered with Risperdal on which she also did not improve on up to 4 mg nightly. Lastly, she was placed on Haldol and titrated to 10 mg nightly. She did experience acute dystonia at this dose, and cogentin needed to be increased to 4 mg BID to assist with EPS side effects. Even with this dose of cogentin she has a mild tremor in bilateral hands. For this reason, a haldol dec was not provided. During  her stay she was also restarted on Luvox and titrated to 100 mg in the morning, 150 mg at night for MDD and OCD. She was given Hydroxyzine PRN for anxiety, and trazodone was discontinued. On this regimen, patient gradually improved. Her thoughts were no longer blocked, and she was able to participate meaningfully in groups. At time of discharge she denied suicidal ideations, homicidal ideations, visual hallucinations, and auditory hallucinations.   Mother contacted prior to discharge and she reported that Eboni seemed the best she had her entire stay the day prior to discharge. She also reported a good conversation the morning of discharge. Discussed diagnosis, current medication regimen, and need for continued medication and outpatient follow-up. Mother in agreement with the plan. At time of discharge patient, mother, and treatment team felt she was safe to discharge with close outpatient follow-up.   Physical Findings: AIMS: Facial and Oral Movements Muscles of Facial Expression: None, normal Lips and Perioral Area: None, normal Jaw: None, normal Tongue: None, normal,Extremity Movements Upper (arms, wrists, hands, fingers): None, normal Lower (legs, knees, ankles, toes): None, normal, Trunk Movements Neck, shoulders, hips: None, normal, Overall Severity Severity of abnormal movements (highest score from questions above): None, normal Incapacitation due to abnormal movements: None, normal Patient's awareness of abnormal movements (rate only patient's report): No Awareness, Dental Status Current problems with teeth and/or dentures?: No  CIWA:  CIWA-Ar Total: 0 COWS:  COWS Total Score: 2  Musculoskeletal: Strength & Muscle Tone: within normal limits Gait & Station: normal Patient leans: N/A  Psychiatric Specialty Exam: Physical Exam Vitals and nursing note reviewed.  Constitutional:      Appearance: Normal appearance.  HENT:     Head: Normocephalic and atraumatic.     Right Ear:  External ear normal.     Left Ear: External ear normal.     Nose: Nose normal.     Mouth/Throat:     Mouth: Mucous membranes are moist.     Pharynx: Oropharynx is clear.  Eyes:     Extraocular Movements: Extraocular movements intact.     Conjunctiva/sclera: Conjunctivae normal.     Pupils: Pupils are equal, round, and reactive to light.  Cardiovascular:     Rate and Rhythm: Normal rate.     Pulses: Normal pulses.  Pulmonary:     Effort: Pulmonary effort is normal.     Breath sounds: Normal breath sounds.  Abdominal:     General: Abdomen is flat.     Palpations: Abdomen is soft.  Musculoskeletal:        General: No swelling. Normal range of motion.     Cervical back: Normal range of motion and neck supple.  Skin:    General: Skin is warm and dry.  Neurological:     General: No focal deficit present.     Mental Status: She is alert and oriented to person, place, and time.  Psychiatric:        Mood and Affect: Mood normal.        Behavior:  Behavior normal.        Thought Content: Thought content normal.        Judgment: Judgment normal.     Review of Systems  Constitutional: Negative for appetite change and fatigue.  HENT: Negative for rhinorrhea and sore throat.   Eyes: Negative for photophobia and visual disturbance.  Respiratory: Negative for cough and shortness of breath.   Cardiovascular: Negative for chest pain and palpitations.  Gastrointestinal: Negative for constipation, diarrhea, nausea and vomiting.  Endocrine: Negative for cold intolerance and heat intolerance.  Genitourinary: Negative for difficulty urinating and dysuria.  Musculoskeletal: Negative for arthralgias and myalgias.  Skin: Negative for rash and wound.  Allergic/Immunologic: Negative for food allergies and immunocompromised state.  Neurological: Negative for dizziness and headaches.  Hematological: Negative for adenopathy. Does not bruise/bleed easily.  Psychiatric/Behavioral: Negative for  agitation, behavioral problems, dysphoric mood, hallucinations and suicidal ideas. The patient is not nervous/anxious.    Blood pressure 96/68, pulse 94, temperature 98.1 F (36.7 C), temperature source Oral, resp. rate 17, height 5\' 11"  (1.803 m), weight 63.5 kg, last menstrual period 03/30/2020, SpO2 98 %.Body mass index is 19.53 kg/m.  General Appearance: Fairly Groomed  13/09/2019::  Good  Speech:  Clear and Coherent  Volume:  Normal  Mood:  Euthymic  Affect:  Congruent  Thought Process:  Coherent and Linear  Orientation:  Full (Time, Place, and Person)  Thought Content:  Logical  Suicidal Thoughts:  No  Homicidal Thoughts:  No  Memory:  Immediate;   Fair Recent;   Fair Remote;   Fair  Judgement:  Fair  Insight:  Present  Psychomotor Activity:  Normal  Concentration:  Good  Recall:  Good  Fund of Knowledge:Good  Language: Good  Akathisia:  Negative  Handed:  Right  AIMS (if indicated):     Assets:  Communication Skills Desire for Improvement Financial Resources/Insurance Housing Physical Health Resilience Social Support Transportation  Sleep:  Number of Hours: 8.25  Cognition: WNL  ADL's:  Intact        Have you used any form of tobacco in the last 30 days? (Cigarettes, Smokeless Tobacco, Cigars, and/or Pipes): No  Has this patient used any form of tobacco in the last 30 days? (Cigarettes, Smokeless Tobacco, Cigars, and/or Pipes) No  Blood Alcohol level:  Lab Results  Component Value Date   ETH <10 04/12/2020   ETH <10 03/03/2018    Metabolic Disorder Labs:  Lab Results  Component Value Date   HGBA1C 4.7 (L) 04/13/2020   MPG 88.19 04/13/2020   MPG 93.93 03/03/2018   Lab Results  Component Value Date   PROLACTIN 17.5 05/02/2014   Lab Results  Component Value Date   CHOL 122 04/13/2020   TRIG 50 04/13/2020   HDL 48 04/13/2020   CHOLHDL 2.5 04/13/2020   VLDL 10 04/13/2020   LDLCALC 64 04/13/2020   LDLCALC 90 03/03/2018    See Psychiatric  Specialty Exam and Suicide Risk Assessment completed by Attending Physician prior to discharge.  Discharge destination:  Home  Is patient on multiple antipsychotic therapies at discharge:  No   Has Patient had three or more failed trials of antipsychotic monotherapy by history:  No  Recommended Plan for Multiple Antipsychotic Therapies: NA  Discharge Instructions    Diet general   Complete by: As directed    Increase activity slowly   Complete by: As directed      Allergies as of 05/16/2020      Reactions  Amoxicillin Rash   REACTION: Rash REACTION: Rash      Medication List    STOP taking these medications   lamoTRIgine 25 MG tablet Commonly known as: LAMICTAL   prazosin 1 MG capsule Commonly known as: MINIPRESS   traZODone 100 MG tablet Commonly known as: DESYREL     TAKE these medications     Indication  benztropine 2 MG tablet Commonly known as: COGENTIN Take 2 tablets (4 mg total) by mouth 2 (two) times daily.  Indication: Extrapyramidal Reaction caused by Medications   fluvoxaMINE 100 MG tablet Commonly known as: LUVOX Take 1 tablet (100 mg total) by mouth in the morning AND 1.5 tablets (150 mg total) at bedtime. What changed: See the new instructions.  Indication: Obsessive Compulsive Disorder   haloperidol 10 MG tablet Commonly known as: HALDOL Take 1 tablet (10 mg total) by mouth at bedtime.  Indication: Psychosis   hydrOXYzine 50 MG tablet Commonly known as: ATARAX/VISTARIL Take 1 tablet (50 mg total) by mouth 3 (three) times daily as needed for anxiety.  Indication: Feeling Anxious       Follow-up Information    [facility] Go on 05/24/2020.   Why: Appointment is scheduled with Ms. Sun on 05/24/2020 at 6:00PM.  Please bring an updated copy of your insurance card.  Thanks! Contact information: [address] [place], [place] [address] Phone: [phone]  Fax: [phone]              Follow-up  recommendations:  Activity:  as tolerated Diet:  regular diet  Comments:  30-day scripts with one refill [facility] [address] per mother's request  Signed: Jesse SansMegan M Arsen Mangione, MD 05/16/2020, 9:45 AM

## 2020-05-16 NOTE — Plan of Care (Signed)
  Problem: Group Participation Goal: STG - Patient will engage in groups without prompting or encouragement from LRT x3 group sessions within 5 recreation therapy group sessions Description: STG - Patient will engage in groups without prompting or encouragement from LRT x3 group sessions within 5 recreation therapy group sessions Outcome: Completed/Met

## 2020-05-16 NOTE — [facility] Suicide Risk Assessment (Signed)
[facility] Discharge Suicide Risk Assessment   Principal Problem: Severe major depression, single episode, with psychotic features [facility]) Discharge Diagnoses: Principal Problem:   Severe major depression, single episode, with psychotic features (HCC) versus schizoaffective disorder, depressive type Active Problems:   Social anxiety disorder   OCD (obsessive compulsive disorder)   Total Time spent with patient: 30 minutes  Musculoskeletal: Strength & Muscle Tone: within normal limits Gait & Station: normal Patient leans: N/A  Psychiatric Specialty Exam: Review of Systems  Constitutional: Negative for appetite change and fatigue.  HENT: Negative for rhinorrhea and sore throat.   Eyes: Negative for photophobia and visual disturbance.  Respiratory: Negative for cough and shortness of breath.   Cardiovascular: Negative for chest pain and palpitations.  Gastrointestinal: Negative for constipation, diarrhea, nausea and vomiting.  Endocrine: Negative for cold intolerance and heat intolerance.  Genitourinary: Negative for difficulty urinating and dysuria.  Musculoskeletal: Negative for arthralgias and myalgias.  Skin: Negative for rash and wound.  Allergic/Immunologic: Negative for food allergies and immunocompromised state.  Neurological: Negative for dizziness and headaches.  Hematological: Negative for adenopathy. Does not bruise/bleed easily.  Psychiatric/Behavioral: Negative for agitation, behavioral problems, dysphoric mood, hallucinations and suicidal ideas. The patient is not nervous/anxious.     Blood pressure 96/68, pulse 94, temperature 98.1 F (36.7 C), temperature source Oral, resp. rate 17, height 5\' 11"  (1.803 m), weight 63.5 kg, last menstrual period 03/30/2020, SpO2 98 %.Body mass index is 19.53 kg/m.  General Appearance: Fairly Groomed  13/09/2019::  Good  Speech:  Clear and Coherent  Volume:  Normal  Mood:  Euthymic  Affect:  Congruent  Thought Process:  Coherent and  Linear  Orientation:  Full (Time, Place, and Person)  Thought Content:  Logical  Suicidal Thoughts:  No  Homicidal Thoughts:  No  Memory:  Immediate;   Fair Recent;   Fair Remote;   Fair  Judgement:  Fair  Insight:  Present  Psychomotor Activity:  Normal  Concentration:  Good  Recall:  Good  Fund of Knowledge:Good  Language: Good  Akathisia:  Negative  Handed:  Right  AIMS (if indicated):     Assets:  Communication Skills Desire for Improvement Financial Resources/Insurance Housing Physical Health Resilience Social Support Transportation  Sleep:  Number of Hours: 8.25  Cognition: WNL  ADL's:  Intact   Mental Status Per Nursing Assessment::   On Admission:  NA  Demographic Factors:  Adolescent or young adult, Caucasian and Unemployed  Loss Factors: NA  Historical Factors: NA  Risk Reduction Factors:   Sense of responsibility to family, Living with another person, especially a relative, Positive social support, Positive therapeutic relationship and Positive coping skills or problem solving skills  Continued Clinical Symptoms:  Depression:   Recent sense of peace/wellbeing Previous Psychiatric Diagnoses and Treatments  Cognitive Features That Contribute To Risk:  None    Suicide Risk:  Minimal: No identifiable suicidal ideation.  Patients presenting with no risk factors but with morbid ruminations; may be classified as minimal risk based on the severity of the depressive symptoms   Follow-up Information    [facility] Go on 05/24/2020.   Why: Appointment is scheduled with Ms. Sun on 05/24/2020 at 6:00PM.  Please bring an updated copy of your insurance card.  Thanks! Contact information: [address] [place], [place] Phone: [phone]  Fax: [phone]              Plan Of Care/Follow-up recommendations:  Activity:  as tolerated Diet:  regular diet  [name], MD 05/16/2020, 9:42 AM

## 2020-05-16 NOTE — Progress Notes (Signed)
Pt denies SI, HI and AVH. Pt was educated on dc plan and verbalizes understanding, Pt received dc packet and belongings. Torrie Mayers RN

## 2020-05-17 ENCOUNTER — Telehealth (HOSPITAL_COMMUNITY): Payer: Self-pay | Admitting: Licensed Clinical Social Worker

## 2020-05-17 NOTE — Telephone Encounter (Signed)
Cln recieved number for pt from [facility], [place] [facility], as there is no number in pt's chart. SWer provided number for pt's father [phone], and stated she had success with this number. Cln called number to f/u on PHP referral for pt. Cln left generic message requesting a call back at [phone] for information on a referral.

## 2020-05-31 ENCOUNTER — Other Ambulatory Visit (HOSPITAL_COMMUNITY): Payer: 59 | Attending: Psychiatry | Admitting: Licensed Clinical Social Worker

## 2020-05-31 ENCOUNTER — Other Ambulatory Visit: Payer: Self-pay

## 2020-05-31 DIAGNOSIS — F323 Major depressive disorder, single episode, severe with psychotic features: Secondary | ICD-10-CM

## 2020-05-31 DIAGNOSIS — F4010 Social phobia, unspecified: Secondary | ICD-10-CM

## 2020-05-31 NOTE — Psych (Signed)
Virtual Visit via Video Note  I connected with Robin Hess on 05/31/20 at  2:00 PM EST by a video enabled telemedicine application and verified that I am speaking with the correct person using two identifiers.  Location: Patient: patient home Provider: clinical home office   I discussed the limitations of evaluation and management by telemedicine and the availability of in person appointments. The patient expressed understanding and agreed to proceed.  History of Present Illness: Pt is referred to [facility] by [facility] psychiatric inpatient post-discharge of a 33 day stay for MDD with psychotic features. Pt discharged on 05/16/2020 and has been back in home environment approximately 2 weeks. Pt lives with family.    Observations/Objective: Pt presents with limited affect, no eye contact, paucity, low speech volume and is barely verbal. Pt offers small smile at times. Pt provides very little information and often uses nodding and hand motions instead of giving verbal responses. Pt's father, Noreene Boreman, presents in session with pt. Pt's father reports he is seeing some "gradual improvements" in pt and feels "deep seated depression and anxiety" are the root issue.  Pt states she remains medication compliant and has seen her therapist since discharge. Pt reports she did not enjoy group therapy in the hospital and does not want to do group therapy now either.  Pt denies AVH, delusions, and SI/HI.    Assessment and Plan: As pt declined group therapy, we will not move forward with PHP. Pt is recommended to continue individual therapy with her current provider. Pt's father states she does not have a Therapist, sports. Cln provided via email information for [facility], [facility] office, and [facility]. Pt's father reports alignment with the plan and states "I agree she knows what works for her."   Follow Up Instructions: Pt will continue therapy and schedule  with psychiatrist.    I discussed the assessment and treatment plan with the patient. The patient was provided an opportunity to ask questions and all were answered. The patient agreed with the plan and demonstrated an understanding of the instructions.   The patient was advised to call back or seek an in-person evaluation if the symptoms worsen or if the condition fails to improve as anticipated.  I provided 15 minutes of non-face-to-face time during this encounter.   Donia Guiles, LCSW
